# Patient Record
Sex: Female | Born: 1940
Health system: Southern US, Community
[De-identification: ages and names within clinical notes are randomized; demographics above are authoritative.]

## PROBLEM LIST (undated history)

## (undated) DIAGNOSIS — Z8719 Personal history of other diseases of the digestive system: Secondary | ICD-10-CM

## (undated) DIAGNOSIS — J189 Pneumonia, unspecified organism: Secondary | ICD-10-CM

## (undated) DIAGNOSIS — G43909 Migraine, unspecified, not intractable, without status migrainosus: Secondary | ICD-10-CM

## (undated) DIAGNOSIS — M5430 Sciatica, unspecified side: Secondary | ICD-10-CM

## (undated) DIAGNOSIS — R232 Flushing: Secondary | ICD-10-CM

## (undated) DIAGNOSIS — I2510 Atherosclerotic heart disease of native coronary artery without angina pectoris: Secondary | ICD-10-CM

## (undated) DIAGNOSIS — T8859XA Other complications of anesthesia, initial encounter: Secondary | ICD-10-CM

## (undated) DIAGNOSIS — T4145XA Adverse effect of unspecified anesthetic, initial encounter: Secondary | ICD-10-CM

## (undated) DIAGNOSIS — R011 Cardiac murmur, unspecified: Secondary | ICD-10-CM

## (undated) DIAGNOSIS — E162 Hypoglycemia, unspecified: Secondary | ICD-10-CM

## (undated) DIAGNOSIS — I10 Essential (primary) hypertension: Secondary | ICD-10-CM

## (undated) DIAGNOSIS — J45909 Unspecified asthma, uncomplicated: Secondary | ICD-10-CM

## (undated) DIAGNOSIS — E7800 Pure hypercholesterolemia, unspecified: Secondary | ICD-10-CM

## (undated) HISTORY — PX: CORONARY ARTERY BYPASS GRAFT: SHX141

## (undated) HISTORY — PX: SKIN LESION EXCISION: SHX2412

## (undated) HISTORY — PX: TONSILLECTOMY: SUR1361

## (undated) HISTORY — PX: BREAST BIOPSY: SHX20

---

## 1968-07-14 HISTORY — PX: APPENDECTOMY: SHX54

## 1995-07-15 HISTORY — PX: CORONARY ANGIOPLASTY WITH STENT PLACEMENT: SHX49

## 2005-08-18 ENCOUNTER — Encounter: Admission: RE | Admit: 2005-08-18 | Discharge: 2005-08-18 | Payer: Self-pay | Admitting: Internal Medicine

## 2005-10-23 ENCOUNTER — Other Ambulatory Visit: Admission: RE | Admit: 2005-10-23 | Discharge: 2005-10-23 | Payer: Self-pay | Admitting: Internal Medicine

## 2006-11-25 ENCOUNTER — Encounter: Admission: RE | Admit: 2006-11-25 | Discharge: 2006-11-25 | Payer: Self-pay | Admitting: Internal Medicine

## 2007-11-08 ENCOUNTER — Other Ambulatory Visit: Admission: RE | Admit: 2007-11-08 | Discharge: 2007-11-08 | Payer: Self-pay | Admitting: Internal Medicine

## 2007-12-28 ENCOUNTER — Encounter: Admission: RE | Admit: 2007-12-28 | Discharge: 2007-12-28 | Payer: Self-pay | Admitting: Internal Medicine

## 2009-01-10 ENCOUNTER — Encounter: Admission: RE | Admit: 2009-01-10 | Discharge: 2009-01-10 | Payer: Self-pay | Admitting: Internal Medicine

## 2009-11-07 ENCOUNTER — Other Ambulatory Visit: Admission: RE | Admit: 2009-11-07 | Discharge: 2009-11-07 | Payer: Self-pay | Admitting: Internal Medicine

## 2010-02-26 ENCOUNTER — Encounter: Admission: RE | Admit: 2010-02-26 | Discharge: 2010-02-26 | Payer: Self-pay | Admitting: Internal Medicine

## 2011-03-25 ENCOUNTER — Other Ambulatory Visit: Payer: Self-pay | Admitting: Internal Medicine

## 2011-03-25 DIAGNOSIS — Z1231 Encounter for screening mammogram for malignant neoplasm of breast: Secondary | ICD-10-CM

## 2011-04-17 ENCOUNTER — Ambulatory Visit
Admission: RE | Admit: 2011-04-17 | Discharge: 2011-04-17 | Disposition: A | Discharge status: Home / Self Care | Payer: 59 | Source: Ambulatory Visit | Attending: Internal Medicine | Admitting: Internal Medicine

## 2011-04-17 DIAGNOSIS — Z1231 Encounter for screening mammogram for malignant neoplasm of breast: Secondary | ICD-10-CM

## 2012-05-07 ENCOUNTER — Other Ambulatory Visit: Payer: Self-pay | Admitting: Internal Medicine

## 2012-05-07 DIAGNOSIS — Z1231 Encounter for screening mammogram for malignant neoplasm of breast: Secondary | ICD-10-CM

## 2012-05-17 ENCOUNTER — Other Ambulatory Visit (HOSPITAL_COMMUNITY): Payer: Self-pay | Admitting: Internal Medicine

## 2012-05-17 DIAGNOSIS — J45909 Unspecified asthma, uncomplicated: Secondary | ICD-10-CM

## 2012-05-21 ENCOUNTER — Ambulatory Visit (HOSPITAL_COMMUNITY)
Admission: RE | Admit: 2012-05-21 | Discharge: 2012-05-21 | Disposition: A | Discharge status: Home / Self Care | Payer: 59 | Source: Ambulatory Visit | Attending: Internal Medicine | Admitting: Internal Medicine

## 2012-05-21 DIAGNOSIS — J45909 Unspecified asthma, uncomplicated: Secondary | ICD-10-CM | POA: Insufficient documentation

## 2012-05-21 MED ORDER — ALBUTEROL SULFATE (5 MG/ML) 0.5% IN NEBU
2.5000 mg | INHALATION_SOLUTION | Freq: Once | RESPIRATORY_TRACT | Status: AC
  Administered 2012-05-21: 2.5 mg via RESPIRATORY_TRACT

## 2012-06-16 ENCOUNTER — Ambulatory Visit: Payer: 59

## 2012-07-28 ENCOUNTER — Ambulatory Visit: Payer: 59

## 2012-08-09 ENCOUNTER — Ambulatory Visit
Admission: RE | Admit: 2012-08-09 | Discharge: 2012-08-09 | Disposition: A | Discharge status: Home / Self Care | Payer: Medicare Other | Source: Ambulatory Visit | Attending: Internal Medicine | Admitting: Internal Medicine

## 2012-08-09 DIAGNOSIS — Z1231 Encounter for screening mammogram for malignant neoplasm of breast: Secondary | ICD-10-CM

## 2012-12-12 DIAGNOSIS — M5430 Sciatica, unspecified side: Secondary | ICD-10-CM

## 2012-12-12 HISTORY — DX: Sciatica, unspecified side: M54.30

## 2013-01-19 ENCOUNTER — Other Ambulatory Visit: Payer: Self-pay | Admitting: Internal Medicine

## 2013-01-19 DIAGNOSIS — R630 Anorexia: Secondary | ICD-10-CM

## 2013-01-19 DIAGNOSIS — R112 Nausea with vomiting, unspecified: Secondary | ICD-10-CM

## 2013-01-24 ENCOUNTER — Ambulatory Visit
Admission: RE | Admit: 2013-01-24 | Discharge: 2013-01-24 | Disposition: A | Discharge status: Home / Self Care | Payer: Medicare Other | Source: Ambulatory Visit | Attending: Internal Medicine | Admitting: Internal Medicine

## 2013-01-24 DIAGNOSIS — R630 Anorexia: Secondary | ICD-10-CM

## 2013-01-24 DIAGNOSIS — R112 Nausea with vomiting, unspecified: Secondary | ICD-10-CM

## 2013-02-01 ENCOUNTER — Encounter (HOSPITAL_COMMUNITY): Payer: Self-pay | Admitting: General Practice

## 2013-02-01 ENCOUNTER — Other Ambulatory Visit: Payer: Self-pay | Admitting: Internal Medicine

## 2013-02-01 ENCOUNTER — Inpatient Hospital Stay (HOSPITAL_COMMUNITY)
Admission: AD | Admit: 2013-02-01 | Discharge: 2013-02-03 | DRG: 384 | Disposition: A | Discharge status: Home / Self Care | Payer: Medicare Other | Source: Ambulatory Visit | Attending: Internal Medicine | Admitting: Internal Medicine

## 2013-02-01 ENCOUNTER — Ambulatory Visit
Admission: RE | Admit: 2013-02-01 | Discharge: 2013-02-01 | Disposition: A | Discharge status: Home / Self Care | Payer: Medicare Other | Source: Ambulatory Visit | Attending: Internal Medicine | Admitting: Internal Medicine

## 2013-02-01 DIAGNOSIS — Z79899 Other long term (current) drug therapy: Secondary | ICD-10-CM

## 2013-02-01 DIAGNOSIS — Z7982 Long term (current) use of aspirin: Secondary | ICD-10-CM

## 2013-02-01 DIAGNOSIS — D649 Anemia, unspecified: Secondary | ICD-10-CM | POA: Diagnosis present

## 2013-02-01 DIAGNOSIS — R1013 Epigastric pain: Secondary | ICD-10-CM | POA: Diagnosis present

## 2013-02-01 DIAGNOSIS — E785 Hyperlipidemia, unspecified: Secondary | ICD-10-CM | POA: Diagnosis present

## 2013-02-01 DIAGNOSIS — R1011 Right upper quadrant pain: Secondary | ICD-10-CM

## 2013-02-01 DIAGNOSIS — N182 Chronic kidney disease, stage 2 (mild): Secondary | ICD-10-CM | POA: Diagnosis present

## 2013-02-01 DIAGNOSIS — I10 Essential (primary) hypertension: Secondary | ICD-10-CM | POA: Diagnosis present

## 2013-02-01 DIAGNOSIS — I2510 Atherosclerotic heart disease of native coronary artery without angina pectoris: Secondary | ICD-10-CM | POA: Diagnosis present

## 2013-02-01 DIAGNOSIS — M899 Disorder of bone, unspecified: Secondary | ICD-10-CM

## 2013-02-01 DIAGNOSIS — E440 Moderate protein-calorie malnutrition: Secondary | ICD-10-CM | POA: Diagnosis present

## 2013-02-01 DIAGNOSIS — M8580 Other specified disorders of bone density and structure, unspecified site: Secondary | ICD-10-CM | POA: Diagnosis present

## 2013-02-01 DIAGNOSIS — I129 Hypertensive chronic kidney disease with stage 1 through stage 4 chronic kidney disease, or unspecified chronic kidney disease: Secondary | ICD-10-CM | POA: Diagnosis present

## 2013-02-01 DIAGNOSIS — J45909 Unspecified asthma, uncomplicated: Secondary | ICD-10-CM | POA: Diagnosis present

## 2013-02-01 DIAGNOSIS — K269 Duodenal ulcer, unspecified as acute or chronic, without hemorrhage or perforation: Principal | ICD-10-CM

## 2013-02-01 DIAGNOSIS — Z951 Presence of aortocoronary bypass graft: Secondary | ICD-10-CM

## 2013-02-01 DIAGNOSIS — IMO0002 Reserved for concepts with insufficient information to code with codable children: Secondary | ICD-10-CM

## 2013-02-01 HISTORY — DX: Personal history of other diseases of the digestive system: Z87.19

## 2013-02-01 HISTORY — DX: Other complications of anesthesia, initial encounter: T88.59XA

## 2013-02-01 HISTORY — DX: Cardiac murmur, unspecified: R01.1

## 2013-02-01 HISTORY — DX: Unspecified asthma, uncomplicated: J45.909

## 2013-02-01 HISTORY — DX: Pure hypercholesterolemia, unspecified: E78.00

## 2013-02-01 HISTORY — DX: Atherosclerotic heart disease of native coronary artery without angina pectoris: I25.10

## 2013-02-01 HISTORY — DX: Hypoglycemia, unspecified: E16.2

## 2013-02-01 HISTORY — DX: Pneumonia, unspecified organism: J18.9

## 2013-02-01 HISTORY — DX: Adverse effect of unspecified anesthetic, initial encounter: T41.45XA

## 2013-02-01 HISTORY — DX: Essential (primary) hypertension: I10

## 2013-02-01 HISTORY — DX: Sciatica, unspecified side: M54.30

## 2013-02-01 MED ORDER — ALBUTEROL SULFATE HFA 108 (90 BASE) MCG/ACT IN AERS
1.0000 | INHALATION_SPRAY | Freq: Four times a day (QID) | RESPIRATORY_TRACT | Status: DC | PRN
  Filled 2013-02-01: qty 6.7

## 2013-02-01 MED ORDER — SIMVASTATIN 40 MG PO TABS
40.0000 mg | ORAL_TABLET | Freq: Every day | ORAL | Status: DC
  Administered 2013-02-02: 40 mg via ORAL
  Filled 2013-02-01 (×3): qty 1

## 2013-02-01 MED ORDER — ATENOLOL 50 MG PO TABS
50.0000 mg | ORAL_TABLET | Freq: Every day | ORAL | Status: DC
  Administered 2013-02-01 – 2013-02-02 (×2): 50 mg via ORAL
  Filled 2013-02-01 (×4): qty 1

## 2013-02-01 MED ORDER — ACETAMINOPHEN 650 MG RE SUPP: 650.0000 mg | Freq: Four times a day (QID) | RECTAL | Status: DC | PRN

## 2013-02-01 MED ORDER — HEPARIN SODIUM (PORCINE) 5000 UNIT/ML IJ SOLN
5000.0000 [IU] | Freq: Three times a day (TID) | INTRAMUSCULAR | Status: DC
  Administered 2013-02-01 – 2013-02-02 (×2): 5000 [IU] via SUBCUTANEOUS
  Filled 2013-02-01 (×3): qty 1

## 2013-02-01 MED ORDER — IOHEXOL 300 MG/ML  SOLN: 100.0000 mL | Freq: Once | INTRAMUSCULAR | Status: AC | PRN

## 2013-02-01 MED ORDER — ONDANSETRON HCL 4 MG/2ML IJ SOLN
4.0000 mg | Freq: Four times a day (QID) | INTRAMUSCULAR | Status: DC | PRN
  Administered 2013-02-01: 4 mg via INTRAVENOUS
  Filled 2013-02-01: qty 2

## 2013-02-01 MED ORDER — ASPIRIN 325 MG PO TABS
325.0000 mg | ORAL_TABLET | Freq: Every day | ORAL | Status: DC
  Filled 2013-02-01: qty 1

## 2013-02-01 MED ORDER — SODIUM CHLORIDE 0.9 % IV SOLN
INTRAVENOUS | Status: DC
  Administered 2013-02-01: 19:00:00 via INTRAVENOUS
  Administered 2013-02-02: 75 mL/h via INTRAVENOUS
  Administered 2013-02-02: 06:00:00 via INTRAVENOUS

## 2013-02-01 MED ORDER — ACETAMINOPHEN 325 MG PO TABS: 650.0000 mg | ORAL_TABLET | Freq: Four times a day (QID) | ORAL | Status: DC | PRN

## 2013-02-01 MED ORDER — LOSARTAN POTASSIUM 50 MG PO TABS
50.0000 mg | ORAL_TABLET | Freq: Every day | ORAL | Status: DC
  Administered 2013-02-03: 50 mg via ORAL
  Filled 2013-02-01 (×2): qty 1

## 2013-02-01 MED ORDER — SODIUM CHLORIDE 0.9 % IJ SOLN: 3.0000 mL | Freq: Two times a day (BID) | INTRAMUSCULAR | Status: DC

## 2013-02-01 NOTE — Progress Notes (Signed)
Admitted to room 6n11 for office, A/ox4, oriented to room and surroundings, husband at bedside, doctor notified of arrival

## 2013-02-01 NOTE — H&P (Addendum)
[facility] History and Physical  [name] XBJ:[id number] DOB: [birth year] DOA: 02/01/2013  Referring physician: Dr. Donette Larry PCP: Georgann Housekeeper, MD  Specialists: None  Chief Complaint: Abdominal pain  HPI: Melissa Elliott is a 72 y.o. female has a past medical history significant for coronary artery disease status post 2 vessel bypass surgery in 1996, hypertension, hyperlipidemia, cervical disc disease, allergic rhinitis and mild asthma, osteopenia, CK-MB stage II mild, vitamin D deficiency presented to her PCPs office today with a chief complaint of epigastric discomfort and abdominal pain, on and off, associated with nausea, for the past 4 weeks. She states that her symptoms started when she traveled about 4 weeks ago. She was feeling poorly at that time, has been through urgent care in [place]. [place] and the one time she was found to be dehydrated and given IV fluids, and a second time she was told she has a urinary tract infections and was prescribed ciprofloxacin. Her symptoms have been persistent, she endorses poor by mouth intake, and a 7 pound associated weight loss in the past 4 weeks. Her pain is "crampy" in nature, now in particular relationship with food, it's in the mid epigastric area and feels that sometimes it is in the bilateral lower quadrants, "bandlike". Her abdominal pain it radiates into her back. Denies any fever or chills, however endorses she feels hot and cold all the time. Denies any diarrhea, endorses however constipation. Last bowel movement was yesterday. She denies any blood in the stool or blood in the urine. Denies lightheadedness and dizziness, just feels "weak".    Review of Systems: As per history of present illness, otherwise negative.  Past Medical History  Diagnosis Date  . Coronary artery disease   . Complication of anesthesia     "takes a long time to wakeup" (02/01/2013)  . High cholesterol     "on RX because of the bypass" (02/01/2013)  .  Hypertension   . Heart murmur   . Asthma   . Pneumonia ? 1970's    "once" (02/01/2013)  . Hypoglycemia     "that's something I have to watch constantly" (02/01/2013)  . H/O hiatal hernia   . Sciatic nerve pain 12/2012    "RLE" (02/01/2013)   Past Surgical History  Procedure Laterality Date  . Coronary artery bypass graft  ~ 1997    CABG X2  . Tonsillectomy  1940's  . Appendectomy  1970  . Breast lumpectomy Left ~ 1975  . Skin lesion excision Left 1960's    "upper arm; sore that never healed" (02/01/2013)   Social History:  reports that she has never smoked. She has never used smokeless tobacco. She reports that  drinks alcohol. She reports that she does not use illicit drugs.  Allergies  Allergen Reactions  . Ciprofloxacin Other (See Comments)    "dry heaves"  . Sulphadimidine (Sulfamethazine) Swelling  . Iodine Hives  . Eggs Or Egg-Derived Products Nausea And Vomiting    "sometimes I can eat eggs; sometimes I can't"  . Penicillins Rash    unknown    Family history Father deceased at 94 years old, MI Mother deceased at 21 years old, coronary artery disease, CABG 1 alive sister  Prior to Admission medications   Not on File  Multivitamin one tablet daily Calcium supplements 1 tablet daily Vitamin D 2000 units 1 tablet daily Albuterol 1 puff as needed Atenolol 50 mg daily Losartan 50 mg daily Aspirin 325 mg daily Nitroglycerin 0.4 mg as needed for chest pain Simvastatin  40 mg daily  Physical Exam: Filed Vitals:   02/01/13 1721  BP: 189/79  Pulse: 85  Temp: 97.8 F (36.6 C)  TempSrc: Oral  Resp: 17  SpO2: 99%     General:  No apparent distress  Eyes: PERRL, EOMI, no scleral icterus  ENT: moist oropharynx  Neck: supple, no JVD  Cardiovascular: regular rate without MRG; 2+ peripheral pulses  Respiratory: CTA biL, good air movement without wheezing, rhonchi or crackled  Abdomen: soft, non tender to palpation, positive bowel sounds, no guarding, no  rebound  Skin: no rashes  Musculoskeletal: no peripheral edema  Psychiatric: normal mood and affect  Neurologic: CN 2-12 grossly intact, MS 5/5 in all 4  Labs in PCP office 7/21  Blood work done yesterday 7/21 2014 showed a white count of 8.4, hemoglobin 11.4, platelets 430 Urinalysis with few bacteria, few epithelial cells, 5-20 WBC, trace leukoesterase, negative nitrates BNP with BUN 13, creatinine 1.27 (GFR 42), sodium 139, potassium 3.3, chloride 98, bicarbonate 32. Calcium 9.1 Liver enzymes AST 10, ALT 7, alkaline phosphatase 67, total bilirubin 0.3, albumin 2.7 Lipase 44  Radiological Exams on Admission: US Abdomen Limited Ruq  02/01/2013   *RADIOLOGY REPORT*  Clinical Data:  Right upper quadrant pain.  LIMITED ABDOMINAL ULTRASOUND - RIGHT UPPER QUADRANT  Comparison:  None.  Findings:  Gallbladder:  No stones or wall thickening.  Negative sonographic Murphy's.  Common bile duct:  Upper limits normal in caliber at 6 mm.  No visible filling defects.  Liver:  Normal size and echotexture.  No focal abnormality.  No biliary ductal dilatation.  IMPRESSION: Unremarkable study.                    Original Report Authenticated By: Charlett Nose, M.D.    EKG: Pending  Assessment/Plan Active Problems:   Abdominal pain, epigastric   Hx of CABG   HTN (hypertension)   HLD (hyperlipidemia)   Osteopenia    Abdominal pain - unrevealing workup so far. Patient underwent a right upper quadrant ultrasound which was negative for cholelithiasis or any other pathology. Blood work shows normal liver function tests, with a normal lipase. We'll pursue with a CT abdomen and pelvis with oral contrast; patient has mild CKD as well as hives with iodine and would like to avoid contrast if possible. IV fluids, n.p.o. and advance diet as tolerated. - Consider GI consult if CT is negative as well. Hypertension - restart medications Hyperlipidemia - restart home medications History of CABG - on telemetry DVT  prophylaxis - heparin subcutaneous   Code Status: Presumed full  Family Communication: Husband at bedside  Disposition Plan: Inpatient  Time spent: 61  Costin M. Elvera Lennox, MD [facility] Pager [phone]  If 7PM-7AM, please contact night-coverage [email or web address] Password [facility] 02/01/2013, 6:08 PM

## 2013-02-02 ENCOUNTER — Encounter (HOSPITAL_COMMUNITY): Payer: Self-pay | Admitting: Radiology

## 2013-02-02 ENCOUNTER — Encounter (HOSPITAL_COMMUNITY)
Admission: AD | Disposition: A | Discharge status: Home / Self Care | Payer: Self-pay | Source: Ambulatory Visit | Attending: Internal Medicine

## 2013-02-02 ENCOUNTER — Inpatient Hospital Stay (HOSPITAL_COMMUNITY): Payer: Medicare Other

## 2013-02-02 DIAGNOSIS — K269 Duodenal ulcer, unspecified as acute or chronic, without hemorrhage or perforation: Secondary | ICD-10-CM

## 2013-02-02 HISTORY — PX: ESOPHAGOGASTRODUODENOSCOPY: SHX5428

## 2013-02-02 LAB — CBC
HCT: 29.1 % — ABNORMAL LOW (ref 36.0–46.0)
Hemoglobin: 9.5 g/dL — ABNORMAL LOW (ref 12.0–15.0)
MCHC: 32.6 g/dL (ref 30.0–36.0)

## 2013-02-02 LAB — COMPREHENSIVE METABOLIC PANEL
ALT: 6 U/L (ref 0–35)
Calcium: 8.8 mg/dL (ref 8.4–10.5)
GFR calc Af Amer: 69 mL/min — ABNORMAL LOW (ref >90)
Glucose, Bld: 119 mg/dL — ABNORMAL HIGH (ref 70–99)
Sodium: 139 mEq/L (ref 135–145)
Total Protein: 5.6 g/dL — ABNORMAL LOW (ref 6.0–8.3)

## 2013-02-02 SURGERY — EGD (ESOPHAGOGASTRODUODENOSCOPY)
Anesthesia: Moderate Sedation

## 2013-02-02 MED ORDER — PANTOPRAZOLE SODIUM 40 MG IV SOLR
40.0000 mg | Freq: Two times a day (BID) | INTRAVENOUS | Status: DC
  Administered 2013-02-02 (×2): 40 mg via INTRAVENOUS
  Filled 2013-02-02 (×4): qty 40

## 2013-02-02 MED ORDER — MIDAZOLAM HCL 5 MG/ML IJ SOLN
INTRAMUSCULAR | Status: AC
  Filled 2013-02-02: qty 2

## 2013-02-02 MED ORDER — MIDAZOLAM HCL 10 MG/2ML IJ SOLN
INTRAMUSCULAR | Status: DC | PRN
  Administered 2013-02-02: 2 mg via INTRAVENOUS
  Administered 2013-02-02: 1 mg via INTRAVENOUS

## 2013-02-02 MED ORDER — DIPHENHYDRAMINE HCL 50 MG/ML IJ SOLN
INTRAMUSCULAR | Status: AC
  Filled 2013-02-02: qty 1

## 2013-02-02 MED ORDER — FENTANYL CITRATE 0.05 MG/ML IJ SOLN
INTRAMUSCULAR | Status: AC
  Filled 2013-02-02: qty 4

## 2013-02-02 MED ORDER — BOOST / RESOURCE BREEZE PO LIQD
1.0000 | Freq: Every day | ORAL | Status: DC | PRN
  Filled 2013-02-02: qty 1

## 2013-02-02 MED ORDER — FENTANYL CITRATE 0.05 MG/ML IJ SOLN
INTRAMUSCULAR | Status: DC | PRN
  Administered 2013-02-02 (×2): 25 ug via INTRAVENOUS

## 2013-02-02 MED ORDER — SODIUM CHLORIDE 0.9 % IV SOLN
INTRAVENOUS | Status: DC
  Administered 2013-02-02: 500 mL via INTRAVENOUS

## 2013-02-02 MED ORDER — SODIUM CHLORIDE 0.9 % IV SOLN: Freq: Once | INTRAVENOUS | Status: DC

## 2013-02-02 MED ORDER — SODIUM CHLORIDE 0.9 % IV SOLN
INTRAVENOUS | Status: DC
  Administered 2013-02-02: 50 mL/h via INTRAVENOUS

## 2013-02-02 MED ORDER — BUTAMBEN-TETRACAINE-BENZOCAINE 2-2-14 % EX AERO
INHALATION_SPRAY | CUTANEOUS | Status: DC | PRN
  Administered 2013-02-02: 2 via TOPICAL

## 2013-02-02 NOTE — Op Note (Addendum)
[facility] [address] [place], [address]   ENDOSCOPY PROCEDURE REPORT  PATIENT: Melissa, Elliott  MR#: [id number] BIRTHDATE: [birth year] , 71  yrs. old GENDER: Female ENDOSCOPIST: Wandalee Ferdinand, MD REFERRED BY: PROCEDURE DATE:  02/02/2013 PROCEDURE:   EGD with bx ASA CLASS: 3 INDICATIONS: epigastric pain MEDICATIONS: Fentanyl 50 micro grams iv, Versed 3 mg iv TOPICAL ANESTHETIC: Cetacaine spray  DESCRIPTION OF PROCEDURE:   After the risks benefits and alternatives of the procedure were thoroughly explained, informed consent was obtained.  The Pentax Gastroscope [id number]  endoscope was introduced through the mouth and advanced to the duodenum , limited by Without limitations.   The instrument was slowly withdrawn as the mucosa was fully examined.      FINDINGS:  Esophagus: Normal  Stomach: Small hiatal hernia otherwise normal  Duodenum: 7mm ulcer in the bulb seen as soon as scope entered the bulb see image 003. Surrounding edematous mucosa. Just past this smaller ulcer was a large ulcer (image 004), but there was partial obstruction of the bulb and all of this larger ulcer could not be seen. The scope could not be advanced into the second portion of the duodenum.  Biopsies of duodenum and stomach obtained.  COMPLICATIONS: None  ENDOSCOPIC IMPRESSION: See above   RECOMMENDATIONS: PPI therapy. Check bx for H pylori.      _______________________________ Rosalie DoctorWandalee Ferdinand, MD 02/02/2013 12:35 PM

## 2013-02-02 NOTE — Progress Notes (Signed)
Patient more awake. Preparing to eat dinner. Denies any pain /nausea.

## 2013-02-02 NOTE — Progress Notes (Signed)
INITIAL NUTRITION ASSESSMENT  DOCUMENTATION CODES Per approved criteria  -Non-severe (moderate) malnutrition in the context of acute illness or injury   INTERVENTION:  Resource Breeze daily PRN (250 kcals, 9 gm protein per 8 fl oz carton) RD to follow for nutrition care plan  NUTRITION DIAGNOSIS: Inadequate oral intake related to abdominal pain, poor appetite as evidenced by patient report  Goal: Oral intake with meals & supplements to meet >/= 90% of estimated nutrition needs  Monitor:  PO & supplemental intake, weight, labs, I/O's  Reason for Assessment: Malnutrition Screening Tool Report  72 y.o. female  Admitting Dx: Abdominal pain, epigastric  ASSESSMENT: Patient admitted to the hospital because of a one-month history of ongoing intermittent epigastric abdominal pain; CT scan of the abdomen was done which showed some thickening in the proximal duodenum which could be peptic in nature.   Patient reports a poor appetite x 1 month; reports she would only consume ~ 1 meal per day; she would go ahead and eat her "safe" foods (shakes, pudding); she also states she's lost 7 lbs in this time frame (5%) ---> for EGD today to further evaluate; she is agreeable to Resource Breeze PRN.  Patient meets criteria for non-severe (moderate) malnutrition in the context of acute illness or injury as evidenced by < 75% intake of estimated energy requirement for > 7 days and 5% weight loss x 1 month.  Height: Ht Readings from Last 1 Encounters:  02/01/13 5\' 4"  (1.626 m)    Weight: Wt Readings from Last 1 Encounters:  02/01/13 123 lb (55.792 kg)    Ideal Body Weight: 120 lb  % Ideal Body Weight: 102%  Wt Readings from Last 10 Encounters:  02/01/13 123 lb (55.792 kg)  02/01/13 123 lb (55.792 kg)    Usual Body Weight: ~ 130 lb  % Usual Body Weight: 95%  BMI:  Body mass index is 21.1 kg/(m^2).  Estimated Nutritional Needs: Kcal: 1500-1700 Protein: 70-80 gm Fluid: 1.5-1.7  L  Skin: Intact  Diet Order: Clear Liquid  EDUCATION NEEDS: -No education needs identified at this time   Intake/Output Summary (Last 24 hours) at 02/02/13 1129 Last data filed at 02/02/13 0500  Gross per 24 hour  Intake 996.67 ml  Output      0 ml  Net 996.67 ml    Labs:   Recent Labs Lab 02/02/13 0440  NA 139  K 4.5  CL 104  CO2 28  BUN 9  CREATININE 0.95  CALCIUM 8.8  GLUCOSE 119*    Scheduled Meds: . [facility] HOLD] atenolol  50 mg Oral Daily  . [MAR HOLD] losartan  50 mg Oral Daily  . [MAR HOLD] pantoprazole (PROTONIX) IV  40 mg Intravenous Q12H  . [facility] HOLD] simvastatin  40 mg Oral q1800  . [facility] HOLD] sodium chloride  3 mL Intravenous Q12H    Continuous Infusions: . sodium chloride 75 mL/hr at 02/02/13 4010    Past Medical History  Diagnosis Date  . Coronary artery disease   . Complication of anesthesia     "takes a long time to wakeup" (02/01/2013)  . High cholesterol     "on RX because of the bypass" (02/01/2013)  . Hypertension   . Heart murmur   . Asthma   . Pneumonia ? 1970's    "once" (02/01/2013)  . Hypoglycemia     "that's something I have to watch constantly" (02/01/2013)  . H/O hiatal hernia   . Sciatic nerve pain 12/2012    "  RLE" (02/01/2013)    Past Surgical History  Procedure Laterality Date  . Coronary artery bypass graft  ~ 1997    CABG X2  . Tonsillectomy  1940's  . Appendectomy  1970  . Breast lumpectomy Left ~ 1975  . Skin lesion excision Left 1960's    "upper arm; sore that never healed" (02/01/2013)    Maureen Chatters, RD, LDN Pager #: [phone] After-Hours Pager #: [phone]

## 2013-02-02 NOTE — Progress Notes (Signed)
UR completed 

## 2013-02-02 NOTE — Progress Notes (Signed)
Subjective: Pt with upper Abdomen pain, nausea  Objective: Vital signs in last 24 hours: Temp:  [97.8 F (36.6 C)-98.5 F (36.9 C)] 98.5 F (36.9 C) (07/23 0500) Pulse Rate:  [69-91] 69 (07/23 0500) Resp:  [17] 17 (07/23 0500) BP: (126-189)/(68-79) 126/75 mmHg (07/23 0500) SpO2:  [94 %-99 %] 94 % (07/23 0500) Weight:  [55.792 kg (123 lb)] 55.792 kg (123 lb) (07/22 1816) Weight change:  Last BM Date: 01/31/13  Intake/Output from previous day: 07/22 0701 - 07/23 0700 In: 996.7 [I.V.:996.7] Out: -  Intake/Output this shift:    General appearance: alert Resp: clear to auscultation bilaterally Cardio: regular rate and rhythm GI: abnormal findings:  epigastric tenderness.  Lab Results:  Recent Labs  02/02/13 0440  WBC 9.1  HGB 9.5*  HCT 29.1*  PLT 374   BMET  Recent Labs  02/02/13 0440  NA 139  K 4.5  CL 104  CO2 28  GLUCOSE 119*  BUN 9  CREATININE 0.95  CALCIUM 8.8    Studies/Results: Ct Abdomen Pelvis Wo Contrast  02/02/2013   *RADIOLOGY REPORT*  Clinical Data: Diffuse abdominal pain for 4 weeks.  I am allergy.  CT ABDOMEN AND PELVIS WITHOUT CONTRAST  Technique:  Multidetector CT imaging of the abdomen and pelvis was performed following the standard protocol without intravenous contrast.  Comparison: Ultrasound abdomen 02/01/2013  Findings: Emphysematous changes and fibrosis in the lung bases. Calcification of the mitral valve annulus.  Postoperative changes in the mediastinum.  Calcified granulomas in the spleen.  There is thickening of the wall of the duodenum and duodenal bulb region with focal contrast collection suggesting ulcer. Mild fullness of the adjacent head of the pancreas with loss of fat planes between the pancreas of the duodenum.  This is likely secondary inflammation.  Focal pancreatitis or duodenitis are not excluded.  Mass lesion felt to be less likely.  No pancreatic ductal dilatation.  No bile duct dilatation.  Consider endoscopy for further  evaluation if clinically indicated.  The unenhanced appearance of the liver, gallbladder, adrenal glands, kidneys, inferior vena cava, and retroperitoneal lymph nodes is unremarkable.  Calcification of the abdominal aorta without aneurysm.  The gastric wall does not appeared thickened. Small bowel are not distended.  Contrast material flows to the rectum without evidence of bowel obstruction.  No colonic wall thickening or distension.  No free air or free fluid in the abdomen.  Abdominal wall musculature appears intact.  Pelvis:  Uterus and ovaries are not enlarged.  No bladder wall thickening.  No free or loculated pelvic fluid collections.  No diverticulitis.  Appendix is normal.  No significant pelvic lymphadenopathy.  Normal alignment of the lumbar spine.  IMPRESSION: Suggestion of thickening and inflammatory process involving the proximal duodenum probably due to peptic ulcer disease.  Focal pancreatitis or duodenitis not excluded.  Mass lesion less likely.   Original Report Authenticated By: Burman Nieves, M.D.   US Abdomen Limited Ruq  02/01/2013   *RADIOLOGY REPORT*  Clinical Data:  Right upper quadrant pain.  LIMITED ABDOMINAL ULTRASOUND - RIGHT UPPER QUADRANT  Comparison:  None.  Findings:  Gallbladder:  No stones or wall thickening.  Negative sonographic Murphy's.  Common bile duct:  Upper limits normal in caliber at 6 mm.  No visible filling defects.  Liver:  Normal size and echotexture.  No focal abnormality.  No biliary ductal dilatation.  IMPRESSION: Unremarkable study.  Original Report Authenticated By: Charlett Nose, M.D.    Medications: I have reviewed the patient's current medications.  Assessment/Plan: Abdominal pain/ Nausea- UGI and u/s out pt negative- worsing pain with anemia CT scan- Imlafmatory Change - proable- DU/ deudinitis/ PUD- less likely mass; lab normal- lipase and LFT- out pt normal Pt was on Mobic and ASA   Plan: d/c ASA/ GI - for EGD Start on  [employer] today. Anemia: low Bleed- Repeat CBC HTN/ CAD/ BP med    LOS: 1 day   Verdean Murin 02/02/2013, 7:33 AM

## 2013-02-02 NOTE — Progress Notes (Signed)
Patient drowsy since endoscopy earlier. Arouses to voice and gentle shake. Vital signs stable. No apparent distress. Husband at bedside.RN spoke with Dr. Evette Cristal. Discharge order entered incorrectly.

## 2013-02-02 NOTE — Consult Note (Signed)
Subjective:   HPI  The patient is a 72 year old female who was admitted to the hospital because of a one-month history of ongoing intermittent epigastric abdominal pain. The pain seems to be worse after eating. She states that she has not tried any medication for this pain. An upper GI series showed a small hiatal hernia but otherwise was negative. A recent abdominal ultrasound was also negative and specifically no gallstones were seen. A CT scan of the abdomen was done which showed some thickening in the proximal duodenum which could be peptic in nature. We are asked to see her to further evaluate. She states that she had a colonoscopy done about 4 years ago and nothing serious was found. She states that she has not been feeling that well and has a poor appetite and also has lost about 7 pounds in the past month.  Review of Systems   Past Medical History  Diagnosis Date  . Coronary artery disease   . Complication of anesthesia     "takes a long time to wakeup" (02/01/2013)  . High cholesterol     "on RX because of the bypass" (02/01/2013)  . Hypertension   . Heart murmur   . Asthma   . Pneumonia ? 1970's    "once" (02/01/2013)  . Hypoglycemia     "that's something I have to watch constantly" (02/01/2013)  . H/O hiatal hernia   . Sciatic nerve pain 12/2012    "RLE" (02/01/2013)   Past Surgical History  Procedure Laterality Date  . Coronary artery bypass graft  ~ 1997    CABG X2  . Tonsillectomy  1940's  . Appendectomy  1970  . Breast lumpectomy Left ~ 1975  . Skin lesion excision Left 1960's    "upper arm; sore that never healed" (02/01/2013)   History   Social History  . Marital Status: Married    Spouse Name: N/A    Number of Children: N/A  . Years of Education: N/A   Occupational History  . Not on file.   Social History Main Topics  . Smoking status: Never Smoker   . Smokeless tobacco: Never Used  . Alcohol Use: Yes     Comment: 02/01/2013 "glass of wine maybe  once/month"  . Drug Use: No  . Sexually Active: Not Currently   Other Topics Concern  . Not on file   Social History Narrative  . No narrative on file   family history is not on file. Current facility-administered medications:0.9 %  sodium chloride infusion, , Intravenous, Continuous, Georgann Housekeeper, MD, Last Rate: 75 mL/hr at 02/02/13 0737;  acetaminophen (TYLENOL) suppository 650 mg, 650 mg, Rectal, Q6H PRN, Pamella Pert, MD;  acetaminophen (TYLENOL) tablet 650 mg, 650 mg, Oral, Q6H PRN, Pamella Pert, MD albuterol (PROVENTIL HFA;VENTOLIN HFA) 108 (90 BASE) MCG/ACT inhaler 1-2 puff, 1-2 puff, Inhalation, Q6H PRN, Pamella Pert, MD;  atenolol (TENORMIN) tablet 50 mg, 50 mg, Oral, Daily, Pamella Pert, MD, 50 mg at 02/01/13 2140;  losartan (COZAAR) tablet 50 mg, 50 mg, Oral, Daily, Pamella Pert, MD;  ondansetron (ZOFRAN) injection 4 mg, 4 mg, Intravenous, QID PRN, Marden Noble, MD, 4 mg at 02/01/13 2140 pantoprazole (PROTONIX) injection 40 mg, 40 mg, Intravenous, Q12H, Georgann Housekeeper, MD, 40 mg at 02/02/13 1004;  simvastatin (ZOCOR) tablet 40 mg, 40 mg, Oral, q1800, Costin Gherghe, MD;  sodium chloride 0.9 % injection 3 mL, 3 mL, Intravenous, Q12H, Pamella Pert, MD Allergies  Allergen Reactions  . Ciprofloxacin Other (See Comments)    "  dry heaves"  . Iodine Hives  . Sulfa Antibiotics   . Eggs Or Egg-Derived Products Nausea And Vomiting    "sometimes I can eat eggs; sometimes I can't"  . Penicillins Rash    unknown     Objective:     BP 126/75  Pulse 69  Temp(Src) 98.5 F (36.9 C) (Oral)  Resp 17  Ht 5\' 4"  (1.626 m)  Wt 55.792 kg (123 lb)  BMI 21.1 kg/m2  SpO2 94%  She is in no distress  Heart regular rhythm  Lungs clear  Abdomen: Bowel sounds normal, soft, mild tenderness in epigastric area  Laboratory No components found with this basename: d1      Assessment:     Epigastric pain      Plan:     We will go ahead and plan EGD today to further evaluate  the upper GI tract. Recommend PPI therapy

## 2013-02-03 ENCOUNTER — Encounter (HOSPITAL_COMMUNITY): Payer: Self-pay | Admitting: Gastroenterology

## 2013-02-03 LAB — CBC
HCT: 29.7 % — ABNORMAL LOW (ref 36.0–46.0)
MCH: 28.7 pg (ref 26.0–34.0)
MCHC: 33 g/dL (ref 30.0–36.0)
MCV: 86.8 fL (ref 78.0–100.0)
RDW: 13.5 % (ref 11.5–15.5)

## 2013-02-03 LAB — BASIC METABOLIC PANEL
BUN: 9 mg/dL (ref 6–23)
Calcium: 9 mg/dL (ref 8.4–10.5)
Chloride: 106 mEq/L (ref 96–112)
Creatinine, Ser: 0.97 mg/dL (ref 0.50–1.10)
GFR calc Af Amer: 67 mL/min — ABNORMAL LOW (ref >90)
GFR calc non Af Amer: 58 mL/min — ABNORMAL LOW (ref >90)

## 2013-02-03 MED ORDER — PANTOPRAZOLE SODIUM 40 MG PO TBEC: 40.0000 mg | DELAYED_RELEASE_TABLET | Freq: Two times a day (BID) | ORAL | Status: DC

## 2013-02-03 NOTE — Progress Notes (Signed)
Md on call notified that pt lung sound had crackles at the lower bases no shortness of breath or edema at this time new orders were received will continue to monitor. Ilean Skill LPN

## 2013-02-03 NOTE — Progress Notes (Signed)
DC home with husband, verbally understood dc instructions, no questions ask

## 2013-02-03 NOTE — Discharge Summary (Signed)
Physician Discharge Summary  Patient ID: Melissa Elliott MRN: [id number] DOB/AGE: April 15, 1941 72 y.o.  Admit date: 02/01/2013 Discharge date: 02/03/2013  Admission Diagnoses:  Discharge Diagnoses:  Principal Problem:   Abdominal pain, epigastric Duodenal ulcers Anemia Active Problems:   Hx of CABG   HTN (hypertension)   HLD (hyperlipidemia)   Osteopenia   Duodenal ulcer   Discharged Condition: good  Hospital Course: 72 years old female admitted with epigastric pain nausea worsening. Over the last 3 weeks. She was seen in the office with workup including upper GI study negative, ultrasound of the gallbladder negative patient continues to have worsening pain in the upper abdomen and the back with poor p.o. Intake and nausea and weakness. Recently treated for UTI. Problem #1: abdominal pain: patient underwent CT scan of the abdomen pelvic on admission. CT scan findings showed duodenal thickening possibly ulcer versus duodenitis. Otherwise CT scan was negative for abdomen and pelvic GI consult was obtained patient was started on IV fluids her blood counts showed normal white cell count hemoglobin of 9.5. Blood chemistries was normal.; She had no melena or black stools. Endoscopy was performed- it showed duodenal ulcer one small one in one large one with some mucosal swelling and some partial obstruction of the duodenum- biopsies obtained, H. Pylori obtained- results pending She was started on IV Protonix twice a day After the endoscopy patient did well no large amount abdominal pain she was started on full liquid diet tolerated that well Continue Protonix p.o. For minimum of 6 weeks Aspirin was discontinued Followup with GI for biopsy and if pylori results followup May need repeat endoscopy if appropriate per GI Problem #2 anemia patient's repeat hemoglobin was 9.8 the evidence of any active bleed. Problem #3 hypertension CAD continue blood pressure medications.; Aspirin be on hold for  next 4-6 weeks  Consults: GI  Significant Diagnostic Studies: labs: Tuberculate cracking of 0.97 BUN of 9 glucose 104 pressure 4.5 WBC count 7.2 hemoglobin 9.8 albumin 2.8 TSH 1.3, radiology: CT scan: lower lung scarring duodenal wall thickening otherwise negative and endoscopy: gastroscopy: duodenal ulcers  Treatments: IV hydration  Discharge Exam: Blood pressure 149/50, pulse 70, temperature 97.7 F (36.5 C), temperature source Oral, resp. rate 18, height 5\' 4"  (1.626 m), weight 55.792 kg (123 lb), SpO2 98.00%. General appearance: alert Resp: clear to auscultation bilaterally Cardio: regular rate and rhythm GI: soft, non-tender; bowel sounds normal; no masses,  no organomegaly  Disposition: home  Discharge Orders   Future Orders Complete By Expires     Diet - low sodium heart healthy  As directed     Increase activity slowly  As directed         Medication List    STOP taking these medications       aspirin EC 325 MG tablet      TAKE these medications       albuterol 108 (90 BASE) MCG/ACT inhaler  Commonly known as:  PROVENTIL HFA;VENTOLIN HFA  Inhale 2 puffs into the lungs every 6 (six) hours as needed for wheezing.     atenolol 50 MG tablet  Commonly known as:  TENORMIN  Take 50 mg by mouth daily.     losartan 50 MG tablet  Commonly known as:  COZAAR  Take 50 mg by mouth daily.     nitroGLYCERIN 0.4 MG SL tablet  Commonly known as:  NITROSTAT  Place 0.4 mg under the tongue every 5 (five) minutes as needed for chest pain.  pantoprazole 40 MG tablet  Commonly known as:  PROTONIX  Take 1 tablet (40 mg total) by mouth 2 (two) times daily.     simvastatin 40 MG tablet  Commonly known as:  ZOCOR  Take 40 mg by mouth every evening.           Follow-up Information   Follow up with Georgann Housekeeper, MD In 2 weeks.   Contact information:   [address] [place] [address] [phone]       Signed: Georgann Housekeeper 02/03/2013, 8:14  AM

## 2013-05-10 ENCOUNTER — Other Ambulatory Visit: Payer: Self-pay | Admitting: Gastroenterology

## 2013-06-18 ENCOUNTER — Encounter: Payer: Self-pay | Admitting: *Deleted

## 2013-06-18 ENCOUNTER — Encounter: Payer: Self-pay | Admitting: Interventional Cardiology

## 2013-06-20 ENCOUNTER — Ambulatory Visit (INDEPENDENT_AMBULATORY_CARE_PROVIDER_SITE_OTHER): Payer: Medicare Other | Admitting: Interventional Cardiology

## 2013-06-20 ENCOUNTER — Encounter: Payer: Self-pay | Admitting: Interventional Cardiology

## 2013-06-20 VITALS — BP 138/80 | HR 82 | Ht 64.0 in | Wt 128.0 lb

## 2013-06-20 DIAGNOSIS — I2510 Atherosclerotic heart disease of native coronary artery without angina pectoris: Secondary | ICD-10-CM

## 2013-06-20 DIAGNOSIS — D649 Anemia, unspecified: Secondary | ICD-10-CM | POA: Insufficient documentation

## 2013-06-20 DIAGNOSIS — I10 Essential (primary) hypertension: Secondary | ICD-10-CM

## 2013-06-20 DIAGNOSIS — E785 Hyperlipidemia, unspecified: Secondary | ICD-10-CM

## 2013-06-20 NOTE — Patient Instructions (Signed)
Your physician recommends that you continue on your current medications as directed. Please refer to the Current Medication list given to you today.  Your physician wants you to follow-up in: 1 year with Dr. Varanasi. You will receive a reminder letter in the mail two months in advance. If you don't receive a letter, please call our office to schedule the follow-up appointment.  

## 2013-06-20 NOTE — Progress Notes (Signed)
Patient ID: Melissa Elliott, female   DOB: 1941/03/14, 72 y.o.   MRN: [id number]    [address] [place], [place]  [address] Phone: [phone] Fax:  [phone]  Date:  06/20/2013   ID:  Melissa Elliott, DOB [birth year], MRN [id number]  PCP:  Georgann Housekeeper, MD      History of Present Illness: Melissa Elliott is a 72 y.o. female with CAD. CABG in 1996. BPs at home are usually 130-140 / 60s.  She had several stomach ulcers which were healing by EGD.  She has had sciatica in the right leg.   CAD/ASCVD:  Melissa Elliott regularly, 3x/week x 30-40 minutes. No problems with that. She does light weights and situps. Denies : Chest pain.  Dizziness.  Dyspnea on exertion.  Leg edema.  Nitroglycerin.  Orthopnea.  Palpitations.  Paroxysmal nocturnal dyspnea.  Syncope.     Wt Readings from Last 3 Encounters:  06/20/13 128 lb (58.06 kg)  02/01/13 123 lb (55.792 kg)  02/01/13 123 lb (55.792 kg)     Past Medical History  Diagnosis Date  . Coronary artery disease   . Complication of anesthesia     "takes a long time to wakeup" (02/01/2013)  . High cholesterol     "on RX because of the bypass" (02/01/2013)  . Hypertension   . Heart murmur   . Asthma   . Pneumonia ? 1970's    "once" (02/01/2013)  . Hypoglycemia     "that's something I have to watch constantly" (02/01/2013)  . H/O hiatal hernia   . Sciatic nerve pain 12/2012    "RLE" (02/01/2013)    Current Outpatient Prescriptions  Medication Sig Dispense Refill  . albuterol (PROVENTIL HFA;VENTOLIN HFA) 108 (90 BASE) MCG/ACT inhaler Inhale 2 puffs into the lungs every 6 (six) hours as needed for wheezing.      Marland Kitchen atenolol (TENORMIN) 50 MG tablet Take 50 mg by mouth daily.      Marland Kitchen losartan (COZAAR) 50 MG tablet Take 50 mg by mouth daily.      . nitroGLYCERIN (NITROSTAT) 0.4 MG SL tablet Place 0.4 mg under the tongue every 5 (five) minutes as needed for chest pain.      . pantoprazole (PROTONIX) 40 MG tablet Take 1 tablet (40 mg  total) by mouth 2 (two) times daily.  60 tablet  6  . simvastatin (ZOCOR) 40 MG tablet Take 40 mg by mouth every evening.       No current facility-administered medications for this visit.    Allergies:    Allergies  Allergen Reactions  . Ciprofloxacin Other (See Comments)    "dry heaves"  . Iodine Hives  . Sulfa Antibiotics   . Eggs Or Egg-Derived Products Nausea And Vomiting    "sometimes I can eat eggs; sometimes I can't"  . Penicillins Rash    unknown    Social History:  The patient  reports that she has never smoked. She has never used smokeless tobacco. She reports that she drinks alcohol. She reports that she does not use illicit drugs.   Family History:  The patient's family history includes CAD in her mother; Heart attack in her father.   ROS:  Please see the history of present illness.  No nausea, vomiting.  No fevers, chills.  No focal weakness.  No dysuria.   All other systems reviewed and negative.   PHYSICAL EXAM: VS:  BP 138/80  Pulse 82  Ht 5\' 4"  (1.626 m)  Wt 128 lb (  58.06 kg)  BMI 21.96 kg/m2  SpO2 96% Well nourished, well developed, in no acute distress HEENT: normal Neck: no JVD, no carotid bruits Cardiac:  normal S1, S2; RRR;  Lungs:  clear to auscultation bilaterally, no wheezing, rhonchi or rales Abd: soft, nontender, no hepatomegaly Ext: no edema Skin: warm and dry Neuro:   no focal abnormalities noted  EKG:  NSR, IRBBB, no ST segment changes  ASSESSMENT AND PLAN:  Coronary atherosclerosis of unspecified type of vessel, native or graft  Continue Aspirin 81 mg tablet one tab, orally, daily Refill Nitroglycerin Tablet Sublingual, 0.4 MG, 1 tablet under the tongue, Sublingual, As needed for chest pain, 30 days, 11, Refills 3 Diagnostic Imaging:EKG Harward,Amy 06/18/2012 11:44:40 AM > [place] Quinter,JAY 06/18/2012 12:04:18 PM > NSR, rSR', no ST segment changes   No change on ECG in 7/14. No angina. Continue aggressive secondary prevention.  Did have  some anemia to 9.8.     2. Benign hypertension  Continue Losartan Potassium Tablet, 50 MG, 1 tablet, Orally, Once a day, 30 day(s), 30, Refills 11 Changed to generic losartan for cost savings. Check readings at home.  At home, she gets 128-133 systolic.     3. Hypercholesterolemia, Mixed  Continue Simvastatin Tablet, 40 MG, 1 tablet every evening, Orally, Once a day Well controlled in the past on Vytorin. If she has an increased LDL on Simvastatin, would try atorvastatin 40 mg daily. LDL 74, HDL 44.   4. Anemia: related to ulcers.  Just restarted aspirin 81 mg daily. Preventive Medicine  Adult topics discussed:  Exercise: 5 days a week, at least 30 minutes of aerobic exercise.      Signed, Fredric Mare, MD, [facility] 06/20/2013 9:30 AM

## 2014-02-21 ENCOUNTER — Encounter (HOSPITAL_COMMUNITY): Payer: Self-pay | Admitting: Emergency Medicine

## 2014-02-21 ENCOUNTER — Emergency Department (HOSPITAL_COMMUNITY): Payer: No Typology Code available for payment source

## 2014-02-21 ENCOUNTER — Emergency Department (HOSPITAL_COMMUNITY)
Admission: EM | Admit: 2014-02-21 | Discharge: 2014-02-22 | Disposition: A | Discharge status: Home / Self Care | Payer: No Typology Code available for payment source | Attending: Emergency Medicine | Admitting: Emergency Medicine

## 2014-02-21 DIAGNOSIS — Z951 Presence of aortocoronary bypass graft: Secondary | ICD-10-CM | POA: Insufficient documentation

## 2014-02-21 DIAGNOSIS — J45909 Unspecified asthma, uncomplicated: Secondary | ICD-10-CM | POA: Diagnosis not present

## 2014-02-21 DIAGNOSIS — Z8719 Personal history of other diseases of the digestive system: Secondary | ICD-10-CM | POA: Diagnosis not present

## 2014-02-21 DIAGNOSIS — S335XXA Sprain of ligaments of lumbar spine, initial encounter: Secondary | ICD-10-CM | POA: Diagnosis not present

## 2014-02-21 DIAGNOSIS — S139XXA Sprain of joints and ligaments of unspecified parts of neck, initial encounter: Secondary | ICD-10-CM | POA: Insufficient documentation

## 2014-02-21 DIAGNOSIS — R011 Cardiac murmur, unspecified: Secondary | ICD-10-CM | POA: Diagnosis not present

## 2014-02-21 DIAGNOSIS — E7800 Pure hypercholesterolemia, unspecified: Secondary | ICD-10-CM | POA: Diagnosis not present

## 2014-02-21 DIAGNOSIS — Z880 Allergy status to penicillin: Secondary | ICD-10-CM | POA: Diagnosis not present

## 2014-02-21 DIAGNOSIS — Z8739 Personal history of other diseases of the musculoskeletal system and connective tissue: Secondary | ICD-10-CM | POA: Diagnosis not present

## 2014-02-21 DIAGNOSIS — S161XXA Strain of muscle, fascia and tendon at neck level, initial encounter: Secondary | ICD-10-CM

## 2014-02-21 DIAGNOSIS — Y9389 Activity, other specified: Secondary | ICD-10-CM | POA: Insufficient documentation

## 2014-02-21 DIAGNOSIS — Z79899 Other long term (current) drug therapy: Secondary | ICD-10-CM | POA: Diagnosis not present

## 2014-02-21 DIAGNOSIS — Y92410 Unspecified street and highway as the place of occurrence of the external cause: Secondary | ICD-10-CM | POA: Insufficient documentation

## 2014-02-21 DIAGNOSIS — Z8701 Personal history of pneumonia (recurrent): Secondary | ICD-10-CM | POA: Diagnosis not present

## 2014-02-21 DIAGNOSIS — I2510 Atherosclerotic heart disease of native coronary artery without angina pectoris: Secondary | ICD-10-CM | POA: Diagnosis not present

## 2014-02-21 DIAGNOSIS — S39012A Strain of muscle, fascia and tendon of lower back, initial encounter: Secondary | ICD-10-CM

## 2014-02-21 DIAGNOSIS — IMO0002 Reserved for concepts with insufficient information to code with codable children: Secondary | ICD-10-CM | POA: Diagnosis present
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
## 2014-02-22 ENCOUNTER — Encounter (HOSPITAL_COMMUNITY): Payer: Self-pay | Admitting: Radiology

## 2014-02-22 ENCOUNTER — Emergency Department (HOSPITAL_COMMUNITY): Payer: No Typology Code available for payment source

## 2014-02-22 NOTE — ED Notes (Signed)
See Paper Charting

## 2014-02-22 NOTE — Discharge Instructions (Signed)
Please call your doctor for a followup appointment within 24-48 hours. When you talk to your doctor please let them know that you were seen in the emergency department and have them acquire all of your records so that they can discuss the findings with you and formulate a treatment plan to fully care for your new and ongoing problems. Please call and set-up an appointment with your primary care provider and orthopedics Please rest, heat, and massage Please continue to monitor symptoms closely and if symptoms worsen or change report back to the ED immediately
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
## 2014-03-10 ENCOUNTER — Other Ambulatory Visit: Payer: Self-pay

## 2014-03-10 DIAGNOSIS — Z1231 Encounter for screening mammogram for malignant neoplasm of breast: Secondary | ICD-10-CM

## 2014-03-15 ENCOUNTER — Ambulatory Visit
Admission: RE | Admit: 2014-03-15 | Discharge: 2014-03-15 | Disposition: A | Discharge status: Home / Self Care | Payer: Commercial Managed Care - HMO | Source: Ambulatory Visit

## 2014-03-15 DIAGNOSIS — Z1231 Encounter for screening mammogram for malignant neoplasm of breast: Secondary | ICD-10-CM

## 2014-04-28 ENCOUNTER — Other Ambulatory Visit: Payer: Self-pay

## 2014-06-26 ENCOUNTER — Encounter: Payer: Self-pay | Admitting: Interventional Cardiology

## 2014-06-26 ENCOUNTER — Ambulatory Visit (INDEPENDENT_AMBULATORY_CARE_PROVIDER_SITE_OTHER): Payer: Commercial Managed Care - HMO | Admitting: Interventional Cardiology

## 2014-06-26 VITALS — BP 164/78 | HR 63 | Ht 64.0 in | Wt 129.4 lb

## 2014-06-26 DIAGNOSIS — E785 Hyperlipidemia, unspecified: Secondary | ICD-10-CM

## 2014-06-26 DIAGNOSIS — Z951 Presence of aortocoronary bypass graft: Secondary | ICD-10-CM

## 2014-06-26 DIAGNOSIS — I10 Essential (primary) hypertension: Secondary | ICD-10-CM

## 2014-06-26 DIAGNOSIS — I2510 Atherosclerotic heart disease of native coronary artery without angina pectoris: Secondary | ICD-10-CM

## 2014-06-26 NOTE — Patient Instructions (Signed)
Your physician recommends that you continue on your current medications as directed. Please refer to the Current Medication list given to you today.  Your physician wants you to follow-up in: 1 year with Dr. Varanasi. You will receive a reminder letter in the mail two months in advance. If you don't receive a letter, please call our office to schedule the follow-up appointment.  

## 2014-06-26 NOTE — Progress Notes (Signed)
Patient ID: Melissa Elliott, female   DOB: 02-Dec-1940, 73 y.o.   MRN: [id number] Patient ID: Melissa Elliott, female   DOB: 12-31-1940, 73 y.o.   MRN: [id number]    [address] [place], [place]  [address] Phone: [phone] Fax:  [phone]  Date:  06/26/2014   ID:  Melissa Elliott, DOB Feb 25, 1941, MRN [id number]  PCP:  Wenda Low, MD      History of Present Illness: Melissa Elliott is a 73 y.o. female with CAD. CABG in 1996. BPs at home are usually [phone] / 60s.  She had several stomach ulcers which were healing by EGD; likely secondary to meloxicam.  Not taking NSAIDs now. CAD/ASCVD:  Linus Mako regularly, 3x/week x 30-40 minutes. No problems with that. She does light weights and situps. Denies : Chest pain.  Dizziness.  Dyspnea on exertion.  Leg edema.  Nitroglycerin.  Orthopnea.  Palpitations.  Paroxysmal nocturnal dyspnea.  Syncope.     Wt Readings from Last 3 Encounters:  06/26/14 129 lb 6.4 oz (58.695 kg)  02/21/14 128 lb (58.06 kg)  06/20/13 128 lb (58.06 kg)     Past Medical History  Diagnosis Date  . Coronary artery disease   . Complication of anesthesia     "takes a long time to wakeup" (02/01/2013)  . High cholesterol     "on RX because of the bypass" (02/01/2013)  . Hypertension   . Heart murmur   . Asthma   . Pneumonia ? 1970's    "once" (02/01/2013)  . Hypoglycemia     "that's something I have to watch constantly" (02/01/2013)  . H/O hiatal hernia   . Sciatic nerve pain 12/2012    "RLE" (02/01/2013)    Current Outpatient Prescriptions  Medication Sig Dispense Refill  . albuterol (PROVENTIL HFA;VENTOLIN HFA) 108 (90 BASE) MCG/ACT inhaler Inhale 2 puffs into the lungs every 6 (six) hours as needed for wheezing.    Marland Kitchen losartan (COZAAR) 50 MG tablet Take 50 mg by mouth daily.    . metoprolol succinate (TOPROL-XL) 50 MG 24 hr tablet Take 50 mg by mouth daily.    . nitroGLYCERIN (NITROSTAT) 0.4 MG SL tablet Place 0.4 mg under the tongue  every 5 (five) minutes as needed for chest pain.    . pantoprazole (PROTONIX) 40 MG tablet Take 1 tablet (40 mg total) by mouth 2 (two) times daily. 60 tablet 6  . simvastatin (ZOCOR) 40 MG tablet Take 40 mg by mouth every evening.     No current facility-administered medications for this visit.    Allergies:    Allergies  Allergen Reactions  . Ciprofloxacin Other (See Comments)    "dry heaves"  . Iodine Hives  . Sulfa Antibiotics   . Eggs Or Egg-Derived Products Nausea And Vomiting    "sometimes I can eat eggs; sometimes I can't"  . Penicillins Rash    unknown    Social History:  The patient  reports that she has never smoked. She has never used smokeless tobacco. She reports that she drinks alcohol. She reports that she does not use illicit drugs.   Family History:  The patient's family history includes CAD in her mother; Heart attack in her father.   ROS:  Please see the history of present illness.  No nausea, vomiting.  No fevers, chills.  No focal weakness.  No dysuria.   All other systems reviewed and negative.   PHYSICAL EXAM: VS:  BP 164/78 mmHg  Pulse 63  Ht 5'  4" (1.626 m)  Wt 129 lb 6.4 oz (58.695 kg)  BMI 22.20 kg/m2 Well nourished, well developed, in no acute distress HEENT: normal Neck: no JVD, no carotid bruits Cardiac:  normal S1, S2; RRR;  Lungs:  clear to auscultation bilaterally, no wheezing, rhonchi or rales Abd: soft, nontender, no hepatomegaly Ext: no edema Skin: warm and dry Neuro:   no focal abnormalities noted Psych: normal affect  EKG:  NSR, IRBBB, no ST segment changes  ASSESSMENT AND PLAN:  Coronary atherosclerosis of unspecified type of vessel, native or graft  Continue Aspirin 81 mg tablet one tab, orally, daily Refill Nitroglycerin Tablet Sublingual, 0.4 MG, 1 tablet under the tongue, Sublingual, As needed for chest pain, 30 days, 11, Refills 3  No angina. Continue aggressive secondary prevention.  Status post bypass surgery many years  ago.    2. Benign hypertension  Continue Losartan Potassium Tablet, 50 MG, 1 tablet, Orally, Once a day, 30 day(s), 30, Refills 11 Changed to generic losartan for cost savings. Check readings at home.  At home, she gets [phone] systolic.  She has had a stressful day today which may contribute to the increased systolic blood pressure.    3. Hypercholesterolemia, Mixed  Continue Simvastatin Tablet, 40 MG, 1 tablet every evening, Orally, Once a day Well controlled in the past on Vytorin. If she develops an increased LDL on Simvastatin, would try atorvastatin 40 mg daily. LDL 74, HDL 44 In 2014.  Lipids were checked 1 week ago. Results not available.    Preventive Medicine  Adult topics discussed:  Exercise: 5 days a week, at least 30 minutes of aerobic exercise.      Signed, Mina Marble, MD, [facility] 06/26/2014 3:57 PM

## 2014-10-04 DIAGNOSIS — Z872 Personal history of diseases of the skin and subcutaneous tissue: Secondary | ICD-10-CM | POA: Diagnosis not present

## 2014-10-04 DIAGNOSIS — D2271 Melanocytic nevi of right lower limb, including hip: Secondary | ICD-10-CM | POA: Diagnosis not present

## 2014-10-04 DIAGNOSIS — B351 Tinea unguium: Secondary | ICD-10-CM | POA: Diagnosis not present

## 2014-10-04 DIAGNOSIS — Z1283 Encounter for screening for malignant neoplasm of skin: Secondary | ICD-10-CM | POA: Diagnosis not present

## 2014-10-04 DIAGNOSIS — D225 Melanocytic nevi of trunk: Secondary | ICD-10-CM | POA: Diagnosis not present

## 2015-01-08 ENCOUNTER — Other Ambulatory Visit: Payer: Self-pay

## 2015-03-15 DIAGNOSIS — I10 Essential (primary) hypertension: Secondary | ICD-10-CM | POA: Diagnosis not present

## 2015-03-15 DIAGNOSIS — K269 Duodenal ulcer, unspecified as acute or chronic, without hemorrhage or perforation: Secondary | ICD-10-CM | POA: Diagnosis not present

## 2015-03-15 DIAGNOSIS — J4530 Mild persistent asthma, uncomplicated: Secondary | ICD-10-CM | POA: Diagnosis not present

## 2015-03-15 DIAGNOSIS — Z0000 Encounter for general adult medical examination without abnormal findings: Secondary | ICD-10-CM | POA: Diagnosis not present

## 2015-03-15 DIAGNOSIS — E782 Mixed hyperlipidemia: Secondary | ICD-10-CM | POA: Diagnosis not present

## 2015-03-15 DIAGNOSIS — M5090 Cervical disc disorder, unspecified, unspecified cervical region: Secondary | ICD-10-CM | POA: Diagnosis not present

## 2015-03-15 DIAGNOSIS — R7309 Other abnormal glucose: Secondary | ICD-10-CM | POA: Diagnosis not present

## 2015-03-15 DIAGNOSIS — I25810 Atherosclerosis of coronary artery bypass graft(s) without angina pectoris: Secondary | ICD-10-CM | POA: Diagnosis not present

## 2015-03-21 DIAGNOSIS — H2513 Age-related nuclear cataract, bilateral: Secondary | ICD-10-CM | POA: Diagnosis not present

## 2015-03-22 DIAGNOSIS — Z0100 Encounter for examination of eyes and vision without abnormal findings: Secondary | ICD-10-CM | POA: Diagnosis not present

## 2015-04-06 ENCOUNTER — Encounter: Payer: Self-pay | Admitting: Interventional Cardiology

## 2015-05-11 ENCOUNTER — Other Ambulatory Visit: Payer: Self-pay

## 2015-05-11 DIAGNOSIS — Z1231 Encounter for screening mammogram for malignant neoplasm of breast: Secondary | ICD-10-CM

## 2015-05-15 DIAGNOSIS — I10 Essential (primary) hypertension: Secondary | ICD-10-CM | POA: Diagnosis not present

## 2015-05-15 DIAGNOSIS — M859 Disorder of bone density and structure, unspecified: Secondary | ICD-10-CM | POA: Diagnosis not present

## 2015-05-15 DIAGNOSIS — M8589 Other specified disorders of bone density and structure, multiple sites: Secondary | ICD-10-CM | POA: Diagnosis not present

## 2015-05-22 ENCOUNTER — Ambulatory Visit
Admission: RE | Admit: 2015-05-22 | Discharge: 2015-05-22 | Disposition: A | Discharge status: Home / Self Care | Payer: Commercial Managed Care - HMO | Source: Ambulatory Visit

## 2015-05-22 DIAGNOSIS — Z1231 Encounter for screening mammogram for malignant neoplasm of breast: Secondary | ICD-10-CM | POA: Diagnosis not present

## 2015-06-13 ENCOUNTER — Other Ambulatory Visit: Payer: Self-pay | Admitting: Gastroenterology

## 2015-06-14 NOTE — Anesthesia Preprocedure Evaluation (Addendum)
Anesthesia Evaluation  Patient identified by MRN, date of birth, ID band  Reviewed: Allergy & Precautions, NPO status , Patient's Chart, lab work & pertinent test results  History of Anesthesia Complications (+) PROLONGED EMERGENCE  Airway Mallampati: II   Neck ROM: Full    Dental  (+) Dental Advisory Given   Pulmonary asthma ,    breath sounds clear to auscultation       Cardiovascular hypertension, Pt. on medications + CAD   Rhythm:Regular  SP ACB 1997, EKG 2015 partial RBB   Neuro/Psych    GI/Hepatic hiatal hernia,   Endo/Other    Renal/GU      Musculoskeletal   Abdominal (+)  Abdomen: soft.    Peds  Hematology   Anesthesia Other Findings   Reproductive/Obstetrics                            Anesthesia Physical Anesthesia Plan  ASA: III  Anesthesia Plan: MAC   Post-op Pain Management:    Induction: Intravenous  Airway Management Planned: Nasal Cannula  Additional Equipment:   Intra-op Plan:   Post-operative Plan:   Informed Consent: I have reviewed the patients History and Physical, chart, labs and discussed the procedure including the risks, benefits and alternatives for the proposed anesthesia with the patient or authorized representative who has indicated his/her understanding and acceptance.     Plan Discussed with:   Anesthesia Plan Comments: (Nausea and vomiting to EGG products, ask about propofol, should be ok to use)        Anesthesia Quick Evaluation

## 2015-06-18 ENCOUNTER — Encounter (HOSPITAL_COMMUNITY): Payer: Self-pay | Admitting: *Deleted

## 2015-06-19 ENCOUNTER — Ambulatory Visit (HOSPITAL_COMMUNITY)
Admission: RE | Admit: 2015-06-19 | Discharge: 2015-06-19 | Disposition: A | Discharge status: Home / Self Care | Payer: Commercial Managed Care - HMO | Source: Ambulatory Visit | Attending: Gastroenterology | Admitting: Gastroenterology

## 2015-06-19 ENCOUNTER — Encounter (HOSPITAL_COMMUNITY): Payer: Self-pay

## 2015-06-19 ENCOUNTER — Encounter (HOSPITAL_COMMUNITY)
Admission: RE | Disposition: A | Discharge status: Home / Self Care | Payer: Self-pay | Source: Ambulatory Visit | Attending: Gastroenterology

## 2015-06-19 ENCOUNTER — Ambulatory Visit (HOSPITAL_COMMUNITY): Payer: Commercial Managed Care - HMO | Admitting: Anesthesiology

## 2015-06-19 DIAGNOSIS — K449 Diaphragmatic hernia without obstruction or gangrene: Secondary | ICD-10-CM | POA: Insufficient documentation

## 2015-06-19 DIAGNOSIS — J45909 Unspecified asthma, uncomplicated: Secondary | ICD-10-CM | POA: Diagnosis not present

## 2015-06-19 DIAGNOSIS — Z1211 Encounter for screening for malignant neoplasm of colon: Secondary | ICD-10-CM | POA: Insufficient documentation

## 2015-06-19 DIAGNOSIS — Z951 Presence of aortocoronary bypass graft: Secondary | ICD-10-CM | POA: Insufficient documentation

## 2015-06-19 DIAGNOSIS — K635 Polyp of colon: Secondary | ICD-10-CM | POA: Diagnosis not present

## 2015-06-19 DIAGNOSIS — E7800 Pure hypercholesterolemia, unspecified: Secondary | ICD-10-CM | POA: Insufficient documentation

## 2015-06-19 DIAGNOSIS — Z86010 Personal history of colonic polyps: Secondary | ICD-10-CM | POA: Diagnosis not present

## 2015-06-19 DIAGNOSIS — I2510 Atherosclerotic heart disease of native coronary artery without angina pectoris: Secondary | ICD-10-CM | POA: Insufficient documentation

## 2015-06-19 DIAGNOSIS — I10 Essential (primary) hypertension: Secondary | ICD-10-CM | POA: Insufficient documentation

## 2015-06-19 HISTORY — PX: COLONOSCOPY WITH PROPOFOL: SHX5780

## 2015-06-19 SURGERY — COLONOSCOPY WITH PROPOFOL
Anesthesia: Monitor Anesthesia Care

## 2015-06-19 MED ORDER — LACTATED RINGERS IV SOLN
INTRAVENOUS | Status: DC
  Administered 2015-06-19: 10:00:00 via INTRAVENOUS
  Administered 2015-06-19: 1000 mL via INTRAVENOUS

## 2015-06-19 MED ORDER — PROPOFOL 500 MG/50ML IV EMUL
INTRAVENOUS | Status: DC | PRN
  Administered 2015-06-19: 250 ug/kg/min via INTRAVENOUS

## 2015-06-19 MED ORDER — MEPERIDINE HCL 100 MG/ML IJ SOLN: 6.2500 mg | INTRAMUSCULAR | Status: DC | PRN

## 2015-06-19 MED ORDER — PROPOFOL 10 MG/ML IV BOLUS
INTRAVENOUS | Status: AC
  Filled 2015-06-19: qty 40

## 2015-06-19 MED ORDER — PROMETHAZINE HCL 25 MG/ML IJ SOLN: 6.2500 mg | INTRAMUSCULAR | Status: DC | PRN

## 2015-06-19 MED ORDER — SODIUM CHLORIDE 0.9 % IV SOLN: INTRAVENOUS | Status: DC

## 2015-06-19 MED ORDER — FENTANYL CITRATE (PF) 100 MCG/2ML IJ SOLN: 25.0000 ug | INTRAMUSCULAR | Status: DC | PRN

## 2015-06-19 SURGICAL SUPPLY — 22 items

## 2015-06-19 NOTE — Anesthesia Postprocedure Evaluation (Signed)
Anesthesia Post Note  Patient: Melissa Elliott  Procedure(s) Performed: Procedure(s) (LRB): COLONOSCOPY WITH PROPOFOL (N/A)  Patient location during evaluation: PACU Anesthesia Type: MAC Level of consciousness: awake and alert Pain management: pain level controlled Vital Signs Assessment: post-procedure vital signs reviewed and stable Respiratory status: spontaneous breathing, nonlabored ventilation, respiratory function stable and patient connected to nasal cannula oxygen Cardiovascular status: stable and blood pressure returned to baseline Anesthetic complications: no    Last Vitals:  Filed Vitals:   06/19/15 1007 06/19/15 1040  BP: 186/77 150/75  Pulse: 83   Temp: 36.6 C   Resp: 14     Last Pain: There were no vitals filed for this visit.               Taron Mondor

## 2015-06-19 NOTE — Transfer of Care (Signed)
Immediate Anesthesia Transfer of Care Note  Patient: Melissa Elliott  Procedure(s) Performed: Procedure(s): COLONOSCOPY WITH PROPOFOL (N/A)  Patient Location: PACU  Anesthesia Type:MAC  Level of Consciousness: sedated, patient cooperative and responds to stimulation  Airway & Oxygen Therapy: Patient Spontanous Breathing and Patient connected to face mask oxygen  Post-op Assessment: Report given to RN and Post -op Vital signs reviewed and stable  Post vital signs: Reviewed and stable  Last Vitals:  Filed Vitals:   06/19/15 0854  BP: 158/80  Pulse: 93  Temp: 36.7 C  Resp: 12    Complications: No apparent anesthesia complications

## 2015-06-19 NOTE — Op Note (Signed)
Procedure: Screening colonoscopy. Normal screening colonoscopy performed on 01/29/2009  Endoscopist: Earle Gell  Premedication: Propofol administered by anesthesia  Procedure: The patient was placed in the left lateral decubitus position. Anal inspection and digital rectal exam were normal. The Pentax pediatric colonoscope was introduced into the rectum and advanced to the cecum. A normal-appearing appendiceal orifice and ileocecal valve were identified. Colonic preparation for the exam today was good. Withdrawal time was 16 minutes  Rectum. Normal. Retroflexed view of the distal rectum normal  Sigmoid colon and descending colon. Normal  Splenic flexure. Normal  Transverse colon. Normal  Hepatic flexure. Normal  Ascending colon. Normal  Cecum and ileocecal valve. Normal  Assessment: Normal screening colonoscopy.

## 2015-06-19 NOTE — H&P (Signed)
  Procedure: Surveillance colonoscopy. Normal surveillance colonoscopy performed on 01/29/2009.? 2005 colonoscopy performed with removal of colon polyps.  History: The patient is a 74 year old female born 1940-09-03. She is scheduled to undergo a surveillance colonoscopy today.  Past medical history: Coronary artery disease. Coronary artery bypass grafting surgery performed in 1996. Hypertension. Hypercholesterolemia. Cervical disc disease. Allergic rhinitis. Asthma. Vitamin D deficiency. H. pylori negative duodenal ulcer. Appendectomy. Tonsillectomy. Exploratory laparoscopy.  Medication allergies: Iodine causes hives. Bactrim causes swelling of the face arms and neck. Penicillin causes rash. Codeine causes rash.  Exam: The patient is alert and lying comfortably on the endoscopy stretcher. Abdomen is soft and nontender to palpation. Lungs are clear to auscultation. Cardiac exam reveals a regular rhythm.  Plan: Proceed with surveillance colonoscopy

## 2015-06-19 NOTE — Discharge Instructions (Signed)

## 2015-06-20 ENCOUNTER — Encounter (HOSPITAL_COMMUNITY): Payer: Self-pay | Admitting: Gastroenterology

## 2015-08-02 ENCOUNTER — Encounter: Payer: Self-pay | Admitting: Interventional Cardiology

## 2015-08-02 ENCOUNTER — Ambulatory Visit (INDEPENDENT_AMBULATORY_CARE_PROVIDER_SITE_OTHER): Payer: Commercial Managed Care - HMO | Admitting: Interventional Cardiology

## 2015-08-02 VITALS — BP 148/78 | HR 80 | Ht 63.0 in | Wt 133.0 lb

## 2015-08-02 DIAGNOSIS — I2510 Atherosclerotic heart disease of native coronary artery without angina pectoris: Secondary | ICD-10-CM

## 2015-08-02 DIAGNOSIS — Z951 Presence of aortocoronary bypass graft: Secondary | ICD-10-CM

## 2015-08-02 DIAGNOSIS — E785 Hyperlipidemia, unspecified: Secondary | ICD-10-CM

## 2015-08-02 DIAGNOSIS — R0789 Other chest pain: Secondary | ICD-10-CM | POA: Diagnosis not present

## 2015-08-02 DIAGNOSIS — I10 Essential (primary) hypertension: Secondary | ICD-10-CM | POA: Diagnosis not present

## 2015-08-02 NOTE — Progress Notes (Signed)
Patient ID: Melissa Elliott, female   DOB: February 10, 1941, 75 y.o.   MRN: [id number]     Cardiology Office Note   Date:  08/02/2015   ID:  Melissa Elliott, DOB 1940/08/24, MRN [id number]  PCP:  Melissa Low, MD    No chief complaint on file. f/u CAD   Wt Readings from Last 3 Encounters:  08/02/15 133 lb (60.328 kg)  06/19/15 133 lb (60.328 kg)  06/26/14 129 lb 6.4 oz (58.695 kg)       History of Present Illness: Melissa Elliott is a 75 y.o. female  with CAD. CABG in 1996. BPs at home are usually [phone] / 60s. She had several stomach ulcers which were healing by EGD several years ago; likely secondary to meloxicam. Not taking NSAIDs now.  She has had a difficult year in some respects. She has been unable to exercise because she was busy caring for her mother-in-law passed away. Her husband was ill with appendicitis and required surgery.  She is trying to get back to exercise. When she walks, she notices some fatigue in her legs. It does not make her stop but it is different than when she was walking regularly before. She also feels a mild tightness in her throat. This is reminiscent of the symptoms she had prior to her bypass but not as severe. She can continue to walk in the pain will resolve, but this is the same pattern that she had even before her bypass.    Past Medical History  Diagnosis Date  . Coronary artery disease   . Complication of anesthesia     "takes a long time to wakeup" (02/01/2013)  . High cholesterol     "on RX because of the bypass" (02/01/2013)  . Hypertension   . Heart murmur   . Asthma   . Pneumonia ? 1970's    "once" (02/01/2013)  . Hypoglycemia     "that's something I have to watch constantly" (02/01/2013)  . H/O hiatal hernia   . Sciatic nerve pain 12/2012    "RLE" (02/01/2013)    Past Surgical History  Procedure Laterality Date  . Coronary artery bypass graft  ~ 1997    CABG X2  . Tonsillectomy  1940's  . Appendectomy  1970  .  Breast lumpectomy Left ~ 1975  . Skin lesion excision Left 1960's    "upper arm; sore that never healed" (02/01/2013)  . Esophagogastroduodenoscopy N/A 02/02/2013    Procedure: ESOPHAGOGASTRODUODENOSCOPY (EGD);  Surgeon: Melissa Horner, MD;  Location: [facility] ENDOSCOPY;  Service: Endoscopy;  Laterality: N/A;  . Colonoscopy with propofol N/A 06/19/2015    Procedure: COLONOSCOPY WITH PROPOFOL;  Surgeon: Melissa Fair, MD;  Location: [facility] ENDOSCOPY;  Service: Endoscopy;  Laterality: N/A;     Current Outpatient Prescriptions  Medication Sig Dispense Refill  . albuterol (PROVENTIL HFA;VENTOLIN HFA) 108 (90 BASE) MCG/ACT inhaler Inhale 2 puffs into the lungs every 6 (six) hours as needed for wheezing.    Marland Kitchen aspirin (ASPIRIN EC) 81 MG EC tablet Take 81 mg by mouth daily. Swallow whole.    . calcium carbonate (OS-CAL) 600 MG tablet Take 600 mg by mouth daily.    . Cholecalciferol (VITAMIN D) 2000 UNITS CAPS Take 2,000 Units by mouth daily.    . metoprolol succinate (TOPROL-XL) 50 MG 24 hr tablet Take 50 mg by mouth at bedtime.     . nitroGLYCERIN (NITROSTAT) 0.4 MG SL tablet Place 0.4 mg under the tongue every 5 (five) minutes as  needed for chest pain.    . simvastatin (ZOCOR) 40 MG tablet Take 40 mg by mouth at bedtime.     . valsartan (DIOVAN) 160 MG tablet Take 160 mg by mouth daily.     No current facility-administered medications for this visit.    Allergies:   Ciprofloxacin; Sulfa antibiotics; Iodine; Eggs or egg-derived products; and Penicillins    Social History:  The patient  reports that she has never smoked. She has never used smokeless tobacco. She reports that she drinks alcohol. She reports that she does not use illicit drugs.   Family History:  The patient's family history includes CAD in her mother; Heart attack in her father; Hypertension in her father, maternal grandfather, maternal grandmother, and mother. There is no history of Stroke.    ROS:  Please see the history of present  illness.   Otherwise, review of systems are positive for like fatigue, throat discomfort.   All other systems are reviewed and negative.    PHYSICAL EXAM: VS:  BP 148/78 mmHg  Pulse 80  Ht 5\' 3"  (1.6 m)  Wt 133 lb (60.328 kg)  BMI 23.57 kg/m2 , BMI Body mass index is 23.57 kg/(m^2). GEN: Well nourished, well developed, in no acute distress HEENT: normal Neck: no JVD, carotid bruits, or masses Cardiac: RRR; no murmurs, rubs, or gallops,no edema  Respiratory:  clear to auscultation bilaterally, normal work of breathing GI: soft, nontender, nondistended, + BS MS: no deformity or atrophy Skin: warm and dry, no rash Neuro:  Strength and sensation are intact Psych: euthymic mood, full affect   EKG:   The ekg ordered today demonstrates normal, no ST segment changes   Recent Labs: No results found for requested labs within last 365 days.   Lipid Panel No results found for: CHOL, TRIG, HDL, CHOLHDL, VLDL, LDLCALC, LDLDIRECT   Other studies Reviewed: Additional studies/ records that were reviewed today with results demonstrating: no recent stress test, two-vessel bypass surgery in the mid 90s.   ASSESSMENT AND PLAN:  1. CAD with angina:use nitroglycerin as needed. We discussed cardiac cath versus stress testing.  Given that her symptoms are not limiting, we'll plan for nuclear stress test.  She is aware that if her symptoms persist or the stress test is abnormal, we'll pursue cardiac cath.we'll also consider adding more in the way of medical therapy after her stress test, depending on the result. 2. Hypertension: Blood pressure meds have been adjusted by Melissa Elliott.  Well controlled in general but borderline today. We'll see her blood pressure is at the time of the stress test. 3. Hyperlipidemia: Followed by Melissa Elliott.  No change in BP meds.    Current medicines are reviewed at length with the patient today.  The patient concerns regarding her medicines were addressed.  The  following changes have been made:  No change  Labs/ tests ordered today include:  No orders of the defined types were placed in this encounter.    Recommend 150 minutes/week of aerobic exercise Elliott fat, Elliott carb, high fiber diet recommended  Disposition:   FU after the stress test   Teresita Madura., MD  08/02/2015 11:59 AM    [address], [place], [place]  [address] Phone: [phone]; Fax: [phone]

## 2015-08-02 NOTE — Patient Instructions (Signed)
**Note De-Identified Ellajane Stong Obfuscation** Medication Instructions:  Same-no changes  Labwork: None  Testing/Procedures: Your physician has requested that you have en exercise stress myoview. For further information please visit [email or web address]. Please follow instruction sheet, as given.   Follow-Up: Your physician recommends that you schedule a follow-up appointment in: after Stress test.        If you need a refill on your cardiac medications before your next appointment, please call your pharmacy.

## 2015-08-07 ENCOUNTER — Telehealth (HOSPITAL_COMMUNITY): Payer: Self-pay | Admitting: *Deleted

## 2015-08-07 NOTE — Telephone Encounter (Signed)
Patient given detailed instructions per Myocardial Perfusion Study Information Sheet for the test on 08/10/15 at 08/10/15. Patient notified to arrive 15 minutes early and that it is imperative to arrive on time for appointment to keep from having the test rescheduled.  If you need to cancel or reschedule your appointment, please call the office within 24 hours of your appointment. Failure to do so may result in a cancellation of your appointment, and a $50 no show fee. Patient verbalized understanding.Gaila Engebretsen, Ranae Palms

## 2015-08-08 DIAGNOSIS — J069 Acute upper respiratory infection, unspecified: Secondary | ICD-10-CM | POA: Diagnosis not present

## 2015-08-10 ENCOUNTER — Ambulatory Visit (HOSPITAL_COMMUNITY): Payer: Commercial Managed Care - HMO | Attending: Interventional Cardiology

## 2015-08-10 DIAGNOSIS — I2510 Atherosclerotic heart disease of native coronary artery without angina pectoris: Secondary | ICD-10-CM | POA: Diagnosis not present

## 2015-08-10 DIAGNOSIS — R0789 Other chest pain: Secondary | ICD-10-CM | POA: Diagnosis not present

## 2015-08-10 DIAGNOSIS — R9439 Abnormal result of other cardiovascular function study: Secondary | ICD-10-CM | POA: Diagnosis not present

## 2015-08-10 DIAGNOSIS — I10 Essential (primary) hypertension: Secondary | ICD-10-CM | POA: Insufficient documentation

## 2015-08-10 LAB — MYOCARDIAL PERFUSION IMAGING
CHL CUP MPHR: 147 {beats}/min
CHL CUP NUCLEAR SRS: 7
CHL CUP RESTING HR STRESS: 79 {beats}/min
CSEPEDS: 0 s
CSEPEW: 7 METS
CSEPPHR: 155 {beats}/min
Exercise duration (min): 5 min
LV sys vol: 24 mL
LVDIAVOL: 64 mL
NUC STRESS TID: 1.06
Percent HR: 105 %
RATE: 0.27
SDS: 4
SSS: 10

## 2015-08-10 MED ORDER — TECHNETIUM TC 99M SESTAMIBI GENERIC - CARDIOLITE
30.5000 | Freq: Once | INTRAVENOUS | Status: AC | PRN
  Administered 2015-08-10: 31 via INTRAVENOUS

## 2015-08-10 MED ORDER — TECHNETIUM TC 99M SESTAMIBI GENERIC - CARDIOLITE
10.4000 | Freq: Once | INTRAVENOUS | Status: AC | PRN
  Administered 2015-08-10: 10 via INTRAVENOUS

## 2015-08-16 ENCOUNTER — Telehealth: Payer: Self-pay | Admitting: Interventional Cardiology

## 2015-08-16 NOTE — Telephone Encounter (Signed)
New message      Pt has questions about her recent stress test results.

## 2015-08-16 NOTE — Telephone Encounter (Signed)
Start Imdur 15 mg daily.  Only test would be a cath which is reasonable since her CABG was so many years ago.  She can see if sx get better with the Imdur.  If not, would schedule cath.

## 2015-08-16 NOTE — Telephone Encounter (Signed)
**Note De-Identified Melissa Elliott Obfuscation** The pt had a myoview on 1/27 which showed possible small area of ischemia and Dr Irish Lack wanted to know how the pt was feeling. At that time, the pt stated that she was fine and was without any complaints.  She called back today stating that she plans on taking several trips this year and wants to be sure her heart is ok for her to travel. She reports that she does have fatigue with exertion and wants to know if she needs any testing done. She is scheduled to see Dr Irish Lack on 2/14 but since one of her trips is planned for the end of this month she wants to have them done before then.  She is advised that I am forwarding this note to Dr Irish Lack and that I will call her with his recommendations. She verbalized understanding.

## 2015-08-16 NOTE — Telephone Encounter (Signed)
**Note De-identified Melissa Elliott Obfuscation** LMTCB

## 2015-08-17 ENCOUNTER — Telehealth: Payer: Self-pay | Admitting: Interventional Cardiology

## 2015-08-17 MED ORDER — ISOSORBIDE MONONITRATE ER 30 MG PO TB24: 15.0000 mg | ORAL_TABLET | Freq: Every day | ORAL | Status: DC

## 2015-08-17 NOTE — Telephone Encounter (Signed)
The pt states that she would like to talk with her husband before agreeing to cath. Per her request I have sent Imdur RX to [facility] in [place] to fill. The pt states that she will call with decision.

## 2015-08-17 NOTE — Telephone Encounter (Signed)
I s/w the pt and answered all of her questions concerning a heart cath. She verbalized understanding and states she does want to try Imdur 15 mg daily to see if her sx improve before her 2/14 OV with Dr Irish Lack.

## 2015-08-17 NOTE — Telephone Encounter (Signed)
New message   Pt returning call   She needs information on heart cath

## 2015-08-21 ENCOUNTER — Ambulatory Visit
Admission: RE | Admit: 2015-08-21 | Discharge: 2015-08-21 | Disposition: A | Discharge status: Home / Self Care | Payer: Commercial Managed Care - HMO | Source: Ambulatory Visit | Attending: Internal Medicine | Admitting: Internal Medicine

## 2015-08-21 ENCOUNTER — Other Ambulatory Visit: Payer: Self-pay | Admitting: Internal Medicine

## 2015-08-21 DIAGNOSIS — J4530 Mild persistent asthma, uncomplicated: Secondary | ICD-10-CM | POA: Diagnosis not present

## 2015-08-21 DIAGNOSIS — J209 Acute bronchitis, unspecified: Secondary | ICD-10-CM

## 2015-08-21 DIAGNOSIS — J208 Acute bronchitis due to other specified organisms: Secondary | ICD-10-CM | POA: Diagnosis not present

## 2015-08-27 NOTE — Progress Notes (Signed)
Patient ID: Melissa Elliott, female   DOB: [birth year], 75 y.o.   MRN: [id number]      Cardiology Office Note   Date:  08/28/2015   ID:  Melissa Elliott, DOB 02/11/1941, MRN [id number]  PCP:  Wenda Low, MD    Chief Complaint  Patient presents with  . Follow-up    f/u from stress test  f/u CAD   Wt Readings from Last 3 Encounters:  08/28/15 128 lb 6.4 oz (58.242 kg)  08/02/15 133 lb (60.328 kg)  06/19/15 133 lb (60.328 kg)       History of Present Illness: Melissa Elliott is a 75 y.o. female  with CAD. CABG in 1996. BPs at home are usually 125-135/ 60s. She had several stomach ulcers which were healing by EGD several years ago; likely secondary to meloxicam. Not taking NSAIDs now.  She has had a difficult year in some respects. She has been unable to exercise because she was busy caring for her mother-in-law passed away. Her husband was ill with appendicitis and required surgery.  She felt a little  A little throat tightness at the time of her last visit. We performed a stress test. This showed a questionable inferior wall defect.  The mild tightness in her throat was reminiscent of the symptoms she had prior to her bypass but not as severe.  She had no symptoms while walking on the treadmill during the stress test. She is now being treated for an asthma exacerbation with steroids and antibiotics. She still has a persistent cough. She has not had any acceleration of the throat tightness symptoms. She is not back to exercising. From a cardiac standpoint, she feels okay. Her biggest complaint is cough and asthma exacerbation.    Past Medical History  Diagnosis Date  . Coronary artery disease   . Complication of anesthesia     "takes a long time to wakeup" (02/01/2013)  . High cholesterol     "on RX because of the bypass" (02/01/2013)  . Hypertension   . Heart murmur   . Asthma   . Pneumonia ? 1970's    "once" (02/01/2013)  . Hypoglycemia     "that's something I  have to watch constantly" (02/01/2013)  . H/O hiatal hernia   . Sciatic nerve pain 12/2012    "RLE" (02/01/2013)    Past Surgical History  Procedure Laterality Date  . Coronary artery bypass graft  ~ 1997    CABG X2  . Tonsillectomy  1940's  . Appendectomy  1970  . Breast lumpectomy Left ~ 1975  . Skin lesion excision Left 1960's    "upper arm; sore that never healed" (02/01/2013)  . Esophagogastroduodenoscopy N/A 02/02/2013    Procedure: ESOPHAGOGASTRODUODENOSCOPY (EGD);  Surgeon: Wonda Horner, MD;  Location[facility] ENDOSCOPY;  Service: Endoscopy;  Laterality: N/A;  . Colonoscopy with propofol N/A 06/19/2015    Procedure: COLONOSCOPY WITH PROPOFOL;  Surgeon: Garlan Fair, MD;  Location: [facility] ENDOSCOPY;  Service: Endoscopy;  Laterality: N/A;     Current Outpatient Prescriptions  Medication Sig Dispense Refill  . albuterol (PROVENTIL HFA;VENTOLIN HFA) 108 (90 BASE) MCG/ACT inhaler Inhale 2 puffs into the lungs every 6 (six) hours as needed for wheezing.    Marland Kitchen aspirin (ASPIRIN EC) 81 MG EC tablet Take 81 mg by mouth daily. Swallow whole.    . calcium carbonate (OS-CAL) 600 MG tablet Take 600 mg by mouth daily.    . Cholecalciferol (VITAMIN D) 2000 UNITS CAPS Take 2,000 Units  by mouth daily.    . metoprolol succinate (TOPROL-XL) 50 MG 24 hr tablet Take 50 mg by mouth at bedtime.     . nitroGLYCERIN (NITROSTAT) 0.4 MG SL tablet Place 0.4 mg under the tongue every 5 (five) minutes as needed for chest pain.    . simvastatin (ZOCOR) 40 MG tablet Take 40 mg by mouth at bedtime.     . valsartan (DIOVAN) 160 MG tablet Take 160 mg by mouth daily.     No current facility-administered medications for this visit.    Allergies:   Ciprofloxacin; Sulfa antibiotics; Iodine; Eggs or egg-derived products; and Penicillins    Social History:  The patient  reports that she has never smoked. She has never used smokeless tobacco. She reports that she drinks alcohol. She reports that she does not use illicit  drugs.   Family History:  The patient's family history includes CAD in her mother; Heart attack in her father; Hypertension in her father, maternal grandfather, maternal grandmother, and mother. There is no history of Stroke.    ROS:  Please see the history of present illness.   Otherwise, review of systems are positive for like fatigue, throat discomfort.   All other systems are reviewed and negative.    PHYSICAL EXAM: VS:  BP 150/80 mmHg  Pulse 88  Ht 5' 3.5" (1.613 m)  Wt 128 lb 6.4 oz (58.242 kg)  BMI 22.39 kg/m2  SpO2 94% , BMI Body mass index is 22.39 kg/(m^2). GEN: Well nourished, well developed, in no acute distress HEENT: normal Neck: no JVD, carotid bruits, or masses Cardiac: RRR; no murmurs, rubs, or gallops,no edema  Respiratory:   wheezing to auscultation bilaterally, normal work of breathing,  cough GI: soft, nontender, nondistended, + BS MS: no deformity or atrophy Skin: warm and dry, no rash Neuro:  Strength and sensation are intact Psych: euthymic mood, full affect     Recent Labs: No results found for requested labs within last 365 days.   Lipid Panel No results found for: CHOL, TRIG, HDL, CHOLHDL, VLDL, LDLCALC, LDLDIRECT   Other studies Reviewed: Additional studies/ records that were reviewed today with results demonstrating: no recent stress test, two-vessel bypass surgery in the mid 90s.   ASSESSMENT AND PLAN:  1. CAD with angina/ mildly abnormal stress test:use nitroglycerin as needed. We discussed cardiac cath  But would only consider doing this after her asthma symptoms have resolved. Her stress test was low risk. THis showed a possible small area of ischemia.  She feels worse on the acid Sorbide. Will stop isosorbide Starke how she feels after her asthma exacerbation is improved.   I personally reviewed the stress test images with the patient and her husband. 2. Hypertension: Blood pressure meds have been adjusted by Dr. Deforest Hoyles.  Well controlled in  general but borderline today. We'll see her blood pressure is after  She is off of prednisone. 3. Hyperlipidemia: Followed by Dr. Deforest Hoyles.  No change in BP meds.    Current medicines are reviewed at length with the patient today.  The patient concerns regarding her medicines were addressed.  The following changes have been made:  No change  Labs/ tests ordered today include:  No orders of the defined types were placed in this encounter.    Recommend 150 minutes/week of aerobic exercise Low fat, low carb, high fiber diet recommended  Disposition:   FU in 3 months   Teresita Madura., MD  08/28/2015 12:55 PM    [facility] [address], [place], [place]  [address] Phone: [phone]; Fax: [phone]

## 2015-08-28 ENCOUNTER — Ambulatory Visit (INDEPENDENT_AMBULATORY_CARE_PROVIDER_SITE_OTHER): Payer: Commercial Managed Care - HMO | Admitting: Interventional Cardiology

## 2015-08-28 ENCOUNTER — Encounter: Payer: Self-pay | Admitting: Interventional Cardiology

## 2015-08-28 VITALS — BP 150/80 | HR 88 | Ht 63.5 in | Wt 128.4 lb

## 2015-08-28 DIAGNOSIS — R931 Abnormal findings on diagnostic imaging of heart and coronary circulation: Secondary | ICD-10-CM

## 2015-08-28 DIAGNOSIS — I10 Essential (primary) hypertension: Secondary | ICD-10-CM

## 2015-08-28 DIAGNOSIS — I25118 Atherosclerotic heart disease of native coronary artery with other forms of angina pectoris: Secondary | ICD-10-CM

## 2015-08-28 DIAGNOSIS — Z951 Presence of aortocoronary bypass graft: Secondary | ICD-10-CM

## 2015-08-28 DIAGNOSIS — J4521 Mild intermittent asthma with (acute) exacerbation: Secondary | ICD-10-CM

## 2015-08-28 DIAGNOSIS — J45909 Unspecified asthma, uncomplicated: Secondary | ICD-10-CM | POA: Insufficient documentation

## 2015-08-28 NOTE — Patient Instructions (Addendum)
Medication Instructions:  Stop taking Imdur-All other medications remain the same  Labwork: None  Testing/Procedures: None  Follow-Up: Your physician recommends that you schedule a follow-up appointment in: 3 months      If you need a refill on your cardiac medications before your next appointment, please call your pharmacy.

## 2015-09-13 ENCOUNTER — Other Ambulatory Visit: Payer: Self-pay | Admitting: Internal Medicine

## 2015-09-13 DIAGNOSIS — J4530 Mild persistent asthma, uncomplicated: Secondary | ICD-10-CM | POA: Diagnosis not present

## 2015-09-13 DIAGNOSIS — I10 Essential (primary) hypertension: Secondary | ICD-10-CM | POA: Diagnosis not present

## 2015-09-13 DIAGNOSIS — R059 Cough, unspecified: Secondary | ICD-10-CM

## 2015-09-13 DIAGNOSIS — J449 Chronic obstructive pulmonary disease, unspecified: Secondary | ICD-10-CM

## 2015-09-13 DIAGNOSIS — E782 Mixed hyperlipidemia: Secondary | ICD-10-CM | POA: Diagnosis not present

## 2015-09-13 DIAGNOSIS — R05 Cough: Secondary | ICD-10-CM | POA: Diagnosis not present

## 2015-09-14 ENCOUNTER — Ambulatory Visit
Admission: RE | Admit: 2015-09-14 | Discharge: 2015-09-14 | Disposition: A | Discharge status: Home / Self Care | Payer: Commercial Managed Care - HMO | Source: Ambulatory Visit | Attending: Internal Medicine | Admitting: Internal Medicine

## 2015-09-14 DIAGNOSIS — R918 Other nonspecific abnormal finding of lung field: Secondary | ICD-10-CM | POA: Diagnosis not present

## 2015-09-14 DIAGNOSIS — R059 Cough, unspecified: Secondary | ICD-10-CM

## 2015-09-14 DIAGNOSIS — J449 Chronic obstructive pulmonary disease, unspecified: Secondary | ICD-10-CM

## 2015-09-14 DIAGNOSIS — R05 Cough: Secondary | ICD-10-CM

## 2015-10-03 DIAGNOSIS — Z1283 Encounter for screening for malignant neoplasm of skin: Secondary | ICD-10-CM | POA: Diagnosis not present

## 2015-10-03 DIAGNOSIS — Z872 Personal history of diseases of the skin and subcutaneous tissue: Secondary | ICD-10-CM | POA: Diagnosis not present

## 2015-10-08 ENCOUNTER — Telehealth: Payer: Self-pay | Admitting: Pulmonary Disease

## 2015-10-08 NOTE — Telephone Encounter (Signed)
IMAGING CT CHEST W/O 09/14/15 (personally reviewed by me):  RUL 63mm nodule, 1.3cm nodule posterior RL, & 75mm RML nodule. Suggestion of bronchiectasis. No pleural effusion or thickening. No percaridal effusion. No pathologic mediastinal adenopathy. Right apical calcification & small amount of splenic calcification. Patulous esophagus.

## 2015-10-09 ENCOUNTER — Ambulatory Visit (INDEPENDENT_AMBULATORY_CARE_PROVIDER_SITE_OTHER): Payer: Commercial Managed Care - HMO | Admitting: Pulmonary Disease

## 2015-10-09 ENCOUNTER — Encounter: Payer: Self-pay | Admitting: Pulmonary Disease

## 2015-10-09 ENCOUNTER — Other Ambulatory Visit (INDEPENDENT_AMBULATORY_CARE_PROVIDER_SITE_OTHER): Payer: Commercial Managed Care - HMO

## 2015-10-09 VITALS — BP 124/66 | HR 74 | Ht 63.5 in | Wt 130.6 lb

## 2015-10-09 DIAGNOSIS — J302 Other seasonal allergic rhinitis: Secondary | ICD-10-CM

## 2015-10-09 DIAGNOSIS — J3089 Other allergic rhinitis: Secondary | ICD-10-CM | POA: Diagnosis not present

## 2015-10-09 DIAGNOSIS — J4520 Mild intermittent asthma, uncomplicated: Secondary | ICD-10-CM | POA: Diagnosis not present

## 2015-10-09 DIAGNOSIS — J309 Allergic rhinitis, unspecified: Secondary | ICD-10-CM | POA: Insufficient documentation

## 2015-10-09 LAB — CBC WITH DIFFERENTIAL/PLATELET
BASOS ABS: 0.3 10*3/uL — AB (ref 0.0–0.1)
Basophils Relative: 2.7 % (ref 0.0–3.0)
Eosinophils Absolute: 0.3 10*3/uL (ref 0.0–0.7)
Eosinophils Relative: 2.6 % (ref 0.0–5.0)
HCT: 38.4 % (ref 36.0–46.0)
Hemoglobin: 12.9 g/dL (ref 12.0–15.0)
LYMPHS ABS: 2 10*3/uL (ref 0.7–4.0)
Lymphocytes Relative: 20.6 % (ref 12.0–46.0)
MCHC: 33.5 g/dL (ref 30.0–36.0)
MCV: 86.7 fl (ref 78.0–100.0)
MONO ABS: 0.8 10*3/uL (ref 0.1–1.0)
Monocytes Relative: 7.7 % (ref 3.0–12.0)
NEUTROS PCT: 66.4 % (ref 43.0–77.0)
Neutro Abs: 6.5 10*3/uL (ref 1.4–7.7)
Platelets: 390 10*3/uL (ref 150.0–400.0)
RBC: 4.43 Mil/uL (ref 3.87–5.11)
RDW: 15.8 % — ABNORMAL HIGH (ref 11.5–15.5)
WBC: 9.8 10*3/uL (ref 4.0–10.5)

## 2015-10-09 MED ORDER — MONTELUKAST SODIUM 10 MG PO TABS: 10.0000 mg | ORAL_TABLET | Freq: Every day | ORAL | Status: DC

## 2015-10-09 NOTE — Progress Notes (Signed)
Subjective:    Patient ID: Melissa Elliott, female    DOB: June 04, 1941, 75 y.o.   MRN: [id number]  HPI She reports that on a yearly basis she would get bronchitis as a child. She did have some allergies as a child but these seem to have progressively worsened as she has aged. 75 year old female previously diagnosed with mild, persistent asthma. Also previously treated for bronchitis. Per medical records which are available to me patient appears to have had her Advair increased to 250/50 on 09/13/15 and was started on Zyrtec at the same time. She switched from Zyrtec to Allegra due to increased somnolence. She reports that starting in early January she developed a runny nose with watery eyes while traveling in [place]. She then developed a "hacking, nonproductive cough". She reports her cough seems to be progressively improving. Patient doesn't cough when she sleeps. She coughs when she tries to talk or exerts herself. Denies any wheezing currently but was previously. No chest tightness or pain. She does report a "chest vise" sensation when exposed to certain odors and flowers. No dyspnea now. She reports she hasn't been exercising and walking regularly for the last year due to family changes. She has been on Advair for the last 6 years. Previously was on Serevent. Also previously was on Theophylline. She rarely uses her Proventil inhaler. She is prescribed steroids once a year due to exacerbations of her asthma. She has also been treated with 2 courses of Prednisone recently and IM steroid injection. No reflux, dyspepsia, or morning brash water taste. No recent sinus congestion or pressure. Does have some mild sinus drainage.   Review of Systems No rashes or bruising. No dry eyes, dry mouth, or oral ulcers. A pertinent 14 point review of systems is negative except as per the history of presenting illness.  Allergies  Allergen Reactions  . Ciprofloxacin Nausea And Vomiting    Severe "dry heaves"  .  Sulfa Antibiotics Swelling    Swelling in face, neck, arms, hands.   . Iodine Hives  . Eggs Or Egg-Derived Products Nausea And Vomiting    "sometimes I can eat eggs; sometimes I can't"  . Penicillins Rash    As a child Has patient had a PCN reaction causing immediate rash, facial/tongue/throat swelling, SOB or lightheadedness with hypotension: Unknown Has patient had a PCN reaction causing severe rash involving mucus membranes or skin necrosis: Unknown Has patient had a PCN reaction that required hospitalization: Unknown Has patient had a PCN reaction occurring within the last 10 years: Unknown If all of the above answers are "NO", then may proceed with Cephalosporin use.     Current Outpatient Prescriptions on File Prior to Visit  Medication Sig Dispense Refill  . albuterol (PROVENTIL HFA;VENTOLIN HFA) 108 (90 BASE) MCG/ACT inhaler Inhale 2 puffs into the lungs every 6 (six) hours as needed for wheezing.    Marland Kitchen aspirin (ASPIRIN EC) 81 MG EC tablet Take 81 mg by mouth daily. Swallow whole.    . calcium carbonate (OS-CAL) 600 MG tablet Take 600 mg by mouth daily.    . Cholecalciferol (VITAMIN D) 2000 UNITS CAPS Take 2,000 Units by mouth daily.    . metoprolol succinate (TOPROL-XL) 50 MG 24 hr tablet Take 50 mg by mouth at bedtime.     . nitroGLYCERIN (NITROSTAT) 0.4 MG SL tablet Place 0.4 mg under the tongue every 5 (five) minutes as needed for chest pain.    . simvastatin (ZOCOR) 40 MG tablet Take 40 mg  by mouth at bedtime.     . valsartan (DIOVAN) 160 MG tablet Take 160 mg by mouth daily.     No current facility-administered medications on file prior to visit.    Past Medical History  Diagnosis Date  . Coronary artery disease   . Complication of anesthesia     "takes a long time to wakeup" (02/01/2013)  . High cholesterol     "on RX because of the bypass" (02/01/2013)  . Hypertension   . Heart murmur   . Asthma   . Pneumonia ? 1970's    "once" (02/01/2013)  . Hypoglycemia      "that's something I have to watch constantly" (02/01/2013)  . H/O hiatal hernia   . Sciatic nerve pain 12/2012    "RLE" (02/01/2013)    Past Surgical History  Procedure Laterality Date  . Coronary artery bypass graft  ~ 1997    CABG X2  . Tonsillectomy  1940's  . Appendectomy  1970  . Breast lumpectomy Left ~ 1975  . Skin lesion excision Left 1960's    "upper arm; sore that never healed" (02/01/2013)  . Esophagogastroduodenoscopy N/A 02/02/2013    Procedure: ESOPHAGOGASTRODUODENOSCOPY (EGD);  Surgeon: Wonda Horner, MD;  Location: [facility] ENDOSCOPY;  Service: Endoscopy;  Laterality: N/A;  . Colonoscopy with propofol N/A 06/19/2015    Procedure: COLONOSCOPY WITH PROPOFOL;  Surgeon: Garlan Fair, MD;  Location: [facility] ENDOSCOPY;  Service: Endoscopy;  Laterality: N/A;    Family History  Problem Relation Age of Onset  . Heart attack Father   . CAD Mother   . Hypertension Mother   . Hypertension Father   . Hypertension Maternal Grandmother   . Hypertension Maternal Grandfather   . Stroke Neg Hx   . Asthma Mother     Social History   Social History  . Marital Status: Married    Spouse Name: N/A  . Number of Children: 3  . Years of Education: N/A   Occupational History  . retired    Social History Main Topics  . Smoking status: Passive Smoke Exposure - Never Smoker  . Smokeless tobacco: Never Used     Comment: Exposure through parents.  . Alcohol Use: 0.0 oz/week    0 Standard drinks or equivalent per week     Comment: 02/01/2013 "glass of wine maybe once/month"  . Drug Use: No  . Sexual Activity: Not Currently   Other Topics Concern  . None   Social History Narrative   Originally from [place], [place]. Previously has traveled to [place], [place], [place], [place], [place], [place], [place]. Previously has been to [place] & [place]. 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 No pets currently. Remote bird exposure. No mold or hot tub exposure.       Objective:    Physical Exam BP 124/66 mmHg  Pulse 74  Ht 5' 3.5" (1.613 m)  Wt 130 lb 9.6 oz (59.24 kg)  BMI 22.77 kg/m2  SpO2 97% General:  Awake. Alert. No acute distress. .  Integument:  Warm & dry. No rash on exposed skin. No bruising. Lymphatics:  No appreciated cervical or supraclavicular lymphadenoapthy. HEENT:  Moist mucus membranes. No oral ulcers. No scleral injection or icterus. PERRL. Cardiovascular:  Regular rate. No edema. No appreciable JVD.  Pulmonary:  Good aeration & clear to auscultation bilaterally. Symmetric chest wall expansion. No accessory muscle use. Abdomen: Soft. Normal bowel sounds. Nondistended. Grossly nontender. Musculoskeletal:  Normal bulk and tone. Hand grip strength  5/5 bilaterally. No joint deformity or effusion appreciated. Neurological:  CN 2-12 grossly in tact. No meningismus. Moving all 4 extremities equally. Symmetric brachioradialis deep tendon reflexes. Psychiatric:  Mood and affect congruent. Speech normal rhythm, rate & tone.   PFT 05/21/12: FVC 2.81 L (97%) FEV1 1.98 L (91%) FEV1/FVC 0.71 FEF 25-75 1.21 L (65%) no bronchodilator response TLC 4.92 L (100%) RV 93% DLCO uncorrected 98%  IMAGING CT CHEST W/O 09/14/15 (personally reviewed by me): RUL 84mm nodule, 1.3cm nodule posterior RL, & 70mm RML nodule. Suggestion of bronchiectasis. No pleural effusion or thickening. No percaridal effusion. No pathologic mediastinal adenopathy. Right apical calcification & small amount of splenic calcification. Patulous esophagus.     Assessment & Plan:  75 year old female with known underlying asthma. Patient also has significant seasonal allergies. Seems to be recovering well from her previous exacerbation. I reviewed her chest CT as well as her previous spirometry which shows no evidence for airway obstruction. I do believe she may benefit from the addition of Singulair to her regimen. I instructed her and her husband contact my office if he had any new breathing problems  or questions before her next appointment.  1. Asthma: Continuing Advair & Allegra. Starting Singulair 10 mg by mouth daily at bedtime. Plan to check full pulmonary function testing at next appointment. Ordering serum IgE, CBC with differential, & RAST panel today. 2. Allergic rhinitis: Starting Singulair in addition to [place]. Refer to workup in #1. 3. Health maintenance: Patient received Pneumovax January 2005 & Prevnar January 2017. 4. Follow-up: Patient to return to clinic in 3 months or sooner if needed.  Sonia Baller Ashok Cordia, M.D. [facility] Pager:  [phone] After 3pm or if no response, call [phone] 2:35 PM 10/09/2015

## 2015-10-09 NOTE — Patient Instructions (Addendum)
   Continue Advair & Allegra as prescribed.  We are starting you on Singulair to help with your allergies and asthma.  I will see back in 3 months with a breathing test at that time. Please call my office if you have any questions or concerns.   TESTS ORDERED: 1. Full PFTs at followup appointment 2. Serum IgE, RAST Panel, & CBC w/ differential today

## 2015-10-10 LAB — RESPIRATORY ALLERGY PROFILE REGION II ~~LOC~~
Allergen, Cedar tree, t12: <0.1 kU/L
Allergen, Comm Silver Birch, t9: <0.1 kU/L
Allergen, Oak,t7: <0.1 kU/L
Box Elder IgE: <0.1 kU/L
Cat Dander: <0.1 kU/L
Cockroach: <0.1 kU/L
Dog Dander: <0.1 kU/L
Elm IgE: <0.1 kU/L
IgE (Immunoglobulin E), Serum: 9 kU/L (ref <115)
Penicillium Notatum: <0.1 kU/L
Sheep Sorrel IgE: <0.1 kU/L

## 2015-11-21 ENCOUNTER — Encounter: Payer: Self-pay | Admitting: *Deleted

## 2015-11-26 ENCOUNTER — Ambulatory Visit (INDEPENDENT_AMBULATORY_CARE_PROVIDER_SITE_OTHER): Payer: Commercial Managed Care - HMO | Admitting: Interventional Cardiology

## 2015-11-26 ENCOUNTER — Encounter: Payer: Self-pay | Admitting: Interventional Cardiology

## 2015-11-26 VITALS — BP 148/75 | HR 80 | Ht 63.5 in | Wt 133.0 lb

## 2015-11-26 DIAGNOSIS — R931 Abnormal findings on diagnostic imaging of heart and coronary circulation: Secondary | ICD-10-CM | POA: Diagnosis not present

## 2015-11-26 DIAGNOSIS — Z951 Presence of aortocoronary bypass graft: Secondary | ICD-10-CM | POA: Diagnosis not present

## 2015-11-26 DIAGNOSIS — I10 Essential (primary) hypertension: Secondary | ICD-10-CM | POA: Diagnosis not present

## 2015-11-26 NOTE — Progress Notes (Signed)
Patient ID: Melissa Elliott, female   DOB: [birth year], 75 y.o.   MRN: [id number]     Cardiology Office Note   Date:  11/26/2015   ID:  Melissa Elliott, DOB August 24, 1940, MRN [id number]  PCP:  Wenda Low, MD    No chief complaint on file. f/u CAD   Wt Readings from Last 3 Encounters:  11/26/15 133 lb (60.328 kg)  10/09/15 130 lb 9.6 oz (59.24 kg)  08/28/15 128 lb 6.4 oz (58.242 kg)       History of Present Illness: Melissa Elliott is a 75 y.o. female  with CAD. CABG in 1996. BPs at home are usually 125-135/ 60s. She had several stomach ulcers which were healing by EGD several years ago; likely secondary to meloxicam. Not taking NSAIDs now.  She has had a difficult year in some respects.  She felt a little A little throat tightness in Jan 2017. We performed a stress test. This showed a questionable inferior wall defect.  She has been very active without any sx like she had before CABG.  She walks a lot.  She walked a 5 K and had no problems.  She went to [place] and walked a lot.  Her asthma is much better controlled with Singulair.  She walked up hundreds of stairs in [place] and had no problems.      Past Medical History  Diagnosis Date  . Coronary artery disease   . Complication of anesthesia     "takes a long time to wakeup" (02/01/2013)  . High cholesterol     "on RX because of the bypass" (02/01/2013)  . Hypertension   . Heart murmur   . Asthma   . Pneumonia ? 1970's    "once" (02/01/2013)  . Hypoglycemia     "that's something I have to watch constantly" (02/01/2013)  . H/O hiatal hernia   . Sciatic nerve pain 12/2012    "RLE" (02/01/2013)    Past Surgical History  Procedure Laterality Date  . Coronary artery bypass graft  ~ 1997    CABG X2  . Tonsillectomy  1940's  . Appendectomy  1970  . Breast lumpectomy Left ~ 1975  . Skin lesion excision Left 1960's    "upper arm; sore that never healed" (02/01/2013)  . Esophagogastroduodenoscopy N/A 02/02/2013    Procedure: ESOPHAGOGASTRODUODENOSCOPY (EGD);  Surgeon: Wonda Horner, MD;  Location[facility] ENDOSCOPY;  Service: Endoscopy;  Laterality: N/A;  . Colonoscopy with propofol N/A 06/19/2015    Procedure: COLONOSCOPY WITH PROPOFOL;  Surgeon: Garlan Fair, MD;  Location: [facility] ENDOSCOPY;  Service: Endoscopy;  Laterality: N/A;     Current Outpatient Prescriptions  Medication Sig Dispense Refill  . ADVAIR DISKUS 250-50 MCG/DOSE AEPB Inhale 1 puff into the lungs 2 (two) times daily.  11  . albuterol (PROVENTIL HFA;VENTOLIN HFA) 108 (90 BASE) MCG/ACT inhaler Inhale 2 puffs into the lungs every 6 (six) hours as needed for wheezing.    Marland Kitchen aspirin (ASPIRIN EC) 81 MG EC tablet Take 81 mg by mouth daily. Swallow whole.    . calcium carbonate (OS-CAL) 600 MG tablet Take 600 mg by mouth daily.    . Cholecalciferol (VITAMIN D) 2000 UNITS CAPS Take 2,000 Units by mouth daily.    . fexofenadine (ALLEGRA) 30 MG tablet Take 30 mg by mouth daily as needed.    Marland Kitchen Fexofenadine-Pseudoephedrine (ALLEGRA-D 24 HOUR PO) Take 1 tablet by mouth daily.    . metoprolol succinate (TOPROL-XL) 50 MG 24 hr tablet Take  50 mg by mouth at bedtime.     . montelukast (SINGULAIR) 10 MG tablet Take 1 tablet (10 mg total) by mouth at bedtime. 30 tablet 3  . nitroGLYCERIN (NITROSTAT) 0.4 MG SL tablet Place 0.4 mg under the tongue every 5 (five) minutes as needed for chest pain (UP TO 3 DOSES BEFORE CALLING EMS).    . simvastatin (ZOCOR) 40 MG tablet Take 40 mg by mouth at bedtime.     . valsartan (DIOVAN) 160 MG tablet Take 160 mg by mouth daily.     No current facility-administered medications for this visit.    Allergies:   Ciprofloxacin; Sulfa antibiotics; Iodine; Eggs or egg-derived products; and Penicillins    Social History:  The patient  reports that she has never smoked. She has never used smokeless tobacco. She reports that she drinks alcohol. She reports that she does not use illicit drugs.   Family History:  The patient's  family history includes Asthma in her mother; CAD in her mother; Heart attack in her father; Hypertension in her father, maternal grandfather, maternal grandmother, and mother. There is no history of Stroke.    ROS:  Please see the history of present illness.   Otherwise, review of systems are positive for improved asthma sx.   All other systems are reviewed and negative.    PHYSICAL EXAM: VS:  BP 148/75 mmHg  Pulse 80  Ht 5' 3.5" (1.613 m)  Wt 133 lb (60.328 kg)  BMI 23.19 kg/m2 , BMI Body mass index is 23.19 kg/(m^2). GEN: Well nourished, well developed, in no acute distress HEENT: normal Neck: no JVD, carotid bruits, or masses Cardiac: RRR; no murmurs, rubs, or gallops,no edema  Respiratory:  clear to auscultation bilaterally, normal work of breathing GI: soft, nontender, nondistended, + BS MS: no deformity or atrophy Skin: warm and dry, no rash Neuro:  Strength and sensation are intact Psych: euthymic mood, full affect   EKG:   The ekg ordered today demonstrates    Recent Labs: 10/09/2015: Hemoglobin 12.9; Platelets 390.0   Lipid Panel No results found for: CHOL, TRIG, HDL, CHOLHDL, VLDL, LDLCALC, LDLDIRECT   Other studies Reviewed: Additional studies/ records that were reviewed today with results demonstrating: Stress test from January 2017 reviewed.   ASSESSMENT AND PLAN:  1. CAD with angina/ mildly abnormal stress test:use nitroglycerin as needed. We discussed cardiac cath But would only consider doing this after her asthma symptoms have resolved. Her stress test was low risk. THis showed a possible small area of ischemia.  She felt worse on isosorbide.  Now that her asthma is controlled, she feels much better. She has been very active recently.  Watch for any change in exercise tolerance. 2. Hypertension: She checks her blood pressure at home. Systolics are typically in the 130s. She'll continue to check. 3. Hyperlipidemia: Continue current medicines. This is  followed by Dr. Deforest Hoyles   Current medicines are reviewed at length with the patient today.  The patient concerns regarding her medicines were addressed.  The following changes have been made:  No change  Labs/ tests ordered today include:  No orders of the defined types were placed in this encounter.    Recommend 150 minutes/week of aerobic exercise Low fat, low carb, high fiber diet recommended  Disposition:   FU in one year   Signed, Larae Grooms, MD  11/26/2015 9:55 AM    [facility] [address], [place], [place]  [address] Phone: [phone]; Fax: [phone]     

## 2015-11-26 NOTE — Patient Instructions (Signed)
Medication Instructions:  Same-no changes  Labwork: None  Testing/Procedures: None  Follow-Up: Your physician wants you to follow-up in: 1 year f/u. You will receive a reminder letter in the mail two months in advance. If you don't receive a letter, please call our office to schedule the follow-up appointment.     If you need a refill on your cardiac medications before your next appointment, please call your pharmacy.

## 2016-01-08 ENCOUNTER — Ambulatory Visit (INDEPENDENT_AMBULATORY_CARE_PROVIDER_SITE_OTHER): Payer: Commercial Managed Care - HMO | Admitting: Pulmonary Disease

## 2016-01-08 ENCOUNTER — Encounter: Payer: Self-pay | Admitting: Pulmonary Disease

## 2016-01-08 VITALS — BP 124/68 | HR 85 | Ht 63.0 in | Wt 132.0 lb

## 2016-01-08 DIAGNOSIS — J45909 Unspecified asthma, uncomplicated: Secondary | ICD-10-CM

## 2016-01-08 DIAGNOSIS — J309 Allergic rhinitis, unspecified: Secondary | ICD-10-CM | POA: Diagnosis not present

## 2016-01-08 DIAGNOSIS — J449 Chronic obstructive pulmonary disease, unspecified: Secondary | ICD-10-CM

## 2016-01-08 DIAGNOSIS — R911 Solitary pulmonary nodule: Secondary | ICD-10-CM | POA: Diagnosis not present

## 2016-01-08 DIAGNOSIS — J4520 Mild intermittent asthma, uncomplicated: Secondary | ICD-10-CM | POA: Diagnosis not present

## 2016-01-08 LAB — PULMONARY FUNCTION TEST
DL/VA % PRED: 70 %
DL/VA: 3.29 ml/min/mmHg/L
DLCO cor % pred: 65 %
DLCO cor: 15.12 ml/min/mmHg
DLCO unc % pred: 67 %
DLCO unc: 15.56 ml/min/mmHg
FEF 25-75 Post: 1.06 L/sec
FEF 25-75 Pre: 0.97 L/sec
FEF2575-%CHANGE-POST: 8 %
FEF2575-%Pred-Post: 63 %
FEF2575-%Pred-Pre: 58 %
FEV1-%Change-Post: 1 %
FEV1-%PRED-PRE: 83 %
FEV1-%Pred-Post: 84 %
FEV1-PRE: 1.72 L
FEV1-Post: 1.75 L
FEV1FVC-%CHANGE-POST: 0 %
FEV1FVC-%Pred-Pre: 92 %
FEV6-%Change-Post: 1 %
FEV6-%PRED-PRE: 95 %
FEV6-%Pred-Post: 97 %
FEV6-POST: 2.53 L
FEV6-PRE: 2.49 L
FEV6FVC-%Change-Post: 0 %
FEV6FVC-%PRED-POST: 104 %
FEV6FVC-%PRED-PRE: 105 %
FVC-%CHANGE-POST: 2 %
FVC-%PRED-POST: 93 %
FVC-%PRED-PRE: 90 %
FVC-POST: 2.55 L
FVC-PRE: 2.49 L
POST FEV6/FVC RATIO: 99 %
Post FEV1/FVC ratio: 68 %
Pre FEV1/FVC ratio: 69 %
Pre FEV6/FVC Ratio: 100 %
RV % PRED: 102 %
RV: 2.26 L
TLC % pred: 102 %
TLC: 5.04 L

## 2016-01-08 NOTE — Patient Instructions (Signed)
   Use your peak flow meter twice daily for 2 weeks. Then go to using it once daily and pay particular attention if your number drops by 20%.  Call me if you have any new breathing problems before your next appointment.  I will see you back in 3 months or sooner if needed.  TESTS ORDERED: 1. Spirometry with bronchodilator challenge at next appointment 2. CT Chest w/o contrast September 2017

## 2016-01-08 NOTE — Progress Notes (Signed)
PFT done today. 

## 2016-01-08 NOTE — Progress Notes (Signed)
Subjective:    Patient ID: Melissa Elliott, female    DOB: [birth year], 75 y.o.   MRN: [id number]  C.C.:  Follow-up for Asthma w/ Mild Airways Obstruction, Right Lung Nodules, & Allergic Rhinitis.  HPI Asthma w/ Mild Airways Obstruction:  Started Singulair at last appointment. Continued on Advair and Allegra at last appointment. She reports her breathing has significantly improved. Denies any coughing or wheezing. Reports she did a 5K marathon since last appointment. No exacerbations since last appointment. Rare use of her rescue inhaler. No nocturnal awakenings with any breathing problems. Stopped Advair & Allegra in March.   Right Lung Nodules: Seen on CT imaging March 2017. Repeat Chest CT scan in September 2017.  Allergic Rhinitis:  On Allegra previously. Started Singulair at last appointment. No sinus congestion or pressure.  Review of Systems No chest pain, pressure, or tightness. No feer, chills, or sweats. No rashes or bruising.   Allergies  Allergen Reactions  . Ciprofloxacin Nausea And Vomiting    Severe "dry heaves"  . Sulfa Antibiotics Swelling    Swelling in face, neck, arms, hands.   . Iodine Hives  . Eggs Or Egg-Derived Products Nausea And Vomiting    "sometimes I can eat eggs; sometimes I can't"  . Penicillins Rash    As a child Has patient had a PCN reaction causing immediate rash, facial/tongue/throat swelling, SOB or lightheadedness with hypotension: Unknown Has patient had a PCN reaction causing severe rash involving mucus membranes or skin necrosis: Unknown Has patient had a PCN reaction that required hospitalization: Unknown Has patient had a PCN reaction occurring within the last 10 years: Unknown If all of the above answers are "NO", then may proceed with Cephalosporin use.     Current Outpatient Prescriptions on File Prior to Visit  Medication Sig Dispense Refill  . ADVAIR DISKUS 250-50 MCG/DOSE AEPB Inhale 1 puff into the lungs as needed. Only uses in  winter and spring months  11  . albuterol (PROVENTIL HFA;VENTOLIN HFA) 108 (90 BASE) MCG/ACT inhaler Inhale 2 puffs into the lungs every 6 (six) hours as needed for wheezing.    Marland Kitchen aspirin (ASPIRIN EC) 81 MG EC tablet Take 81 mg by mouth daily. Swallow whole.    . calcium carbonate (OS-CAL) 600 MG tablet Take 600 mg by mouth daily.    . Cholecalciferol (VITAMIN D) 2000 UNITS CAPS Take 2,000 Units by mouth daily.    . fexofenadine (ALLEGRA) 30 MG tablet Take 30 mg by mouth daily as needed.    . metoprolol succinate (TOPROL-XL) 50 MG 24 hr tablet Take 50 mg by mouth at bedtime.     . montelukast (SINGULAIR) 10 MG tablet Take 1 tablet (10 mg total) by mouth at bedtime. 30 tablet 3  . nitroGLYCERIN (NITROSTAT) 0.4 MG SL tablet Place 0.4 mg under the tongue every 5 (five) minutes as needed for chest pain (UP TO 3 DOSES BEFORE CALLING EMS).    . simvastatin (ZOCOR) 40 MG tablet Take 40 mg by mouth at bedtime.     . valsartan (DIOVAN) 160 MG tablet Take 160 mg by mouth daily.     No current facility-administered medications on file prior to visit.    Past Medical History  Diagnosis Date  . Coronary artery disease   . Complication of anesthesia     "takes a long time to wakeup" (02/01/2013)  . High cholesterol     "on RX because of the bypass" (02/01/2013)  . Hypertension   . Heart  murmur   . Asthma   . Pneumonia ? 1970's    "once" (02/01/2013)  . Hypoglycemia     "that's something I have to watch constantly" (02/01/2013)  . H/O hiatal hernia   . Sciatic nerve pain 12/2012    "RLE" (02/01/2013)    Past Surgical History  Procedure Laterality Date  . Coronary artery bypass graft  ~ 1997    CABG X2  . Tonsillectomy  1940's  . Appendectomy  1970  . Breast lumpectomy Left ~ 1975  . Skin lesion excision Left 1960's    "upper arm; sore that never healed" (02/01/2013)  . Esophagogastroduodenoscopy N/A 02/02/2013    Procedure: ESOPHAGOGASTRODUODENOSCOPY (EGD);  Surgeon: Wonda Horner, MD;   Location: [facility] ENDOSCOPY;  Service: Endoscopy;  Laterality: N/A;  . Colonoscopy with propofol N/A 06/19/2015    Procedure: COLONOSCOPY WITH PROPOFOL;  Surgeon: Garlan Fair, MD;  Location: [facility] ENDOSCOPY;  Service: Endoscopy;  Laterality: N/A;    Family History  Problem Relation Age of Onset  . Heart attack Father   . CAD Mother   . Hypertension Mother   . Hypertension Father   . Hypertension Maternal Grandmother   . Hypertension Maternal Grandfather   . Stroke Neg Hx   . Asthma Mother     Social History   Social History  . Marital Status: Married    Spouse Name: N/A  . Number of Children: 3  . Years of Education: N/A   Occupational History  . retired    Social History Main Topics  . Smoking status: Never Smoker   . Smokeless tobacco: Never Used     Comment: Exposure through parents.  . Alcohol Use: 0.0 oz/week    0 Standard drinks or equivalent per week     Comment: 02/01/2013 "glass of wine maybe once/month"  . Drug Use: No  . Sexual Activity: Not Currently   Other Topics Concern  . None   Social History Narrative   Originally from [place], [place]. Previously has traveled to [place], [place], [place], [place], [place], [place], [place]. Previously has been to [place] & [place]. 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 No pets currently. Remote bird exposure. No mold or hot tub exposure.       Objective:   Physical Exam BP 124/68 mmHg  Pulse 85  Ht 5\' 3"  (1.6 m)  Wt 132 lb (59.875 kg)  BMI 23.39 kg/m2  SpO2 95% General:  Awake. Alert. No distress.  Integument:  Warm & dry. No rash on exposed skin.  Lymphatics:  No appreciated cervical or supraclavicular lymphadenoapthy. HEENT:  Moist mucus membranes. No oral ulcers. Mild bilateral nasal turbinate swelling. Cardiovascular:  Regular rate. No edema. Normal S1 & S2.  Pulmonary:  Clear bilaterally to auscultation. Normal work of breathing on room air. Speaking in complete  sentences. Musculoskeletal:  Normal bulk and tone.  No joint deformity or effusion appreciated.  PFT 01/08/16: FVC 2.49 L (90%) FEV1 1.72 L (83%) FEV1/FVC 0.69 FEF 25-75 0.97 L (88%) no bronchodilator response TLC 5.04 L (102%) RV 102% ERV 66% DLCO corrected 65% (hemoglobin 14.4) 05/21/12: FVC 2.81 L (97%) FEV1 1.98 L (91%) FEV1/FVC 0.71 FEF 25-75 1.21 L (65%) no bronchodilator response TLC 4.92 L (100%) RV 93%                    DLCO uncorrected 98%  IMAGING CT CHEST W/O 09/14/15 (previously reviewed by me): RUL 87mm nodule, 1.3cm nodule posterior RL, &  41mm RML nodule. Suggestion of bronchiectasis. No pleural effusion or thickening. No percaridal effusion. No pathologic mediastinal adenopathy. Right apical calcification & small amount of splenic calcification. Patulous esophagus.   LABS 10/09/15 CBC: 9.8/12.9/38.4/390 Eosinophils: 0.3 IgE: 9 RAST Panel: Negative    Assessment & Plan:  75 year old female with known underlying asthma. Fixed airway obstruction. I discussed this with the patient as it likely represents remodeling the patient's airways from her long-standing history of asthma. Symptomatically she is well-controlled at this time with regards to her asthma as well as her allergic rhinitis. We will continue seasonal medication for treatment of both of these diseases. She will continue to monitor her pulmonary function with a peak flow meter at home. I also discussed the lung nodules present on her previous CT imaging and we will follow up with chest imaging at 6 months as recommended by current guidelines. I instructed the patient contact my office if she had any new breathing problems or questions before her next appointment.  1. Asthma w/ Mild Airways Obstruction: Prescribed patient peak flow meter to use twice daily and monitor lung function. Continuing seasonal use of Allegra, Advair, and Singulair. Repeat spirometry with bronchodilator challenge at next appointment. 2. Right Lung  Nodules: Repeat CT chest without contrast September 2017. 3. Allergic Rhinitis:  Continuing seasonal Allegra & Singulair. No changes. 4. Health maintenance: Patient received Pneumovax January 2005 & Prevnar January 2017. 5. Follow-up: Patient to return to clinic in 3 months or sooner if needed.  Sonia Baller Ashok Cordia, M.D. [facility] Pulmonary & Critical Care Pager:  [phone] After 3pm or if no response, call [phone] 2:18 PM 01/08/2016

## 2016-01-08 NOTE — Addendum Note (Signed)
Addended by: Inge Rise on: 01/08/2016 02:48 PM   Modules accepted: Orders

## 2016-01-08 NOTE — Addendum Note (Signed)
Addended by: Inge Rise on: 01/08/2016 02:35 PM   Modules accepted: Orders

## 2016-03-20 ENCOUNTER — Other Ambulatory Visit: Payer: Self-pay | Admitting: Pulmonary Disease

## 2016-03-27 ENCOUNTER — Ambulatory Visit (INDEPENDENT_AMBULATORY_CARE_PROVIDER_SITE_OTHER)
Admission: RE | Admit: 2016-03-27 | Discharge: 2016-03-27 | Disposition: A | Discharge status: Home / Self Care | Payer: Commercial Managed Care - HMO | Source: Ambulatory Visit | Attending: Pulmonary Disease | Admitting: Pulmonary Disease

## 2016-03-27 DIAGNOSIS — R918 Other nonspecific abnormal finding of lung field: Secondary | ICD-10-CM | POA: Diagnosis not present

## 2016-03-27 DIAGNOSIS — R911 Solitary pulmonary nodule: Secondary | ICD-10-CM | POA: Diagnosis not present

## 2016-04-07 ENCOUNTER — Inpatient Hospital Stay: Admission: RE | Admit: 2016-04-07 | Payer: Commercial Managed Care - HMO | Source: Ambulatory Visit

## 2016-04-18 ENCOUNTER — Ambulatory Visit (INDEPENDENT_AMBULATORY_CARE_PROVIDER_SITE_OTHER): Payer: Commercial Managed Care - HMO | Admitting: Pulmonary Disease

## 2016-04-18 ENCOUNTER — Telehealth: Payer: Self-pay | Admitting: Pulmonary Disease

## 2016-04-18 ENCOUNTER — Encounter: Payer: Self-pay | Admitting: Pulmonary Disease

## 2016-04-18 VITALS — BP 130/66 | HR 77 | Ht 63.0 in | Wt 133.0 lb

## 2016-04-18 DIAGNOSIS — J45909 Unspecified asthma, uncomplicated: Secondary | ICD-10-CM

## 2016-04-18 DIAGNOSIS — J4540 Moderate persistent asthma, uncomplicated: Secondary | ICD-10-CM | POA: Diagnosis not present

## 2016-04-18 DIAGNOSIS — J3089 Other allergic rhinitis: Secondary | ICD-10-CM | POA: Diagnosis not present

## 2016-04-18 DIAGNOSIS — R918 Other nonspecific abnormal finding of lung field: Secondary | ICD-10-CM

## 2016-04-18 LAB — PULMONARY FUNCTION TEST
FEF 25-75 POST: 0.91 L/s
FEF 25-75 PRE: 0.97 L/s
FEF2575-%Change-Post: -6 %
FEF2575-%PRED-POST: 55 %
FEF2575-%Pred-Pre: 59 %
FEV1-%Change-Post: 0 %
FEV1-%PRED-POST: 84 %
FEV1-%Pred-Pre: 84 %
FEV1-POST: 1.74 L
FEV1-Pre: 1.73 L
FEV1FVC-%CHANGE-POST: 1 %
FEV1FVC-%PRED-PRE: 91 %
FEV6-%Change-Post: 0 %
FEV6-%Pred-Post: 95 %
FEV6-%Pred-Pre: 96 %
FEV6-Post: 2.49 L
FEV6-Pre: 2.5 L
FEV6FVC-%CHANGE-POST: 0 %
FEV6FVC-%Pred-Post: 105 %
FEV6FVC-%Pred-Pre: 104 %
FVC-%Change-Post: -1 %
FVC-%PRED-POST: 91 %
FVC-%Pred-Pre: 92 %
FVC-Post: 2.49 L
FVC-Pre: 2.53 L
POST FEV1/FVC RATIO: 70 %
PRE FEV6/FVC RATIO: 99 %
Post FEV6/FVC ratio: 100 %
Pre FEV1/FVC ratio: 69 %

## 2016-04-18 MED ORDER — MONTELUKAST SODIUM 10 MG PO TABS: 10.0000 mg | ORAL_TABLET | Freq: Every day | ORAL | 3 refills | Status: DC

## 2016-04-18 MED ORDER — ALBUTEROL SULFATE HFA 108 (90 BASE) MCG/ACT IN AERS: 2.0000 | INHALATION_SPRAY | Freq: Four times a day (QID) | RESPIRATORY_TRACT | 3 refills | Status: AC | PRN

## 2016-04-18 NOTE — Progress Notes (Signed)
Subjective:    Patient ID: Melissa Elliott, female    DOB: [birth year], 75 y.o.   MRN: [id number]  C.C.:  Follow-up for Moderate, Persistent Asthma w/ Mild Airways Obstruction, Right Lung Nodules, & Chronic Allergic Rhinitis.  HPI Moderate, Persistent Asthma w/ Mild Airways Obstruction:  Prescribed Allegra, Singulair, & Advair. She reports she developed a "cough" that began after she traveled to [place] to visit her grandchildren all of which had colds. Cough is nonproductive. No nocturnal awakenings with coughing. No wheezing. Rare need for her rescue inhaler. No exacerbations since last appointment.  Right Lung Nodules: Seen on CT imaging March 2017. Right lower lobe nodule has resolved on repeat CT imaging as of September 14. Patient still has a tiny bilateral nodules present one of which is calcified.  Chronic Allergic Rhinitis:  On Allegra and Singulair. No sinus congestion, pressure, or drainage.   Review of Systems No fever, chills, or sweats. Chronic hot flashes. No chest tightness, pressure or pain. No nausea, emesis or abdominal pain.   Allergies  Allergen Reactions  . Ciprofloxacin Nausea And Vomiting    Severe "dry heaves"  . Sulfa Antibiotics Swelling    Swelling in face, neck, arms, hands.   . Iodine Hives  . Eggs Or Egg-Derived Products Nausea And Vomiting    "sometimes I can eat eggs; sometimes I can't"  . Penicillins Rash    As a child Has patient had a PCN reaction causing immediate rash, facial/tongue/throat swelling, SOB or lightheadedness with hypotension: Unknown Has patient had a PCN reaction causing severe rash involving mucus membranes or skin necrosis: Unknown Has patient had a PCN reaction that required hospitalization: Unknown Has patient had a PCN reaction occurring within the last 10 years: Unknown If all of the above answers are "NO", then may proceed with Cephalosporin use.     Current Outpatient Prescriptions on File Prior to Visit  Medication  Sig Dispense Refill  . ADVAIR DISKUS 250-50 MCG/DOSE AEPB Inhale 1 puff into the lungs as needed. Only uses in winter and spring months  11  . albuterol (PROVENTIL HFA;VENTOLIN HFA) 108 (90 BASE) MCG/ACT inhaler Inhale 2 puffs into the lungs every 6 (six) hours as needed for wheezing.    Marland Kitchen aspirin (ASPIRIN EC) 81 MG EC tablet Take 81 mg by mouth daily. Swallow whole.    . calcium carbonate (OS-CAL) 600 MG tablet Take 600 mg by mouth daily.    . Cholecalciferol (VITAMIN D) 2000 UNITS CAPS Take 2,000 Units by mouth daily.    . fexofenadine (ALLEGRA) 30 MG tablet Take 30 mg by mouth daily as needed.    . metoprolol succinate (TOPROL-XL) 50 MG 24 hr tablet Take 50 mg by mouth at bedtime.     . montelukast (SINGULAIR) 10 MG tablet TAKE 1 TABLET BY MOUTH AT BEDTIME 30 tablet 3  . nitroGLYCERIN (NITROSTAT) 0.4 MG SL tablet Place 0.4 mg under the tongue every 5 (five) minutes as needed for chest pain (UP TO 3 DOSES BEFORE CALLING EMS).    . simvastatin (ZOCOR) 40 MG tablet Take 40 mg by mouth at bedtime.     . valsartan (DIOVAN) 160 MG tablet Take 160 mg by mouth daily.     No current facility-administered medications on file prior to visit.     Past Medical History:  Diagnosis Date  . Asthma   . Complication of anesthesia    "takes a long time to wakeup" (02/01/2013)  . Coronary artery disease   . H/O  hiatal hernia   . Heart murmur   . High cholesterol    "on RX because of the bypass" (02/01/2013)  . Hypertension   . Hypoglycemia    "that's something I have to watch constantly" (02/01/2013)  . Pneumonia ? 1970's   "once" (02/01/2013)  . Sciatic nerve pain 12/2012   "RLE" (02/01/2013)    Past Surgical History:  Procedure Laterality Date  . APPENDECTOMY  1970  . BREAST LUMPECTOMY Left ~ 1975  . COLONOSCOPY WITH PROPOFOL N/A 06/19/2015   Procedure: COLONOSCOPY WITH PROPOFOL;  Surgeon: Garlan Fair, MD;  Location: [facility] ENDOSCOPY;  Service: Endoscopy;  Laterality: N/A;  . CORONARY ARTERY  BYPASS GRAFT  ~ 1997   CABG X2  . ESOPHAGOGASTRODUODENOSCOPY N/A 02/02/2013   Procedure: ESOPHAGOGASTRODUODENOSCOPY (EGD);  Surgeon: Wonda Horner, MD;  Location: [facility] ENDOSCOPY;  Service: Endoscopy;  Laterality: N/A;  . SKIN LESION EXCISION Left 1960's   "upper arm; sore that never healed" (02/01/2013)  . TONSILLECTOMY  1940's    Family History  Problem Relation Age of Onset  . Heart attack Father   . CAD Mother   . Hypertension Mother   . Hypertension Father   . Hypertension Maternal Grandmother   . Hypertension Maternal Grandfather   . Stroke Neg Hx   . Asthma Mother     Social History   Social History  . Marital status: Married    Spouse name: N/A  . Number of children: 3  . Years of education: N/A   Occupational History  . retired    Social History Main Topics  . Smoking status: Never Smoker  . Smokeless tobacco: Never Used     Comment: Exposure through parents.  . Alcohol use 0.0 oz/week     Comment: 02/01/2013 "glass of wine maybe once/month"  . Drug use: No  . Sexual activity: Not Currently   Other Topics Concern  . None   Social History Narrative   Originally from [place], [place]. Previously has traveled to [place], [place], [place], [place], [place], [place], [place]. Previously has been to [place] & [place]. 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 No pets currently. Remote bird exposure. No mold or hot tub exposure.       Objective:   Physical Exam BP 130/66 (BP Location: Left Arm, Cuff Size: Normal)   Pulse 77   Ht 5\' 3"  (1.6 m)   Wt 133 lb (60.3 kg)   SpO2 97%   BMI 23.56 kg/m  General:  Awake. Alert. No distress. Comfortable. Integument:  Warm & dry. No rash on exposed skin.  Lymphatics:  No appreciated cervical or supraclavicular lymphadenoapthy. HEENT:  Moist mucus membranes. No oral ulcers. No nasal turbinate swelling. Cardiovascular:  Regular rate. No edema. Normal S1 & S2.  Pulmonary:  Good aeration bilaterally. Clear on  auscultation with normal work of breathing. Musculoskeletal:  Normal bulk and tone.  No joint deformity.  PFT 04/18/16: FVC 2.53 L (92%) FEV1 1.73 L (84%) FEV1/FVC 0.69 FEF 25-75 0.97 L (59%) negative bronchodilator response 01/08/16: FVC 2.49 L (90%) FEV1 1.72 L (83%) FEV1/FVC 0.69 FEF 25-75 0.97 L (88%) negative bronchodilator response TLC 5.04 L (102%) RV 102% ERV 66% DLCO corrected 65% (hemoglobin 14.4) 05/21/12: FVC 2.81 L (97%) FEV1 1.98 L (91%) FEV1/FVC 0.71 FEF 25-75 1.21 L (65%) negative bronchodilator response TLC 4.92 L (100%) RV 93%                    DLCO  uncorrected 98%  IMAGING CT CHEST W/O 03/27/16 (personally reviewed by me): Right lower lobe nodule has resolved. Small amount of linear scar tissue formation within right middle lobe noted. 2 mm nodule in right lower lobe calcified. 2 mm nodule right upper lobe. 3 mm left lower lobe nodule noted. Pleural parenchymal scarring in the apices noted again. No pathologic mediastinal adenopathy. Splenic calcifications consistent with prior granulomatous disease.  CT CHEST W/O 09/14/15 (previously reviewed by me): RUL 40mm nodule, 1.3cm nodule posterior RL, & 17mm RML nodule. Suggestion of bronchiectasis. No pleural effusion or thickening. No percaridal effusion. No pathologic mediastinal adenopathy. Right apical calcification & small amount of splenic calcification. Patulous esophagus.   LABS 10/09/15 CBC: 9.8/12.9/38.4/390 Eosinophils: 0.3 IgE: 9 RAST Panel: Negative    Assessment & Plan:  75 y.o. female with known underlying moderate, persistent asthma with fixed airway obstruction. Patient's spirometry has remained stable since previous testing with continued mild fixed airway obstruction. Symptomatically she seems to be doing well despite her recent acute illness and without wheezing I do not feel that oral steroid medication as necessary. Her allergic rhinitis is well controlled at this time. We did review her chest CT scan which  continues to show tiny bilateral pulmonary nodules 1 which is calcified and many of whom are likely secondary to granulomatous disease given previous resolution of right lower lobe lung nodule. The patient declines further chest imaging at this time given the low risk of developing malignancy and I feel this is reasonable. I instructed the patient contact my office if she had any new breathing problems or questions before next appointment.   1. Moderate, Persistent Asthma: Continuing patient on Singulair and Advair 250/50. Continue de-escalating inhaled steroid to 100/50 at next appointment if symptoms remain stable. 2. Right Lung Nodules: Patient declining further chest imaging. Plan to reimage for any new symptoms. 3. Chronic Allergic Rhinitis:  Continuing seasonal Allegra & Singulair. No changes. 4. Health Maintenance: Patient received Pneumovax January 2005 & Prevnar January 2017. Previous reaction to influenza vaccine with egg sensitivity. 5. Follow-up: Patient to return to clinic in 6 months or sooner if needed.  Sonia Baller Ashok Cordia, M.D. [facility] Pulmonary & Critical Care Pager:  [phone] After 3pm or if no response, call [phone] 4:41 PM 04/18/16

## 2016-04-18 NOTE — Patient Instructions (Signed)
   Continue taking your medications as prescribed.  Call me if your cough gets worse or doesn't resolve.  I will see you back in 6 months or sooner if needed.

## 2016-04-18 NOTE — Progress Notes (Signed)
Test reviewed.  

## 2016-04-18 NOTE — Telephone Encounter (Signed)
IMAGING CT CHEST W/O 03/27/16 (personally reviewed by me): Right lower lobe nodule has resolved. Small amount of linear scar tissue formation within right middle lobe noted. 2 mm nodule in right lower lobe calcified. 2 mm nodule right upper lobe. 3 mm left lower lobe nodule noted. Pleural parenchymal scarring in the apices noted again. No pathologic mediastinal adenopathy. Splenic calcifications consistent with prior granulomatous disease.

## 2016-04-22 ENCOUNTER — Encounter: Payer: Self-pay | Admitting: Pulmonary Disease

## 2016-04-22 ENCOUNTER — Ambulatory Visit (INDEPENDENT_AMBULATORY_CARE_PROVIDER_SITE_OTHER): Payer: Commercial Managed Care - HMO | Admitting: Pulmonary Disease

## 2016-04-22 DIAGNOSIS — R05 Cough: Secondary | ICD-10-CM | POA: Diagnosis not present

## 2016-04-22 DIAGNOSIS — R053 Chronic cough: Secondary | ICD-10-CM | POA: Insufficient documentation

## 2016-04-22 DIAGNOSIS — J4530 Mild persistent asthma, uncomplicated: Secondary | ICD-10-CM | POA: Diagnosis not present

## 2016-04-22 MED ORDER — METHYLPREDNISOLONE ACETATE 80 MG/ML IJ SUSP
80.0000 mg | Freq: Once | INTRAMUSCULAR | Status: AC
  Administered 2016-04-22: 80 mg via INTRAMUSCULAR

## 2016-04-22 MED ORDER — PREDNISONE 10 MG PO TABS: ORAL_TABLET | ORAL | 0 refills | Status: DC

## 2016-04-22 NOTE — Assessment & Plan Note (Signed)
Medrol 80 mg IM  Prednisone 10 mg tabs  Take 4 tabs  daily with food x 4 days, then 3 tabs daily x 4 days, then 2 tabs daily x 4 days, then 1 tab daily x4 days then stop. #40

## 2016-04-22 NOTE — Assessment & Plan Note (Signed)
Acute worsening Although no bronchospasm today-given her prior history we'll treat with steroids Also treated sequentially for postnasal drip and if no response then GERD   Take store brand Sudafed daily x 2-3 weeks -check your blood pressure while on this  Okay to take Delsym 5 ML every 6 hours for cough If no better in 3 days call us for codeine-containing cough syrup

## 2016-04-22 NOTE — Progress Notes (Signed)
   Subjective:    Patient ID: Melissa Elliott, female    DOB: 06-26-1941, 75 y.o.   MRN: [id number]  HPI  Follow-up for Moderate, Persistent Asthma w/ Mild Airways Obstruction, Right Lung Nodules, & Chronic Allergic Rhinitis  Chief Complaint  Patient presents with  . Follow-up    Pt states Coughing a lot, clear mucus, worst throughout day, drainage congestion, no tightness, most of time dry heaving cough    She has moderate persistent asthma and has been maintained on Advair. She reports a chronic cough for many years-worse seasonally in the fall and winter and resolves by spring. She had a recent visit to [place], grandkids were sick, since then she reports nasal drainage with postnasal drip followed by worsening acute cough-she has been unable to sleep for 2 nights, nonproductive, no diurnal variation.  Does not report heartburn. No fevers or wheezing  In the past, acute worsening of cough has responded to steroids. She is compliant with Advair and Allegra-prior allergy testing (RAST)has been negative I also reviewed CT scan showing stable pulmonary nodules  Past Medical History:  Diagnosis Date  . Asthma   . Complication of anesthesia    "takes a long time to wakeup" (02/01/2013)  . Coronary artery disease   . H/O hiatal hernia   . Heart murmur   . High cholesterol    "on RX because of the bypass" (02/01/2013)  . Hypertension   . Hypoglycemia    "that's something I have to watch constantly" (02/01/2013)  . Pneumonia ? 1970's   "once" (02/01/2013)  . Sciatic nerve pain 12/2012   "RLE" (02/01/2013)      Review of Systems neg for any significant sore throat, dysphagia, itching, sneezing, fever, chills, sweats, unintended wt loss, pleuritic or exertional cp, hempoptysis, orthopnea pnd or change in chronic leg swelling.   Also denies presyncope, palpitations, heartburn, abdominal pain, nausea, vomiting, diarrhea or change in bowel or urinary habits, dysuria,hematuria, rash,  arthralgias, visual complaints, headache, numbness weakness or ataxia.     Objective:   Physical Exam   Gen. Pleasant, well-nourished, in no distress, Anxious affect ENT - no lesions, no post nasal drip Neck: No JVD, no thyromegaly, no carotid bruits Lungs: no use of accessory muscles, no dullness to percussion, clear without rales or rhonchi  Cardiovascular: Rhythm regular, heart sounds  normal, no murmurs or gallops, no peripheral edema Abdomen: soft and non-tender, no hepatosplenomegaly, BS normal. Musculoskeletal: No deformities, no cyanosis or clubbing Neuro:  alert, non focal        Assessment & Plan:

## 2016-04-22 NOTE — Patient Instructions (Signed)
Medrol 80 mg IM  Prednisone 10 mg tabs  Take 4 tabs  daily with food x 4 days, then 3 tabs daily x 4 days, then 2 tabs daily x 4 days, then 1 tab daily x4 days then stop. #40  Take store brand Sudafed daily x 2-3 weeks -check your blood pressure while on this  Okay to take Delsym 5 ML every 6 hours for cough If no better in 3 days call us for codeine-containing cough syrup

## 2016-05-21 ENCOUNTER — Other Ambulatory Visit: Payer: Self-pay | Admitting: Internal Medicine

## 2016-05-21 DIAGNOSIS — Z1231 Encounter for screening mammogram for malignant neoplasm of breast: Secondary | ICD-10-CM

## 2016-05-22 ENCOUNTER — Ambulatory Visit
Admission: RE | Admit: 2016-05-22 | Discharge: 2016-05-22 | Disposition: A | Discharge status: Home / Self Care | Payer: Commercial Managed Care - HMO | Source: Ambulatory Visit | Attending: Internal Medicine | Admitting: Internal Medicine

## 2016-05-22 DIAGNOSIS — Z1231 Encounter for screening mammogram for malignant neoplasm of breast: Secondary | ICD-10-CM

## 2016-05-28 ENCOUNTER — Ambulatory Visit: Payer: Commercial Managed Care - HMO | Admitting: Pulmonary Disease

## 2016-10-20 DIAGNOSIS — L720 Epidermal cyst: Secondary | ICD-10-CM | POA: Diagnosis not present

## 2016-10-20 DIAGNOSIS — Z86018 Personal history of other benign neoplasm: Secondary | ICD-10-CM | POA: Diagnosis not present

## 2016-11-12 DIAGNOSIS — H353131 Nonexudative age-related macular degeneration, bilateral, early dry stage: Secondary | ICD-10-CM | POA: Diagnosis not present

## 2016-11-12 DIAGNOSIS — H2513 Age-related nuclear cataract, bilateral: Secondary | ICD-10-CM | POA: Diagnosis not present

## 2016-12-06 DIAGNOSIS — Z0100 Encounter for examination of eyes and vision without abnormal findings: Secondary | ICD-10-CM | POA: Diagnosis not present

## 2017-04-16 ENCOUNTER — Other Ambulatory Visit: Payer: Self-pay | Admitting: Internal Medicine

## 2017-04-16 DIAGNOSIS — Z1231 Encounter for screening mammogram for malignant neoplasm of breast: Secondary | ICD-10-CM

## 2017-04-26 NOTE — Progress Notes (Signed)
Cardiology Office Note   Date:  04/27/2017   ID:  Rosenhayn JON, DOB 25-Oct-1940, MRN [id number]  PCP:  Wenda Low, MD    No chief complaint on file. CAD   Wt Readings from Last 3 Encounters:  04/27/17 133 lb 6.4 oz (60.5 kg)  04/22/16 132 lb 3.2 oz (60 kg)  04/18/16 133 lb (60.3 kg)       History of Present Illness: Melissa Elliott is a 76 y.o. female  with CAD. CABG in 1996.    She had several stomach ulcers several years ago; likely secondary to meloxicam. Not taking NSAIDs now.  She felt a littlea little throat tightness in Jan 2017. We performed a stress test. This showed a questionable inferior wall defect.  Sx resolved with asthma treatment.  Her asthma is much better controlled with Singulair.  Cath was not done.  Sx were managed medically.    BP has been high.  Denies : Chest pain. Dizziness. Leg edema. Nitroglycerin use. Orthopnea. Palpitations. Paroxysmal nocturnal dyspnea. Shortness of breath. Syncope.   She continues to walk and not have any problems.    She walked a 5K in April.  No problems.       Past Medical History:  Diagnosis Date  . Asthma   . Complication of anesthesia    "takes a long time to wakeup" (02/01/2013)  . Coronary artery disease   . H/O hiatal hernia   . Heart murmur   . High cholesterol    "on RX because of the bypass" (02/01/2013)  . Hypertension   . Hypoglycemia    "that's something I have to watch constantly" (02/01/2013)  . Pneumonia ? 1970's   "once" (02/01/2013)  . Sciatic nerve pain 12/2012   "RLE" (02/01/2013)    Past Surgical History:  Procedure Laterality Date  . APPENDECTOMY  1970  . BREAST LUMPECTOMY Left ~ 1975  . COLONOSCOPY WITH PROPOFOL N/A 06/19/2015   Procedure: COLONOSCOPY WITH PROPOFOL;  Surgeon: Garlan Fair, MD;  Location: [facility] ENDOSCOPY;  Service: Endoscopy;  Laterality: N/A;  . CORONARY ARTERY BYPASS GRAFT  ~ 1997   CABG X2  . ESOPHAGOGASTRODUODENOSCOPY N/A 02/02/2013   Procedure: ESOPHAGOGASTRODUODENOSCOPY (EGD);  Surgeon: Wonda Horner, MD;  Location: [facility] ENDOSCOPY;  Service: Endoscopy;  Laterality: N/A;  . SKIN LESION EXCISION Left 1960's   "upper arm; sore that never healed" (02/01/2013)  . TONSILLECTOMY  1940's     Current Outpatient Prescriptions  Medication Sig Dispense Refill  . ADVAIR DISKUS 250-50 MCG/DOSE AEPB Inhale 1 puff into the lungs as needed. Only uses in winter and spring months  11  . albuterol (PROVENTIL HFA;VENTOLIN HFA) 108 (90 Base) MCG/ACT inhaler Inhale 2 puffs into the lungs every 6 (six) hours as needed for wheezing. 1 Inhaler 3  . aspirin (ASPIRIN EC) 81 MG EC tablet Take 81 mg by mouth daily. Swallow whole.    . calcium carbonate (OS-CAL) 600 MG tablet Take 600 mg by mouth daily.    . Cholecalciferol (VITAMIN D) 2000 UNITS CAPS Take 2,000 Units by mouth daily.    . fexofenadine (ALLEGRA) 30 MG tablet Take 30 mg by mouth daily as needed.    . irbesartan (AVAPRO) 150 MG tablet Take 150 mg by mouth daily.  1  . metoprolol succinate (TOPROL-XL) 50 MG 24 hr tablet Take 50 mg by mouth at bedtime.     . montelukast (SINGULAIR) 10 MG tablet Take 1 tablet (10 mg total) by mouth at bedtime.  90 tablet 3  . nitroGLYCERIN (NITROSTAT) 0.4 MG SL tablet Place 0.4 mg under the tongue every 5 (five) minutes as needed for chest pain (UP TO 3 DOSES BEFORE CALLING EMS).    . simvastatin (ZOCOR) 40 MG tablet Take 40 mg by mouth at bedtime.      No current facility-administered medications for this visit.     Allergies:   Ciprofloxacin; Sulfa antibiotics; Iodine; Eggs or egg-derived products; and Penicillins    Social History:  The patient  reports that she has never smoked. She has never used smokeless tobacco. She reports that she drinks alcohol. She reports that she does not use drugs.   Family History:  The patient's family history includes Asthma in her mother; Breast cancer in her paternal aunt; CAD in her mother; Heart attack in her father;  Hypertension in her father, maternal grandfather, maternal grandmother, and mother.    ROS:  Please see the history of present illness.   Otherwise, review of systems are positive for increased BP.   All other systems are reviewed and negative.    PHYSICAL EXAM: VS:  BP (!) 166/88 (BP Location: Right Arm, Patient Position: Sitting, Cuff Size: Normal)   Pulse 76   Ht 5\' 4"  (1.626 m)   Wt 133 lb 6.4 oz (60.5 kg)   SpO2 95%   BMI 22.90 kg/m  , BMI Body mass index is 22.9 kg/m. GEN: Well nourished, well developed, in no acute distress  HEENT: normal  Neck: no JVD, carotid bruits, or masses Cardiac: RRR; no murmurs, rubs, or gallops,no edema  Respiratory:  clear to auscultation bilaterally, normal work of breathing GI: soft, nontender, nondistended, + BS MS: no deformity or atrophy  Skin: warm and dry, no rash Neuro:  Strength and sensation are intact Psych: euthymic mood, full affect   EKG:   The ekg ordered today demonstrates NSR, IRBBB, no ST segment changes   Recent Labs: No results found for requested labs within last 8760 hours.   Lipid Panel No results found for: CHOL, TRIG, HDL, CHOLHDL, VLDL, LDLCALC, LDLDIRECT   Other studies Reviewed: Additional studies/ records that were reviewed today with results demonstrating: prior stress test reviewed.   ASSESSMENT AND PLAN:  1. CAD/ mildly abnormal stress test:  No angina on current medical therapy.  COntinue aggressive secondary prevention.   2. Hyperlipidemia: LDL 76 in 2016.  She has a physical in a few weeks.  Blood to be checked at that time.   3. HTN: BP high.  Increase irbesartan to 300 mg.  If BP stays high, would add amlodipine.  Valsartan was stopped due to recall.  BMet to be checked with PMD at physical in a few weeks.  She will call if her BP stays elevated.    Current medicines are reviewed at length with the patient today.  The patient concerns regarding her medicines were addressed.  The following changes  have been made:  Increase irbesartan  Labs/ tests ordered today include:  No orders of the defined types were placed in this encounter.   Recommend 150 minutes/week of aerobic exercise Low fat, low carb, high fiber diet recommended  Disposition:   FU in 1 year   Signed, Larae Grooms, MD  04/27/2017 12:11 PM    [facility] [place], [place], [place]  [address] Phone: [phone]; Fax: [phone]

## 2017-04-27 ENCOUNTER — Encounter: Payer: Self-pay | Admitting: Interventional Cardiology

## 2017-04-27 ENCOUNTER — Ambulatory Visit (INDEPENDENT_AMBULATORY_CARE_PROVIDER_SITE_OTHER): Payer: Medicare HMO | Admitting: Interventional Cardiology

## 2017-04-27 VITALS — BP 166/88 | HR 76 | Ht 64.0 in | Wt 133.4 lb

## 2017-04-27 DIAGNOSIS — I10 Essential (primary) hypertension: Secondary | ICD-10-CM

## 2017-04-27 DIAGNOSIS — I25119 Atherosclerotic heart disease of native coronary artery with unspecified angina pectoris: Secondary | ICD-10-CM

## 2017-04-27 DIAGNOSIS — I25118 Atherosclerotic heart disease of native coronary artery with other forms of angina pectoris: Secondary | ICD-10-CM

## 2017-04-27 DIAGNOSIS — Z951 Presence of aortocoronary bypass graft: Secondary | ICD-10-CM | POA: Diagnosis not present

## 2017-04-27 DIAGNOSIS — E782 Mixed hyperlipidemia: Secondary | ICD-10-CM | POA: Diagnosis not present

## 2017-04-27 MED ORDER — IRBESARTAN 300 MG PO TABS: 300.0000 mg | ORAL_TABLET | Freq: Every day | ORAL | 3 refills | Status: DC

## 2017-04-27 NOTE — Patient Instructions (Signed)
Medication Instructions:  Your physician has recommended you make the following change in your medication:  INCREASE irbesartan to 300 mg daily    Labwork: None ordered  Testing/Procedures: None ordered  Follow-Up: Your physician wants you to follow-up in: 1 year with Dr. Irish Lack. You will receive a reminder letter in the mail two months in advance. If you don't receive a letter, please call our office to schedule the follow-up appointment.   Any Other Special Instructions Will Be Listed Below (If Applicable).     If you need a refill on your cardiac medications before your next appointment, please call your pharmacy.

## 2017-05-20 DIAGNOSIS — Z1389 Encounter for screening for other disorder: Secondary | ICD-10-CM | POA: Diagnosis not present

## 2017-05-20 DIAGNOSIS — R7303 Prediabetes: Secondary | ICD-10-CM | POA: Diagnosis not present

## 2017-05-20 DIAGNOSIS — I10 Essential (primary) hypertension: Secondary | ICD-10-CM | POA: Diagnosis not present

## 2017-05-20 DIAGNOSIS — J4530 Mild persistent asthma, uncomplicated: Secondary | ICD-10-CM | POA: Diagnosis not present

## 2017-05-20 DIAGNOSIS — N183 Chronic kidney disease, stage 3 (moderate): Secondary | ICD-10-CM | POA: Diagnosis not present

## 2017-05-20 DIAGNOSIS — J309 Allergic rhinitis, unspecified: Secondary | ICD-10-CM | POA: Diagnosis not present

## 2017-05-20 DIAGNOSIS — E782 Mixed hyperlipidemia: Secondary | ICD-10-CM | POA: Diagnosis not present

## 2017-05-20 DIAGNOSIS — Z0000 Encounter for general adult medical examination without abnormal findings: Secondary | ICD-10-CM | POA: Diagnosis not present

## 2017-05-20 DIAGNOSIS — I2510 Atherosclerotic heart disease of native coronary artery without angina pectoris: Secondary | ICD-10-CM | POA: Diagnosis not present

## 2017-05-20 DIAGNOSIS — Z23 Encounter for immunization: Secondary | ICD-10-CM | POA: Diagnosis not present

## 2017-05-25 ENCOUNTER — Ambulatory Visit
Admission: RE | Admit: 2017-05-25 | Discharge: 2017-05-25 | Disposition: A | Discharge status: Home / Self Care | Payer: Medicare HMO | Source: Ambulatory Visit | Attending: Internal Medicine | Admitting: Internal Medicine

## 2017-05-25 DIAGNOSIS — Z1231 Encounter for screening mammogram for malignant neoplasm of breast: Secondary | ICD-10-CM

## 2017-06-25 DIAGNOSIS — J4530 Mild persistent asthma, uncomplicated: Secondary | ICD-10-CM | POA: Diagnosis not present

## 2017-06-25 DIAGNOSIS — J069 Acute upper respiratory infection, unspecified: Secondary | ICD-10-CM | POA: Diagnosis not present

## 2017-07-21 DIAGNOSIS — I10 Essential (primary) hypertension: Secondary | ICD-10-CM | POA: Diagnosis not present

## 2017-07-21 DIAGNOSIS — J45901 Unspecified asthma with (acute) exacerbation: Secondary | ICD-10-CM | POA: Diagnosis not present

## 2017-11-03 DIAGNOSIS — M71342 Other bursal cyst, left hand: Secondary | ICD-10-CM | POA: Diagnosis not present

## 2017-11-03 DIAGNOSIS — I781 Nevus, non-neoplastic: Secondary | ICD-10-CM | POA: Diagnosis not present

## 2017-11-03 DIAGNOSIS — Z86018 Personal history of other benign neoplasm: Secondary | ICD-10-CM | POA: Diagnosis not present

## 2017-11-03 DIAGNOSIS — L84 Corns and callosities: Secondary | ICD-10-CM | POA: Diagnosis not present

## 2017-11-03 DIAGNOSIS — L578 Other skin changes due to chronic exposure to nonionizing radiation: Secondary | ICD-10-CM | POA: Diagnosis not present

## 2017-11-25 DIAGNOSIS — I10 Essential (primary) hypertension: Secondary | ICD-10-CM | POA: Diagnosis not present

## 2017-11-25 DIAGNOSIS — I25810 Atherosclerosis of coronary artery bypass graft(s) without angina pectoris: Secondary | ICD-10-CM | POA: Diagnosis not present

## 2017-11-25 DIAGNOSIS — E782 Mixed hyperlipidemia: Secondary | ICD-10-CM | POA: Diagnosis not present

## 2017-11-25 DIAGNOSIS — R7303 Prediabetes: Secondary | ICD-10-CM | POA: Diagnosis not present

## 2017-11-25 DIAGNOSIS — J4530 Mild persistent asthma, uncomplicated: Secondary | ICD-10-CM | POA: Diagnosis not present

## 2017-11-25 DIAGNOSIS — N183 Chronic kidney disease, stage 3 (moderate): Secondary | ICD-10-CM | POA: Diagnosis not present

## 2017-12-02 DIAGNOSIS — H2513 Age-related nuclear cataract, bilateral: Secondary | ICD-10-CM | POA: Diagnosis not present

## 2017-12-02 DIAGNOSIS — H353131 Nonexudative age-related macular degeneration, bilateral, early dry stage: Secondary | ICD-10-CM | POA: Diagnosis not present

## 2018-05-03 ENCOUNTER — Other Ambulatory Visit: Payer: Self-pay | Admitting: Internal Medicine

## 2018-05-03 DIAGNOSIS — Z1231 Encounter for screening mammogram for malignant neoplasm of breast: Secondary | ICD-10-CM

## 2018-05-06 ENCOUNTER — Ambulatory Visit: Payer: Medicare HMO | Admitting: Interventional Cardiology

## 2018-05-06 ENCOUNTER — Encounter: Payer: Self-pay | Admitting: Interventional Cardiology

## 2018-05-06 VITALS — BP 130/68 | HR 80 | Ht 64.0 in | Wt 132.4 lb

## 2018-05-06 DIAGNOSIS — Z01812 Encounter for preprocedural laboratory examination: Secondary | ICD-10-CM | POA: Diagnosis not present

## 2018-05-06 DIAGNOSIS — E782 Mixed hyperlipidemia: Secondary | ICD-10-CM | POA: Diagnosis not present

## 2018-05-06 DIAGNOSIS — I10 Essential (primary) hypertension: Secondary | ICD-10-CM | POA: Diagnosis not present

## 2018-05-06 DIAGNOSIS — Z951 Presence of aortocoronary bypass graft: Secondary | ICD-10-CM

## 2018-05-06 DIAGNOSIS — I25119 Atherosclerotic heart disease of native coronary artery with unspecified angina pectoris: Secondary | ICD-10-CM

## 2018-05-06 LAB — COMPREHENSIVE METABOLIC PANEL
ALT: 12 IU/L (ref 0–32)
AST: 16 IU/L (ref 0–40)
Albumin/Globulin Ratio: 1.8 (ref 1.2–2.2)
Albumin: 4.3 g/dL (ref 3.5–4.8)
Alkaline Phosphatase: 95 IU/L (ref 39–117)
BUN/Creatinine Ratio: 23 (ref 12–28)
BUN: 17 mg/dL (ref 8–27)
Bilirubin Total: 0.4 mg/dL (ref 0.0–1.2)
CALCIUM: 9.7 mg/dL (ref 8.7–10.3)
CO2: 24 mmol/L (ref 20–29)
CREATININE: 0.75 mg/dL (ref 0.57–1.00)
Chloride: 104 mmol/L (ref 96–106)
GFR calc Af Amer: 90 mL/min/{1.73_m2} (ref >59)
GFR, EST NON AFRICAN AMERICAN: 78 mL/min/{1.73_m2} (ref >59)
GLOBULIN, TOTAL: 2.4 g/dL (ref 1.5–4.5)
GLUCOSE: 92 mg/dL (ref 65–99)
Potassium: 5.1 mmol/L (ref 3.5–5.2)
SODIUM: 141 mmol/L (ref 134–144)
Total Protein: 6.7 g/dL (ref 6.0–8.5)

## 2018-05-06 LAB — CBC
HEMOGLOBIN: 13.3 g/dL (ref 11.1–15.9)
Hematocrit: 39.6 % (ref 34.0–46.6)
MCH: 28.7 pg (ref 26.6–33.0)
MCHC: 33.6 g/dL (ref 31.5–35.7)
MCV: 85 fL (ref 79–97)
PLATELETS: 303 10*3/uL (ref 150–450)
RBC: 4.64 x10E6/uL (ref 3.77–5.28)
RDW: 12.8 % (ref 12.3–15.4)
WBC: 10.6 10*3/uL (ref 3.4–10.8)

## 2018-05-06 MED ORDER — IRBESARTAN 300 MG PO TABS: 300.0000 mg | ORAL_TABLET | Freq: Every day | ORAL | 3 refills | Status: DC

## 2018-05-06 MED ORDER — PREDNISONE 50 MG PO TABS: ORAL_TABLET | ORAL | 0 refills | Status: DC

## 2018-05-06 NOTE — H&P (View-Only) (Signed)
Cardiology Office Note   Date:  05/06/2018   ID:  Melissa Elliott, DOB 1940/09/19, MRN [id number]  PCP:  Wenda Low, MD    No chief complaint on file.  CAD  Wt Readings from Last 3 Encounters:  05/06/18 132 lb 6.4 oz (60.1 kg)  04/27/17 133 lb 6.4 oz (60.5 kg)  04/22/16 132 lb 3.2 oz (60 kg)       History of Present Illness: Melissa Elliott is a 77 y.o. female  with CAD. CABG x 2 in 1996 @ [facility]; Dr. Roderick Pee..    She had several stomach ulcers several years ago; likely secondary to meloxicam. Not taking NSAIDs now.  She felt a littlea little throat tightness in Jan 2017. We performed a stress test. This showed a questionable inferior wall defect.  Sx resolved with asthma treatment.   Since the last visit, she has noticed some DOE.  She got out of the habit of regular exercise in 2019 ater watching her grandbaby in [place].  She did do a 5K in April 2019.    THis SHOB seems different than what she had when her asthma was not controlled.   These sx are similar to what she had in 1998.  No NTG use.     Past Medical History:  Diagnosis Date  . Asthma   . Complication of anesthesia    "takes a long time to wakeup" (02/01/2013)  . Coronary artery disease   . H/O hiatal hernia   . Heart murmur   . High cholesterol    "on RX because of the bypass" (02/01/2013)  . Hypertension   . Hypoglycemia    "that's something I have to watch constantly" (02/01/2013)  . Pneumonia ? 1970's   "once" (02/01/2013)  . Sciatic nerve pain 12/2012   "RLE" (02/01/2013)    Past Surgical History:  Procedure Laterality Date  . APPENDECTOMY  1970  . BREAST EXCISIONAL BIOPSY Left 1975   benign  . COLONOSCOPY WITH PROPOFOL N/A 06/19/2015   Procedure: COLONOSCOPY WITH PROPOFOL;  Surgeon: Garlan Fair, MD;  Location: [facility] ENDOSCOPY;  Service: Endoscopy;  Laterality: N/A;  . CORONARY ARTERY BYPASS GRAFT  ~ 1997   CABG X2  . ESOPHAGOGASTRODUODENOSCOPY  N/A 02/02/2013   Procedure: ESOPHAGOGASTRODUODENOSCOPY (EGD);  Surgeon: Wonda Horner, MD;  Location: [facility] ENDOSCOPY;  Service: Endoscopy;  Laterality: N/A;  . SKIN LESION EXCISION Left 1960's   "upper arm; sore that never healed" (02/01/2013)  . TONSILLECTOMY  1940's     Current Outpatient Medications  Medication Sig Dispense Refill  . ADVAIR DISKUS 250-50 MCG/DOSE AEPB Inhale 1 puff into the lungs as needed. Only uses in winter and spring months  11  . albuterol (PROVENTIL HFA;VENTOLIN HFA) 108 (90 Base) MCG/ACT inhaler Inhale 2 puffs into the lungs every 6 (six) hours as needed for wheezing. 1 Inhaler 3  . aspirin (ASPIRIN EC) 81 MG EC tablet Take 81 mg by mouth daily. Swallow whole.    . calcium carbonate (OS-CAL) 600 MG tablet Take 600 mg by mouth daily.    . Cholecalciferol (VITAMIN D) 2000 UNITS CAPS Take 2,000 Units by mouth daily.    . fexofenadine (ALLEGRA) 30 MG tablet Take 30 mg by mouth daily as needed.    . irbesartan (AVAPRO) 300 MG tablet Take 1 tablet (300 mg total) by mouth daily. 90 tablet 3  . metoprolol succinate (TOPROL-XL) 50 MG 24 hr tablet Take 50 mg by mouth at  bedtime.     . montelukast (SINGULAIR) 10 MG tablet Take 1 tablet (10 mg total) by mouth at bedtime. 90 tablet 3  . Multiple Vitamins-Minerals (ICAPS AREDS 2) CAPS Take 2 capsules by mouth daily.    . nitroGLYCERIN (NITROSTAT) 0.4 MG SL tablet Place 0.4 mg under the tongue every 5 (five) minutes as needed for chest pain (UP TO 3 DOSES BEFORE CALLING EMS).    . simvastatin (ZOCOR) 40 MG tablet Take 40 mg by mouth at bedtime.      No current facility-administered medications for this visit.     Allergies:   Ciprofloxacin; Sulfa antibiotics; Iodine; Eggs or egg-derived products; and Penicillins    Social History:  The patient  reports that she has never smoked. She has never used smokeless tobacco. She reports that she drinks alcohol. She reports that she does not use drugs.   Family History:  The patient's  family history includes Asthma in her mother; Breast cancer in her paternal aunt; CAD in her mother; Heart attack in her father; Hypertension in her father, maternal grandfather, maternal grandmother, and mother.    ROS:  Please see the history of present illness.   Otherwise, review of systems are positive for DOE.   All other systems are reviewed and negative.    PHYSICAL EXAM: VS:  BP 130/68   Pulse 80   Ht 5\' 4"  (1.626 m)   Wt 132 lb 6.4 oz (60.1 kg)   SpO2 97%   BMI 22.73 kg/m  , BMI Body mass index is 22.73 kg/m. GEN: Well nourished, well developed, in no acute distress  HEENT: normal  Neck: no JVD, carotid bruits, or masses Cardiac: RRR; no murmurs, rubs, or gallops,no edema  Respiratory:  clear to auscultation bilaterally, normal work of breathing GI: soft, nontender, nondistended, + BS MS: no deformity or atrophy  Skin: warm and dry, no rash Neuro:  Strength and sensation are intact Psych: euthymic mood, full affect   EKG:   The ekg ordered today demonstrates NSR, incomplete RBBB, no ST changes   Recent Labs: No results found for requested labs within last 8760 hours.   Lipid Panel No results found for: CHOL, TRIG, HDL, CHOLHDL, VLDL, LDLCALC, LDLDIRECT   Other studies Reviewed: Additional studies/ records that were reviewed today with results demonstrating: labs reviewed.   ASSESSMENT AND PLAN:  1. CAD: Sx concerning for angina. Plan for cardiac cath as they are bothersome.  Prior abnormal stress test that was medically managed.  2. Hyperlipidemia: LDL 89 at last check in 11/18.  Recheck when fasting 3. HTN: The current medical regimen is effective;  continue present plan and medications. Cardiac catheterization was discussed with the patient and husband fully. The patient understands that risks include but are not limited to stroke (1 in 1000), death (1 in 22), kidney failure [usually temporary] (1 in 500), bleeding (1 in 200), allergic reaction [possibly  serious] (1 in 200).  The patient understands and is willing to proceed.       Current medicines are reviewed at length with the patient today.  The patient concerns regarding her medicines were addressed.  The following changes have been made:  No change  Labs/ tests ordered today include:  No orders of the defined types were placed in this encounter.   Recommend 150 minutes/week of aerobic exercise Low fat, low carb, high fiber diet recommended  Disposition:   FU for cath   Signed, Larae Grooms, MD  05/06/2018 10:09 AM  [facility] [place], [place], [place]  [address] Phone: [phone]; Fax: [phone]

## 2018-05-06 NOTE — Progress Notes (Signed)
Cardiology Office Note   Date:  05/06/2018   ID:  Melissa Elliott, DOB 1941-06-21, MRN [id number]  PCP:  Wenda Low, MD    No chief complaint on file.  CAD  Wt Readings from Last 3 Encounters:  05/06/18 132 lb 6.4 oz (60.1 kg)  04/27/17 133 lb 6.4 oz (60.5 kg)  04/22/16 132 lb 3.2 oz (60 kg)       History of Present Illness: Melissa Elliott is a 77 y.o. female  with CAD. CABG x 2 in 1996 @ [facility]; Dr. Roderick Pee..    She had several stomach ulcers several years ago; likely secondary to meloxicam. Not taking NSAIDs now.  She felt a littlea little throat tightness in Jan 2017. We performed a stress test. This showed a questionable inferior wall defect.  Sx resolved with asthma treatment.   Since the last visit, she has noticed some DOE.  She got out of the habit of regular exercise in 2019 ater watching her grandbaby in [place].  She did do a 5K in April 2019.    THis SHOB seems different than what she had when her asthma was not controlled.   These sx are similar to what she had in 1998.  No NTG use.     Past Medical History:  Diagnosis Date  . Asthma   . Complication of anesthesia    "takes a long time to wakeup" (02/01/2013)  . Coronary artery disease   . H/O hiatal hernia   . Heart murmur   . High cholesterol    "on RX because of the bypass" (02/01/2013)  . Hypertension   . Hypoglycemia    "that's something I have to watch constantly" (02/01/2013)  . Pneumonia ? 1970's   "once" (02/01/2013)  . Sciatic nerve pain 12/2012   "RLE" (02/01/2013)    Past Surgical History:  Procedure Laterality Date  . APPENDECTOMY  1970  . BREAST EXCISIONAL BIOPSY Left 1975   benign  . COLONOSCOPY WITH PROPOFOL N/A 06/19/2015   Procedure: COLONOSCOPY WITH PROPOFOL;  Surgeon: Garlan Fair, MD;  Location: [facility] ENDOSCOPY;  Service: Endoscopy;  Laterality: N/A;  . CORONARY ARTERY BYPASS GRAFT  ~ 1997   CABG X2  . ESOPHAGOGASTRODUODENOSCOPY  N/A 02/02/2013   Procedure: ESOPHAGOGASTRODUODENOSCOPY (EGD);  Surgeon: Wonda Horner, MD;  Location: [facility] ENDOSCOPY;  Service: Endoscopy;  Laterality: N/A;  . SKIN LESION EXCISION Left 1960's   "upper arm; sore that never healed" (02/01/2013)  . TONSILLECTOMY  1940's     Current Outpatient Medications  Medication Sig Dispense Refill  . ADVAIR DISKUS 250-50 MCG/DOSE AEPB Inhale 1 puff into the lungs as needed. Only uses in winter and spring months  11  . albuterol (PROVENTIL HFA;VENTOLIN HFA) 108 (90 Base) MCG/ACT inhaler Inhale 2 puffs into the lungs every 6 (six) hours as needed for wheezing. 1 Inhaler 3  . aspirin (ASPIRIN EC) 81 MG EC tablet Take 81 mg by mouth daily. Swallow whole.    . calcium carbonate (OS-CAL) 600 MG tablet Take 600 mg by mouth daily.    . Cholecalciferol (VITAMIN D) 2000 UNITS CAPS Take 2,000 Units by mouth daily.    . fexofenadine (ALLEGRA) 30 MG tablet Take 30 mg by mouth daily as needed.    . irbesartan (AVAPRO) 300 MG tablet Take 1 tablet (300 mg total) by mouth daily. 90 tablet 3  . metoprolol succinate (TOPROL-XL) 50 MG 24 hr tablet Take 50 mg by mouth at  bedtime.     . montelukast (SINGULAIR) 10 MG tablet Take 1 tablet (10 mg total) by mouth at bedtime. 90 tablet 3  . Multiple Vitamins-Minerals (ICAPS AREDS 2) CAPS Take 2 capsules by mouth daily.    . nitroGLYCERIN (NITROSTAT) 0.4 MG SL tablet Place 0.4 mg under the tongue every 5 (five) minutes as needed for chest pain (UP TO 3 DOSES BEFORE CALLING EMS).    . simvastatin (ZOCOR) 40 MG tablet Take 40 mg by mouth at bedtime.      No current facility-administered medications for this visit.     Allergies:   Ciprofloxacin; Sulfa antibiotics; Iodine; Eggs or egg-derived products; and Penicillins    Social History:  The patient  reports that she has never smoked. She has never used smokeless tobacco. She reports that she drinks alcohol. She reports that she does not use drugs.   Family History:  The patient's  family history includes Asthma in her mother; Breast cancer in her paternal aunt; CAD in her mother; Heart attack in her father; Hypertension in her father, maternal grandfather, maternal grandmother, and mother.    ROS:  Please see the history of present illness.   Otherwise, review of systems are positive for DOE.   All other systems are reviewed and negative.    PHYSICAL EXAM: VS:  BP 130/68   Pulse 80   Ht 5\' 4"  (1.626 m)   Wt 132 lb 6.4 oz (60.1 kg)   SpO2 97%   BMI 22.73 kg/m  , BMI Body mass index is 22.73 kg/m. GEN: Well nourished, well developed, in no acute distress  HEENT: normal  Neck: no JVD, carotid bruits, or masses Cardiac: RRR; no murmurs, rubs, or gallops,no edema  Respiratory:  clear to auscultation bilaterally, normal work of breathing GI: soft, nontender, nondistended, + BS MS: no deformity or atrophy  Skin: warm and dry, no rash Neuro:  Strength and sensation are intact Psych: euthymic mood, full affect   EKG:   The ekg ordered today demonstrates NSR, incomplete RBBB, no ST changes   Recent Labs: No results found for requested labs within last 8760 hours.   Lipid Panel No results found for: CHOL, TRIG, HDL, CHOLHDL, VLDL, LDLCALC, LDLDIRECT   Other studies Reviewed: Additional studies/ records that were reviewed today with results demonstrating: labs reviewed.   ASSESSMENT AND PLAN:  1. CAD: Sx concerning for angina. Plan for cardiac cath as they are bothersome.  Prior abnormal stress test that was medically managed.  2. Hyperlipidemia: LDL 89 at last check in 11/18.  Recheck when fasting 3. HTN: The current medical regimen is effective;  continue present plan and medications. Cardiac catheterization was discussed with the patient and husband fully. The patient understands that risks include but are not limited to stroke (1 in 1000), death (1 in 29), kidney failure [usually temporary] (1 in 500), bleeding (1 in 200), allergic reaction [possibly  serious] (1 in 200).  The patient understands and is willing to proceed.       Current medicines are reviewed at length with the patient today.  The patient concerns regarding her medicines were addressed.  The following changes have been made:  No change  Labs/ tests ordered today include:  No orders of the defined types were placed in this encounter.   Recommend 150 minutes/week of aerobic exercise Low fat, low carb, high fiber diet recommended  Disposition:   FU for cath   Signed, Larae Grooms, MD  05/06/2018 10:09 AM  [facility] [place], [place], [place]  [address] Phone: [phone]; Fax: [phone]

## 2018-05-06 NOTE — Patient Instructions (Signed)
Medication Instructions:  Your physician recommends that you continue on your current medications as directed. Please refer to the Current Medication list given to you today.  If you need a refill on your cardiac medications before your next appointment, please call your pharmacy.   Lab work: TODAY: CMET, CBC  If you have labs (blood work) drawn today and your tests are completely normal, you will receive your results only by: Marland Kitchen MyChart Message (if you have MyChart) OR . A paper copy in the mail If you have any lab test that is abnormal or we need to change your treatment, we will call you to review the results.  Testing/Procedures: Your physician has requested that you have a cardiac catheterization. Cardiac catheterization is used to diagnose and/or treat various heart conditions. Doctors may recommend this procedure for a number of different reasons. The most common reason is to evaluate chest pain. Chest pain can be a symptom of coronary artery disease (CAD), and cardiac catheterization can show whether plaque is narrowing or blocking your heart's arteries. This procedure is also used to evaluate the valves, as well as measure the blood flow and oxygen levels in different parts of your heart. For further information please visit [email or web address]. Please follow instruction sheet, as given.  Follow-Up: At [facility], you and your health needs are our priority.  As part of our continuing mission to provide you with exceptional heart care, we have created designated Provider Care Teams.  These Care Teams include your primary Cardiologist (physician) and Advanced Practice Providers (APPs -  Physician Assistants and Nurse Practitioners) who all work together to provide you with the care you need, when you need it. . You will need a follow up appointment 2-3 weeks after heart catheterization. You may see Casandra Doffing, MD or one of the following Advanced Practice Providers on your designated  Care Team:   . Lyda Jester, PA-C . Dayna Dunn, PA-C . Ermalinda Barrios, PA-C  Any Other Special Instructions Will Be Listed Below (If Applicable).     [place] OFFICE [place], [place] [address] Dept: [phone] Loc: [phone]  Melissa Elliott  05/06/2018  You are scheduled for a Cardiac Catheterization on Tuesday, October 29 with Dr. Larae Grooms.  1. Please arrive at the [facility] (Main Entrance A) at [facility]: [address] [place], [place] [address] at 11:30 AM (This time is two hours before your procedure to ensure your preparation). Free valet parking service is available.   Special note: Every effort is made to have your procedure done on time. Please understand that emergencies sometimes delay scheduled procedures.  2. Diet: Do not eat solid foods after midnight.  The patient may have clear liquids until 5am upon the day of the procedure.  3. Labs: TODAY 4. Medication instructions in preparation for your procedure:   Contrast Allergy: Iodine Yes,  Please take Prednisone 50mg  by mouth at: Thirteen hours prior to cath Seven hours prior to cath And prior to leaving home please take last dose of Prednisone 50mg  and Benadryl 50mg  by mouth.  On the morning of your procedure, take your Aspirin and any morning medicines NOT listed above.  You may use sips of water.  5. Plan for one night stay--bring personal belongings. 6. Bring a current list of your medications and current insurance cards. 7. You MUST have a responsible person to drive you home. 8. Someone MUST  be with you the first 24 hours after you arrive home or your discharge will be delayed. 9. Please wear clothes that are easy to get on and off and wear slip-on shoes.  Thank you for allowing [place] to care for you!   -- [facility]   Coronary Angiogram  With Stent Coronary angiogram with stent placement is a procedure to widen or open a narrow blood vessel of the heart (coronary artery). Arteries may become blocked by cholesterol buildup (plaques) in the lining of the wall. When a coronary artery becomes partially blocked, blood flow to that area decreases. This may lead to chest pain or a heart attack (myocardial infarction). A stent is a small piece of metal that looks like mesh or a spring. Stent placement may be done as treatment for a heart attack or right after a coronary angiogram in which a blocked artery is found. Let your health care provider know about:  Any allergies you have.  All medicines you are taking, including vitamins, herbs, eye drops, creams, and over-the-counter medicines.  Any problems you or family members have had with anesthetic medicines.  Any blood disorders you have.  Any surgeries you have had.  Any medical conditions you have.  Whether you are pregnant or may be pregnant. What are the risks? Generally, this is a safe procedure. However, problems may occur, including:  Damage to the heart or its blood vessels.  A return of blockage.  Bleeding, infection, or bruising at the insertion site.  A collection of blood under the skin (hematoma) at the insertion site.  A blood clot in another part of the body.  Kidney injury.  Allergic reaction to the dye or contrast that is used.  Bleeding into the abdomen (retroperitoneal bleeding).  What happens before the procedure? Staying hydrated Follow instructions from your health care provider about hydration, which may include:  Up to 2 hours before the procedure - you may continue to drink clear liquids, such as water, clear fruit juice, black coffee, and plain tea.  Eating and drinking restrictions Follow instructions from your health care provider about eating and drinking, which may include:  8 hours before the procedure - stop eating heavy meals or  foods such as meat, fried foods, or fatty foods.  6 hours before the procedure - stop eating light meals or foods, such as toast or cereal.  2 hours before the procedure - stop drinking clear liquids.  Ask your health care provider about:  Changing or stopping your regular medicines. This is especially important if you are taking diabetes medicines or blood thinners.  Taking medicines such as ibuprofen. These medicines can thin your blood. Do not take these medicines before your procedure if your health care provider instructs you not to. Generally, aspirin is recommended before a procedure of passing a small, thin tube (catheter) through a blood vessel and into the heart (cardiac catheterization).  What happens during the procedure?  An IV tube will be inserted into one of your veins.  You will be given one or more of the following: ? A medicine to help you relax (sedative). ? A medicine to numb the area where the catheter will be inserted into an artery (local anesthetic).  To reduce your risk of infection: ? Your health care team will wash or sanitize their hands. ? Your skin will be washed with soap. ? Hair may be removed from the area where the catheter will be inserted.  Using a  guide wire, the catheter will be inserted into an artery. The location may be in your groin, in your wrist, or in the fold of your arm (near your elbow).  A type of X-ray (fluoroscopy) will be used to help guide the catheter to the opening of the arteries in the heart.  A dye will be injected into the catheter, and X-rays will be taken. The dye will help to show where any narrowing or blockages are located in the arteries.  A tiny wire will be guided to the blocked spot, and a balloon will be inflated to make the artery wider.  The stent will be expanded and will crush the plaques into the wall of the vessel. The stent will hold the area open and improve the blood flow. Most stents have a drug coating  to reduce the risk of the stent narrowing over time.  The artery may be made wider using a drill, laser, or other tools to remove plaques.  When the blood flow is better, the catheter will be removed. The lining of the artery will grow over the stent, which stays where it was placed. This procedure may vary among health care providers and hospitals. What happens after the procedure?  If the procedure is done through the leg, you will be kept in bed lying flat for about 6 hours. You will be instructed to not bend and not cross your legs.  The insertion site will be checked frequently.  The pulse in your foot or wrist will be checked frequently.  You may have additional blood tests, X-rays, and a test that records the electrical activity of your heart (electrocardiogram, or ECG). This information is not intended to replace advice given to you by your health care provider. Make sure you discuss any questions you have with your health care provider. Document Released: 01/04/2003 Document Revised: 02/28/2016 Document Reviewed: 02/03/2016 Elsevier Interactive Patient Education  2018 [employer].  Coronary Angiogram A coronary angiogram is an X-ray procedure that is used to examine the arteries in the heart. In this procedure, a dye (contrast dye) is injected through a long, thin tube (catheter). The catheter is inserted through the groin, wrist, or arm. The dye is injected into each artery, then X-rays are taken to show if there is a blockage in the arteries of the heart. This procedure can also show if you have valve disease or a disease of the aorta, and it can be used to check the overall function of your heart muscle. You may have a coronary angiogram if:  You are having chest pain, or other symptoms of angina, and you are at risk for heart disease.  You have an abnormal electrocardiogram (ECG) or stress test.  You have chest pain and heart failure.  You are having irregular heart  rhythms.  You and your health care provider determine that the benefits of the test information outweigh the risks of the procedure.  Let your health care provider know about:  Any allergies you have, including allergies to contrast dye.  All medicines you are taking, including vitamins, herbs, eye drops, creams, and over-the-counter medicines.  Any problems you or family members have had with anesthetic medicines.  Any blood disorders you have.  Any surgeries you have had.  History of kidney problems or kidney failure.  Any medical conditions you have.  Whether you are pregnant or may be pregnant. What are the risks? Generally, this is a safe procedure. However, problems may occur, including:  Infection.  Allergic reaction to medicines or dyes that are used.  Bleeding from the access site or other locations.  Kidney injury, especially in people with impaired kidney function.  Stroke (rare).  Heart attack (rare).  Damage to other structures or organs.  What happens before the procedure? Staying hydrated Follow instructions from your health care provider about hydration, which may include:  Up to 2 hours before the procedure - you may continue to drink clear liquids, such as water, clear fruit juice, black coffee, and plain tea.  Eating and drinking restrictions Follow instructions from your health care provider about eating and drinking, which may include:  8 hours before the procedure - stop eating heavy meals or foods such as meat, fried foods, or fatty foods.  6 hours before the procedure - stop eating light meals or foods, such as toast or cereal.  2 hours before the procedure - stop drinking clear liquids.  General instructions  Ask your health care provider about: ? Changing or stopping your regular medicines. This is especially important if you are taking diabetes medicines or blood thinners. ? Taking medicines such as ibuprofen. These medicines can  thin your blood. Do not take these medicines before your procedure if your health care provider instructs you not to, though aspirin may be recommended prior to coronary angiograms.  Plan to have someone take you home from the hospital or clinic.  You may need to have blood tests or X-rays done. What happens during the procedure?  An IV tube will be inserted into one of your veins.  You will be given one or more of the following: ? A medicine to help you relax (sedative). ? A medicine to numb the area where the catheter will be inserted into an artery (local anesthetic).  To reduce your risk of infection: ? Your health care team will wash or sanitize their hands. ? Your skin will be washed with soap. ? Hair may be removed from the area where the catheter will be inserted.  You will be connected to a continuous ECG monitor.  The catheter will be inserted into an artery. The location may be in your groin, in your wrist, or in the fold of your arm (near your elbow).  A type of X-ray (fluoroscopy) will be used to help guide the catheter to the opening of the blood vessel that is being examined.  A dye will be injected into the catheter, and X-rays will be taken. The dye will help to show where any narrowing or blockages are located in the heart arteries.  Tell your health care provider if you have any chest pain or trouble breathing during the procedure.  If blockages are found, your health care provider may perform another procedure, such as inserting a coronary stent. The procedure may vary among health care providers and hospitals. What happens after the procedure?  After the procedure, you will need to keep the area still for a few hours, or for as long as told by your health care provider. If the procedure is done through the groin, you will be instructed to not bend and not cross your legs.  The insertion site will be checked frequently.  The pulse in your foot or wrist will be  checked frequently.  You may have additional blood tests, X-rays, and a test that records the electrical activity of your heart (ECG).  Do not drive for 24 hours if you were given a sedative. Summary  A coronary angiogram  is an X-ray procedure that is used to look into the arteries in the heart.  During the procedure, a dye (contrast dye) is injected through a long, thin tube (catheter). The catheter is inserted through the groin, wrist, or arm.  Tell your health care provider about any allergies you have, including allergies to contrast dye.  After the procedure, you will need to keep the area still for a few hours, or for as long as told by your health care provider. This information is not intended to replace advice given to you by your health care provider. Make sure you discuss any questions you have with your health care provider. Document Released: 01/04/2003 Document Revised: 04/11/2016 Document Reviewed: 04/11/2016 Elsevier Interactive Patient Education  [employer].

## 2018-05-10 ENCOUNTER — Telehealth: Payer: Self-pay | Admitting: *Deleted

## 2018-05-10 NOTE — Telephone Encounter (Signed)
Pt contacted pre-catheterization scheduled at [facility] for: Tuesday May 11, 2018 1:30 PM Verified arrival time and place: [place] Entrance A at: 11:30 AM  No solid food after midnight prior to cath, clear liquids until 5 AM day of procedure.  Contrast allergy: yes-reviewed instructions for 13 hours Prednisone and Benadryl Prep with patient. Prednisone 50 mg 12:30 AM 05/11/18 Prednisone 50 mg 6:30 AM 05/11/18 Prednisone 50 mg and Benadryl 50 mg prior to leaving for hospital AM of procedure.  Verified no diabetes medications: yes  AM meds can be  taken pre-cath with sip of water including: ASA 81 mg. Prednisone 50 mg Benadryl 50 mg  Confirmed patient has responsible person to drive home post procedure and for 24 hours after you arrive home: yes

## 2018-05-11 ENCOUNTER — Encounter (HOSPITAL_COMMUNITY): Payer: Self-pay | Admitting: General Practice

## 2018-05-11 ENCOUNTER — Ambulatory Visit (HOSPITAL_COMMUNITY)
Admission: RE | Admit: 2018-05-11 | Discharge: 2018-05-12 | Disposition: A | Discharge status: Home / Self Care | Payer: Medicare HMO | Source: Ambulatory Visit | Attending: Interventional Cardiology | Admitting: Interventional Cardiology

## 2018-05-11 ENCOUNTER — Other Ambulatory Visit: Payer: Self-pay

## 2018-05-11 ENCOUNTER — Encounter (HOSPITAL_COMMUNITY)
Admission: RE | Disposition: A | Discharge status: Home / Self Care | Payer: Self-pay | Source: Ambulatory Visit | Attending: Interventional Cardiology

## 2018-05-11 DIAGNOSIS — Z888 Allergy status to other drugs, medicaments and biological substances status: Secondary | ICD-10-CM | POA: Diagnosis not present

## 2018-05-11 DIAGNOSIS — Z951 Presence of aortocoronary bypass graft: Secondary | ICD-10-CM | POA: Insufficient documentation

## 2018-05-11 DIAGNOSIS — I10 Essential (primary) hypertension: Secondary | ICD-10-CM | POA: Diagnosis present

## 2018-05-11 DIAGNOSIS — E785 Hyperlipidemia, unspecified: Secondary | ICD-10-CM | POA: Diagnosis not present

## 2018-05-11 DIAGNOSIS — I25119 Atherosclerotic heart disease of native coronary artery with unspecified angina pectoris: Secondary | ICD-10-CM

## 2018-05-11 DIAGNOSIS — I209 Angina pectoris, unspecified: Secondary | ICD-10-CM | POA: Diagnosis present

## 2018-05-11 DIAGNOSIS — Z8719 Personal history of other diseases of the digestive system: Secondary | ICD-10-CM | POA: Insufficient documentation

## 2018-05-11 DIAGNOSIS — Z7982 Long term (current) use of aspirin: Secondary | ICD-10-CM | POA: Insufficient documentation

## 2018-05-11 DIAGNOSIS — Z882 Allergy status to sulfonamides status: Secondary | ICD-10-CM | POA: Diagnosis not present

## 2018-05-11 DIAGNOSIS — I25118 Atherosclerotic heart disease of native coronary artery with other forms of angina pectoris: Secondary | ICD-10-CM | POA: Diagnosis not present

## 2018-05-11 DIAGNOSIS — J45909 Unspecified asthma, uncomplicated: Secondary | ICD-10-CM | POA: Diagnosis not present

## 2018-05-11 DIAGNOSIS — Z79899 Other long term (current) drug therapy: Secondary | ICD-10-CM | POA: Diagnosis not present

## 2018-05-11 DIAGNOSIS — Z9889 Other specified postprocedural states: Secondary | ICD-10-CM | POA: Diagnosis not present

## 2018-05-11 DIAGNOSIS — Z91012 Allergy to eggs: Secondary | ICD-10-CM | POA: Diagnosis not present

## 2018-05-11 DIAGNOSIS — Z881 Allergy status to other antibiotic agents status: Secondary | ICD-10-CM | POA: Insufficient documentation

## 2018-05-11 DIAGNOSIS — Z8249 Family history of ischemic heart disease and other diseases of the circulatory system: Secondary | ICD-10-CM | POA: Diagnosis not present

## 2018-05-11 DIAGNOSIS — Z955 Presence of coronary angioplasty implant and graft: Secondary | ICD-10-CM

## 2018-05-11 DIAGNOSIS — Z880 Allergy status to penicillin: Secondary | ICD-10-CM | POA: Diagnosis not present

## 2018-05-11 DIAGNOSIS — Z825 Family history of asthma and other chronic lower respiratory diseases: Secondary | ICD-10-CM | POA: Diagnosis not present

## 2018-05-11 DIAGNOSIS — I25718 Atherosclerosis of autologous vein coronary artery bypass graft(s) with other forms of angina pectoris: Secondary | ICD-10-CM | POA: Diagnosis not present

## 2018-05-11 HISTORY — DX: Migraine, unspecified, not intractable, without status migrainosus: G43.909

## 2018-05-11 HISTORY — DX: Personal history of other diseases of the digestive system: Z87.19

## 2018-05-11 HISTORY — PX: CORONARY ANGIOPLASTY WITH STENT PLACEMENT: SHX49

## 2018-05-11 HISTORY — DX: Flushing: R23.2

## 2018-05-11 HISTORY — PX: LEFT HEART CATH AND CORS/GRAFTS ANGIOGRAPHY: CATH118250

## 2018-05-11 HISTORY — PX: CORONARY STENT INTERVENTION: CATH118234

## 2018-05-11 LAB — POCT ACTIVATED CLOTTING TIME: Activated Clotting Time: 378 seconds

## 2018-05-11 SURGERY — LEFT HEART CATH AND CORS/GRAFTS ANGIOGRAPHY
Anesthesia: LOCAL

## 2018-05-11 MED ORDER — NITROGLYCERIN 1 MG/10 ML FOR IR/CATH LAB
INTRA_ARTERIAL | Status: DC | PRN
  Administered 2018-05-11 (×3): 200 ug via INTRACORONARY

## 2018-05-11 MED ORDER — ACETAMINOPHEN 325 MG PO TABS: 650.0000 mg | ORAL_TABLET | ORAL | Status: DC | PRN

## 2018-05-11 MED ORDER — SODIUM CHLORIDE 0.9 % WEIGHT BASED INFUSION: 1.0000 mL/kg/h | INTRAVENOUS | Status: DC

## 2018-05-11 MED ORDER — NITROGLYCERIN 0.4 MG SL SUBL
0.4000 mg | SUBLINGUAL_TABLET | SUBLINGUAL | Status: DC | PRN
  Administered 2018-05-11: 0.4 mg via SUBLINGUAL

## 2018-05-11 MED ORDER — LABETALOL HCL 5 MG/ML IV SOLN: 10.0000 mg | INTRAVENOUS | Status: AC | PRN

## 2018-05-11 MED ORDER — NITROGLYCERIN 0.4 MG SL SUBL
SUBLINGUAL_TABLET | SUBLINGUAL | Status: AC
  Administered 2018-05-11: 0.4 mg via SUBLINGUAL
  Filled 2018-05-11: qty 3

## 2018-05-11 MED ORDER — ASPIRIN EC 81 MG PO TBEC
81.0000 mg | DELAYED_RELEASE_TABLET | Freq: Every day | ORAL | Status: DC
  Administered 2018-05-12: 10:00:00 81 mg via ORAL
  Filled 2018-05-11 (×2): qty 1

## 2018-05-11 MED ORDER — MIDAZOLAM HCL 2 MG/2ML IJ SOLN
INTRAMUSCULAR | Status: AC
  Filled 2018-05-11: qty 2

## 2018-05-11 MED ORDER — CLOPIDOGREL BISULFATE 75 MG PO TABS
75.0000 mg | ORAL_TABLET | Freq: Every day | ORAL | Status: DC
  Administered 2018-05-12: 09:00:00 75 mg via ORAL
  Filled 2018-05-11: qty 1

## 2018-05-11 MED ORDER — CLOPIDOGREL BISULFATE 300 MG PO TABS
ORAL_TABLET | ORAL | Status: AC
  Filled 2018-05-11: qty 1

## 2018-05-11 MED ORDER — ASPIRIN 81 MG PO CHEW: 81.0000 mg | CHEWABLE_TABLET | ORAL | Status: DC

## 2018-05-11 MED ORDER — SODIUM CHLORIDE 0.9% FLUSH: 3.0000 mL | INTRAVENOUS | Status: DC | PRN

## 2018-05-11 MED ORDER — FENTANYL CITRATE (PF) 100 MCG/2ML IJ SOLN
INTRAMUSCULAR | Status: DC | PRN
  Administered 2018-05-11 (×2): 25 ug via INTRAVENOUS

## 2018-05-11 MED ORDER — SODIUM CHLORIDE 0.9 % IV SOLN: INTRAVENOUS | Status: DC

## 2018-05-11 MED ORDER — LIDOCAINE HCL (PF) 1 % IJ SOLN
INTRAMUSCULAR | Status: DC | PRN
  Administered 2018-05-11: 15 mL

## 2018-05-11 MED ORDER — SODIUM CHLORIDE 0.9 % IV SOLN: 1.7500 mg/kg/h | INTRAVENOUS | Status: AC

## 2018-05-11 MED ORDER — ALBUTEROL SULFATE (2.5 MG/3ML) 0.083% IN NEBU: 2.5000 mg | INHALATION_SOLUTION | Freq: Four times a day (QID) | RESPIRATORY_TRACT | Status: DC | PRN

## 2018-05-11 MED ORDER — LORATADINE 10 MG PO TABS
10.0000 mg | ORAL_TABLET | Freq: Every day | ORAL | Status: DC
  Administered 2018-05-12: 09:00:00 10 mg via ORAL
  Filled 2018-05-11 (×2): qty 1

## 2018-05-11 MED ORDER — LIDOCAINE HCL (PF) 1 % IJ SOLN
INTRAMUSCULAR | Status: AC
  Filled 2018-05-11: qty 30

## 2018-05-11 MED ORDER — SODIUM CHLORIDE 0.9 % IV SOLN: 250.0000 mL | INTRAVENOUS | Status: DC | PRN

## 2018-05-11 MED ORDER — SODIUM CHLORIDE 0.9% FLUSH
3.0000 mL | Freq: Two times a day (BID) | INTRAVENOUS | Status: DC
  Administered 2018-05-11: 3 mL via INTRAVENOUS

## 2018-05-11 MED ORDER — SIMVASTATIN 20 MG PO TABS
40.0000 mg | ORAL_TABLET | Freq: Every day | ORAL | Status: DC
  Administered 2018-05-11: 21:00:00 40 mg via ORAL
  Filled 2018-05-11: qty 2

## 2018-05-11 MED ORDER — ASPIRIN 81 MG PO CHEW: 81.0000 mg | CHEWABLE_TABLET | Freq: Every day | ORAL | Status: DC

## 2018-05-11 MED ORDER — FENTANYL CITRATE (PF) 100 MCG/2ML IJ SOLN
INTRAMUSCULAR | Status: AC
  Filled 2018-05-11: qty 2

## 2018-05-11 MED ORDER — HYDRALAZINE HCL 20 MG/ML IJ SOLN: 5.0000 mg | INTRAMUSCULAR | Status: AC | PRN

## 2018-05-11 MED ORDER — SODIUM CHLORIDE 0.9 % WEIGHT BASED INFUSION
3.0000 mL/kg/h | INTRAVENOUS | Status: DC
  Administered 2018-05-11: 3 mL/kg/h via INTRAVENOUS

## 2018-05-11 MED ORDER — BIVALIRUDIN TRIFLUOROACETATE 250 MG IV SOLR
INTRAVENOUS | Status: AC
  Filled 2018-05-11: qty 250

## 2018-05-11 MED ORDER — IOHEXOL 350 MG/ML SOLN
INTRAVENOUS | Status: DC | PRN
  Administered 2018-05-11: 195 mL via INTRA_ARTERIAL

## 2018-05-11 MED ORDER — SODIUM CHLORIDE 0.9 % IV SOLN
INTRAVENOUS | Status: AC
  Administered 2018-05-11: 18:00:00 via INTRAVENOUS

## 2018-05-11 MED ORDER — NITROGLYCERIN 0.4 MG SL SUBL: 0.4000 mg | SUBLINGUAL_TABLET | SUBLINGUAL | Status: DC | PRN

## 2018-05-11 MED ORDER — HEPARIN (PORCINE) IN NACL 1000-0.9 UT/500ML-% IV SOLN
INTRAVENOUS | Status: DC | PRN
  Administered 2018-05-11: 500 mL

## 2018-05-11 MED ORDER — SODIUM CHLORIDE 0.9% FLUSH: 3.0000 mL | Freq: Two times a day (BID) | INTRAVENOUS | Status: DC

## 2018-05-11 MED ORDER — CLOPIDOGREL BISULFATE 300 MG PO TABS
ORAL_TABLET | ORAL | Status: DC | PRN
  Administered 2018-05-11: 600 mg via ORAL

## 2018-05-11 MED ORDER — IRBESARTAN 300 MG PO TABS
300.0000 mg | ORAL_TABLET | Freq: Every day | ORAL | Status: DC
  Administered 2018-05-12: 300 mg via ORAL
  Filled 2018-05-11: qty 1

## 2018-05-11 MED ORDER — METOPROLOL SUCCINATE ER 50 MG PO TB24
50.0000 mg | ORAL_TABLET | Freq: Every day | ORAL | Status: DC
  Administered 2018-05-11: 21:00:00 50 mg via ORAL
  Filled 2018-05-11: qty 1

## 2018-05-11 MED ORDER — MIDAZOLAM HCL 2 MG/2ML IJ SOLN
INTRAMUSCULAR | Status: DC | PRN
  Administered 2018-05-11: 1 mg via INTRAVENOUS
  Administered 2018-05-11: 2 mg via INTRAVENOUS

## 2018-05-11 MED ORDER — BIVALIRUDIN BOLUS VIA INFUSION - CUPID
INTRAVENOUS | Status: DC | PRN
  Administered 2018-05-11: 45 mg via INTRAVENOUS

## 2018-05-11 MED ORDER — NITROGLYCERIN 1 MG/10 ML FOR IR/CATH LAB
INTRA_ARTERIAL | Status: AC
  Filled 2018-05-11: qty 10

## 2018-05-11 MED ORDER — PREDNISONE 5 MG PO TABS
50.0000 mg | ORAL_TABLET | Freq: Every day | ORAL | Status: DC
  Administered 2018-05-12: 50 mg via ORAL
  Filled 2018-05-11 (×2): qty 2

## 2018-05-11 MED ORDER — MOMETASONE FURO-FORMOTEROL FUM 200-5 MCG/ACT IN AERO
2.0000 | INHALATION_SPRAY | Freq: Two times a day (BID) | RESPIRATORY_TRACT | Status: DC
  Filled 2018-05-11: qty 8.8

## 2018-05-11 MED ORDER — SODIUM CHLORIDE 0.9 % IV SOLN
INTRAVENOUS | Status: AC | PRN
  Administered 2018-05-11: 16:00:00
  Administered 2018-05-11: 1.75 mg/kg/h via INTRAVENOUS

## 2018-05-11 MED ORDER — ONDANSETRON HCL 4 MG/2ML IJ SOLN
4.0000 mg | Freq: Four times a day (QID) | INTRAMUSCULAR | Status: DC | PRN
  Filled 2018-05-11: qty 2

## 2018-05-11 MED ORDER — ANGIOPLASTY BOOK
Freq: Once | Status: AC
  Administered 2018-05-11: 1
  Filled 2018-05-11: qty 1

## 2018-05-11 MED ORDER — MONTELUKAST SODIUM 10 MG PO TABS
10.0000 mg | ORAL_TABLET | Freq: Every evening | ORAL | Status: DC | PRN
  Filled 2018-05-11: qty 1

## 2018-05-11 SURGICAL SUPPLY — 27 items
BALLN EMERGE MR 2.5X12 (BALLOONS) ×2
BALLN SAPPHIRE 1.5X12 (BALLOONS) ×2
BALLN SAPPHIRE 2.0X12 (BALLOONS) ×2
BALLN SAPPHIRE ~~LOC~~ 2.5X12 (BALLOONS) ×1 IMPLANT
BALLN SAPPHIRE ~~LOC~~ 2.5X15 (BALLOONS) ×1 IMPLANT
BALLN SAPPHIRE ~~LOC~~ 2.75X12 (BALLOONS) ×1 IMPLANT
BALLOON EMERGE MR 2.5X12 (BALLOONS) IMPLANT
BALLOON SAPPHIRE 1.5X12 (BALLOONS) IMPLANT
BALLOON SAPPHIRE 2.0X12 (BALLOONS) IMPLANT
CATH INFINITI 5 FR LCB (CATHETERS) ×1 IMPLANT
CATH INFINITI 5FR MULTPACK ANG (CATHETERS) ×1 IMPLANT
CATH LAUNCHER 6FR EBU 3.75 (CATHETERS) ×1 IMPLANT
KIT ENCORE 26 ADVANTAGE (KITS) ×2 IMPLANT
KIT HEART LEFT (KITS) ×2 IMPLANT
KIT HEMO VALVE WATCHDOG (MISCELLANEOUS) ×1 IMPLANT
PACK CARDIAC CATHETERIZATION (CUSTOM PROCEDURE TRAY) ×2 IMPLANT
SHEATH PINNACLE 5F 10CM (SHEATH) ×1 IMPLANT
SHEATH PINNACLE 6F 10CM (SHEATH) ×1 IMPLANT
SHEATH PROBE COVER 6X72 (BAG) ×1 IMPLANT
STENT SYNERGY DES 2.5X16 (Permanent Stent) ×1 IMPLANT
STENT SYNERGY DES 2.75X16 (Permanent Stent) ×1 IMPLANT
TRANSDUCER W/STOPCOCK (MISCELLANEOUS) ×2 IMPLANT
TUBING CIL FLEX 10 FLL-RA (TUBING) ×2 IMPLANT
WIRE ASAHI PROWATER 180CM (WIRE) ×1 IMPLANT
WIRE EMERALD 3MM-J .035X150CM (WIRE) ×1 IMPLANT
WIRE FIGHTER CROSSING 190CM (WIRE) ×1 IMPLANT
WIRE HI TORQ BMW 190CM (WIRE) ×1 IMPLANT

## 2018-05-11 NOTE — Interval H&P Note (Signed)
Cath Lab Visit (complete for each Cath Lab visit)  Clinical Evaluation Leading to the Procedure:   ACS: No.  Non-ACS:    Anginal Classification: CCS III  Anti-ischemic medical therapy: Minimal Therapy (1 class of medications)  Non-Invasive Test Results: No non-invasive testing performed  Prior CABG: Previous CABG  Had angina in the shortstay area today and took SL NTG.   Symptoms accelerating.    History and Physical Interval Note:  05/11/2018 1:54 PM  Melissa Elliott  has presented today for surgery, with the diagnosis of cp  The various methods of treatment have been discussed with the patient and family. After consideration of risks, benefits and other options for treatment, the patient has consented to  Procedure(s): LEFT HEART CATH AND CORS/GRAFTS ANGIOGRAPHY (N/A) as a surgical intervention .  The patient's history has been reviewed, patient examined, no change in status, stable for surgery.  I have reviewed the patient's chart and labs.  Questions were answered to the patient's satisfaction.     Larae Grooms

## 2018-05-11 NOTE — Progress Notes (Signed)
Patient c/o chest pain 6/10 mid to left chest at 1335. Chest pain orders intitiated. 4L/V[place] O2 started, EKG performed. NTG 0.4 mg SL given @ 1339. Pt states pain relief at 1345 after 1 dose of NTG. 0/10 pain present.

## 2018-05-12 ENCOUNTER — Encounter (HOSPITAL_COMMUNITY): Payer: Self-pay | Admitting: Interventional Cardiology

## 2018-05-12 ENCOUNTER — Telehealth: Payer: Self-pay | Admitting: Cardiology

## 2018-05-12 DIAGNOSIS — I25718 Atherosclerosis of autologous vein coronary artery bypass graft(s) with other forms of angina pectoris: Secondary | ICD-10-CM | POA: Diagnosis not present

## 2018-05-12 DIAGNOSIS — I10 Essential (primary) hypertension: Secondary | ICD-10-CM | POA: Diagnosis not present

## 2018-05-12 DIAGNOSIS — Z79899 Other long term (current) drug therapy: Secondary | ICD-10-CM | POA: Diagnosis not present

## 2018-05-12 DIAGNOSIS — Z7982 Long term (current) use of aspirin: Secondary | ICD-10-CM | POA: Diagnosis not present

## 2018-05-12 DIAGNOSIS — Z888 Allergy status to other drugs, medicaments and biological substances status: Secondary | ICD-10-CM | POA: Diagnosis not present

## 2018-05-12 DIAGNOSIS — I25110 Atherosclerotic heart disease of native coronary artery with unstable angina pectoris: Secondary | ICD-10-CM | POA: Diagnosis not present

## 2018-05-12 DIAGNOSIS — E785 Hyperlipidemia, unspecified: Secondary | ICD-10-CM | POA: Diagnosis not present

## 2018-05-12 DIAGNOSIS — I25118 Atherosclerotic heart disease of native coronary artery with other forms of angina pectoris: Secondary | ICD-10-CM | POA: Diagnosis not present

## 2018-05-12 DIAGNOSIS — J45909 Unspecified asthma, uncomplicated: Secondary | ICD-10-CM | POA: Diagnosis not present

## 2018-05-12 DIAGNOSIS — Z880 Allergy status to penicillin: Secondary | ICD-10-CM | POA: Diagnosis not present

## 2018-05-12 LAB — BASIC METABOLIC PANEL WITH GFR
Anion gap: 7 (ref 5–15)
BUN: 19 mg/dL (ref 8–23)
CO2: 21 mmol/L — ABNORMAL LOW (ref 22–32)
Calcium: 8.7 mg/dL — ABNORMAL LOW (ref 8.9–10.3)
Chloride: 112 mmol/L — ABNORMAL HIGH (ref 98–111)
Creatinine, Ser: 0.86 mg/dL (ref 0.44–1.00)
GFR calc Af Amer: >60 mL/min (ref >60)
GFR calc non Af Amer: >60 mL/min (ref >60)
Glucose, Bld: 154 mg/dL — ABNORMAL HIGH (ref 70–99)
Potassium: 4 mmol/L (ref 3.5–5.1)
Sodium: 140 mmol/L (ref 135–145)

## 2018-05-12 LAB — CBC
HCT: 37 % (ref 36.0–46.0)
Hemoglobin: 11.5 g/dL — ABNORMAL LOW (ref 12.0–15.0)
MCH: 27.6 pg (ref 26.0–34.0)
MCHC: 31.1 g/dL (ref 30.0–36.0)
MCV: 88.9 fL (ref 80.0–100.0)
Platelets: 282 K/uL (ref 150–400)
RBC: 4.16 MIL/uL (ref 3.87–5.11)
RDW: 13.1 % (ref 11.5–15.5)
WBC: 16.7 K/uL — ABNORMAL HIGH (ref 4.0–10.5)
nRBC: 0 % (ref 0.0–0.2)

## 2018-05-12 MED ORDER — CLOPIDOGREL BISULFATE 75 MG PO TABS: 75.0000 mg | ORAL_TABLET | Freq: Every day | ORAL | 2 refills | Status: DC

## 2018-05-12 NOTE — Telephone Encounter (Signed)
**Note De-identified Rawson Minix Obfuscation** The pt is being discharged from the hospital today. We will call her tomorrow. 

## 2018-05-12 NOTE — Progress Notes (Signed)
CARDIAC REHAB PHASE I   PRE:  Rate/Rhythm: 24 SR w/ PVCs  BP:  Sitting: 143/55        SaO2:   MODE:  Ambulation: 1000 ft   POST:  Rate/Rhythm: 100 ST  BP:  Sitting: 150/65        SaO2:   0820 - 0923  Pt ambulated 1000 ft with minimal assistance at first and no assistance at the end. Tolerated well. Gait was steady and quick. Pt denies SOB and pain. Pt given ed on stent, restrictions, and importance of plavix and ASA. Pt given ed on NTG usage, diet (HH), and exercise. Pt referred to [facility] at [facility]. Pt in bed with call bell.   Philis Kendall, MS 05/12/2018 9:18 AM

## 2018-05-12 NOTE — Progress Notes (Signed)
Site area: right groin  Site Prior to Removal:  Level 0  Pressure Applied For 20 MINUTES    Minutes Beginning at 19:56  Manual:   Yes.    Patient Status During Pull:  WNL  Post Pull Groin Site:  Level 0  Post Pull Instructions Given:  Yes.    Post Pull Pulses Present:  Yes.    Dressing Applied:  Yes.    Comments:  Pt tolerated well.

## 2018-05-12 NOTE — Telephone Encounter (Signed)
TOC Patient-  Please call Patient-  Pt has an appointment with Melissa Elliott on 05-20-18.

## 2018-05-12 NOTE — Progress Notes (Signed)
Discharge instructions reviewed with the patient to include new and changed medications, Activity, follow up appointments, Diet and radial site care.  Patient voices understanding to teaching. Printed copy given to the patient.  Ambulatory to the door.  Home via [place] with her husband driving.

## 2018-05-12 NOTE — Discharge Summary (Addendum)
Discharge Summary    Patient ID: Melissa Elliott,  MRN: [id number], DOB/AGE: 1941-02-28 77 y.o.  Admit date: 05/11/2018 Discharge date: 05/12/2018  Primary Care Provider: Wenda Low Primary Cardiologist: Dr. Irish Lack   Discharge Diagnoses    Active Problems:   Angina pectoris ([place])   HTN (hypertension)   HLD (hyperlipidemia)   Allergies Allergies  Allergen Reactions  . Ciprofloxacin Nausea And Vomiting    Severe "dry heaves"  . Sulfa Antibiotics Swelling    Swelling in face, neck, arms, hands.   . Iodine Hives  . Eggs Or Egg-Derived Products Nausea And Vomiting    "sometimes I can eat eggs; sometimes I can't"  . Penicillins     As a child Has patient had a PCN reaction causing immediate rash, facial/tongue/throat swelling, SOB or lightheadedness with hypotension: Unknown Has patient had a PCN reaction causing severe rash involving mucus membranes or skin necrosis: Unknown Has patient had a PCN reaction that required hospitalization: Unknown Has patient had a PCN reaction occurring within the last 10 years: Unknown If all of the above answers are "NO", then may proceed with Cephalosporin use.     Diagnostic Studies/Procedures    Cath: 05/11/18   Mid RCA lesion is 100% stenosed. Left to right collaterals present.  Prox LAD lesion is 100% stenosed. LIMA to LAD is patent.  Mid LAD to Dist LAD lesion is 25% stenosed.  SVG to OM was occluded.  Lat 1st Mrg lesion is 95% stenosed.  A drug-eluting stent was successfully placed using a STENT SYNERGY DES 2.5X16.  Post intervention, there is a 0% residual stenosis.  1st Mrg lesion is 95% stenosed.  A drug-eluting stent was successfully placed using a STENT SYNERGY DES 2.75X16.  Post intervention, there is a 0% residual stenosis.  The left ventricular ejection fraction is 50-55% by visual estimate.  The left ventricular systolic function is normal.  LV end diastolic pressure is normal.  There is no  aortic valve stenosis.  [place] bifurcation stenting of the bifurcation in the OM/lateral OM branch.   [place] bifurcation stenting of the bifurcation in the OM/lateral OM branch.  Recommend uninterrupted dual antiplatelet therapy with Aspirin 81mg  daily and Clopidogrel 75mg  daily for a minimum of 6 months (stable ischemic heart disease - Class I recommendation).   Continue aggressive secondary prevention.  Consdier PCI attempt of the RCA if she has refractory angina.  I suspect this can be medically treated.  _____________   History of Present Illness     77 y.o. female  with CAD. CABG x 2 in 1996 @ [facility] with Dr. Roderick Pee. She had several stomach ulcers several years ago; likely secondary to meloxicam. Not taking NSAIDs now.  She felt a littlealittle throat tightness in Jan 2017. We performed a stress test. This showed a questionable inferior wall defect. Sx resolved with asthma treatment.Since the last visit, she had noticed some DOE. She got out of the habit of regular exercise in 2019 ater watching her grandbaby in [place]. She did do a 5K in April 2019.    This dyspnea seemed different than what she had when her asthma was not controlled. These sx are similar to what she had in 1998.  No NTG use. Given her symptoms and abnormal stress test in the past, she was set up for outpatient cardiac cath.   Hospital Course     Underwent cardiac cath noted above with bifurcation lesion of the OM/lateral OM branch with  successful PCI/DES x2. Patent LIMA to LAD, but occluded SVG to OM. CTO of the mRCA, but collaterals noted. Plan for DAPT with ASA/plavix for at least 6 months. Medical management of the RCA, unless recurrent angina noted. No complications noted over night. Worked well with cardiac rehab. Home medications were continued the same.    General: Well developed, well nourished, female appearing in no acute distress. Head: Normocephalic,  atraumatic.  Neck: Supple without bruits, JVD. Lungs:  Resp regular and unlabored, CTA. Heart: RRR, S1, S2, no S3, S4, or murmur; no rub. Abdomen: Soft, non-tender, non-distended with normoactive bowel sounds. No hepatomegaly. No rebound/guarding. No obvious abdominal masses. Extremities: No clubbing, cyanosis, edema. Distal pedal pulses are 2+ bilaterally. R femoral cath site stable without bruising or hematoma Neuro: Alert and oriented X 3. Moves all extremities spontaneously. Psych: Normal affect.  Melissa Elliott was seen by Dr. Angelena Form and determined stable for discharge home. Follow up in the office has been arranged. Medications are listed below.   _____________  Discharge Vitals Blood pressure 128/67, pulse 67, temperature 97.9 F (36.6 C), resp. rate 18, height 5\' 4"  (1.626 m), weight 60.5 kg, SpO2 97 %.  Filed Weights   05/11/18 1146 05/12/18 0610  Weight: 60 kg 60.5 kg    Labs & Radiologic Studies    CBC Recent Labs    05/12/18 0331  WBC 16.7*  HGB 11.5*  HCT 37.0  MCV 88.9  PLT 353   Basic Metabolic Panel Recent Labs    05/12/18 0331  NA 140  K 4.0  CL 112*  CO2 21*  GLUCOSE 154*  BUN 19  CREATININE 0.86  CALCIUM 8.7*   Liver Function Tests No results for input(s): AST, ALT, ALKPHOS, BILITOT, PROT, ALBUMIN in the last 72 hours. No results for input(s): LIPASE, AMYLASE in the last 72 hours. Cardiac Enzymes No results for input(s): CKTOTAL, CKMB, CKMBINDEX, TROPONINI in the last 72 hours. BNP Invalid input(s): POCBNP D-Dimer No results for input(s): DDIMER in the last 72 hours. Hemoglobin A1C No results for input(s): HGBA1C in the last 72 hours. Fasting Lipid Panel No results for input(s): CHOL, HDL, LDLCALC, TRIG, CHOLHDL, LDLDIRECT in the last 72 hours. Thyroid Function Tests No results for input(s): TSH, T4TOTAL, T3FREE, THYROIDAB in the last 72 hours.  Invalid input(s): FREET3 _____________  No results found. Disposition   Pt is  being discharged home today in good condition.  Follow-up Plans & Appointments    Follow-up Information    Daune Perch, NP Follow up on 05/20/2018.   Specialty:  Nurse Practitioner Why:  at 2:30pm for your follow up appt.  Contact information: [address] [place] [address] [phone]          Discharge Instructions    AMB Referral to Cardiac Rehabilitation - Phase II   Complete by:  As directed    Diagnosis:  Coronary Stents   Call MD for:  redness, tenderness, or signs of infection (pain, swelling, redness, odor or green/yellow discharge around incision site)   Complete by:  As directed    Diet - low sodium heart healthy   Complete by:  As directed    Discharge instructions   Complete by:  As directed    Groin Site Care Refer to this sheet in the next few weeks. These instructions provide you with information on caring for yourself after your procedure. Your caregiver may also give you more specific instructions. Your treatment has been planned according to  current medical practices, but problems sometimes occur. Call your caregiver if you have any problems or questions after your procedure. HOME CARE INSTRUCTIONS You may shower 24 hours after the procedure. Remove the bandage (dressing) and gently wash the site with plain soap and water. Gently pat the site dry.  Do not apply powder or lotion to the site.  Do not sit in a bathtub, swimming pool, or whirlpool for 5 to 7 days.  No bending, squatting, or lifting anything over 10 pounds (4.5 kg) as directed by your caregiver.  Inspect the site at least twice daily.  Do not drive home if you are discharged the same day of the procedure. Have someone else drive you.  You may drive 24 hours after the procedure unless otherwise instructed by your caregiver.  What to expect: Any bruising will usually fade within 1 to 2 weeks.  Blood that collects in the tissue (hematoma) may be painful to the touch. It should  usually decrease in size and tenderness within 1 to 2 weeks.  SEEK IMMEDIATE MEDICAL CARE IF: You have unusual pain at the groin site or down the affected leg.  You have redness, warmth, swelling, or pain at the groin site.  You have drainage (other than a small amount of blood on the dressing).  You have chills.  You have a fever or persistent symptoms for more than 72 hours.  You have a fever and your symptoms suddenly get worse.  Your leg becomes pale, cool, tingly, or numb.  You have heavy bleeding from the site. Hold pressure on the site. Marland Kitchen  PLEASE DO NOT MISS ANY DOSES OF YOUR PLAVIX!!!!! Also keep a log of you blood pressures and bring back to your follow up appt. Please call the office with any questions.   Patients taking blood thinners should generally stay away from medicines like ibuprofen, Advil, Motrin, naproxen, and Aleve due to risk of stomach bleeding. You may take Tylenol as directed or talk to your primary doctor about alternatives.   Increase activity slowly   Complete by:  As directed        Discharge Medications     Medication List    STOP taking these medications   predniSONE 50 MG tablet Commonly known as:  DELTASONE     TAKE these medications   ADVAIR DISKUS 250-50 MCG/DOSE Aepb Generic drug:  Fluticasone-Salmeterol Inhale 1 puff into the lungs 2 (two) times daily. Only uses in winter and spring months   albuterol 108 (90 Base) MCG/ACT inhaler Commonly known as:  PROVENTIL HFA;VENTOLIN HFA Inhale 2 puffs into the lungs every 6 (six) hours as needed for wheezing.   aspirin EC 81 MG EC tablet Generic drug:  aspirin Take 81 mg by mouth daily. Swallow whole.   Calcium-D 600-400 MG-UNIT Tabs Take 1 tablet by mouth daily with lunch.   cholecalciferol 1000 units tablet Commonly known as:  VITAMIN D Take 1,000 Units by mouth daily with lunch.   clopidogrel 75 MG tablet Commonly known as:  PLAVIX Take 1 tablet (75 mg total) by mouth daily with  breakfast.   fexofenadine 180 MG tablet Commonly known as:  ALLEGRA Take 180 mg by mouth daily as needed for allergies or rhinitis.   irbesartan 300 MG tablet Commonly known as:  AVAPRO Take 1 tablet (300 mg total) by mouth daily.   metoprolol succinate 50 MG 24 hr tablet Commonly known as:  TOPROL-XL Take 50 mg by mouth at bedtime.   montelukast 10  MG tablet Commonly known as:  SINGULAIR Take 1 tablet (10 mg total) by mouth at bedtime. What changed:    when to take this  reasons to take this   nitroGLYCERIN 0.4 MG SL tablet Commonly known as:  NITROSTAT Place 0.4 mg under the tongue every 5 (five) minutes as needed for chest pain (UP TO 3 DOSES BEFORE CALLING EMS).   PRESERVISION AREDS 2 Caps Take 1 capsule by mouth 2 (two) times daily.   simvastatin 40 MG tablet Commonly known as:  ZOCOR Take 40 mg by mouth at bedtime.        Acute coronary syndrome (MI, NSTEMI, STEMI, etc) this admission?: No.     Outstanding Labs/Studies   N/a  Duration of Discharge Encounter   Greater than 30 minutes including physician time.  Signed, Reino Bellis NP-C 05/12/2018, 8:57 AM   I have personally seen and examined this patient with Reino Bellis, NP I agree with the assessment and plan as outlined above. She is s/p PCI/stenting of the circ/OM yesterday. Doing well this am. Discharge home today on ASA and Plavix.   Lauree Chandler 05/12/2018 9:04 AM

## 2018-05-12 NOTE — Telephone Encounter (Signed)
Patient is currently admitted, will try to reach later.

## 2018-05-13 ENCOUNTER — Emergency Department (HOSPITAL_COMMUNITY): Payer: Medicare HMO

## 2018-05-13 ENCOUNTER — Encounter (HOSPITAL_COMMUNITY): Payer: Self-pay | Admitting: Emergency Medicine

## 2018-05-13 ENCOUNTER — Observation Stay (HOSPITAL_COMMUNITY)
Admission: EM | Admit: 2018-05-13 | Discharge: 2018-05-14 | Disposition: A | Discharge status: Home / Self Care | Payer: Medicare HMO | Attending: Cardiovascular Disease | Admitting: Cardiovascular Disease

## 2018-05-13 ENCOUNTER — Telehealth: Payer: Self-pay | Admitting: Interventional Cardiology

## 2018-05-13 ENCOUNTER — Other Ambulatory Visit: Payer: Self-pay

## 2018-05-13 DIAGNOSIS — R42 Dizziness and giddiness: Secondary | ICD-10-CM | POA: Diagnosis not present

## 2018-05-13 DIAGNOSIS — I252 Old myocardial infarction: Secondary | ICD-10-CM | POA: Insufficient documentation

## 2018-05-13 DIAGNOSIS — I25710 Atherosclerosis of autologous vein coronary artery bypass graft(s) with unstable angina pectoris: Principal | ICD-10-CM | POA: Insufficient documentation

## 2018-05-13 DIAGNOSIS — Z883 Allergy status to other anti-infective agents status: Secondary | ICD-10-CM | POA: Insufficient documentation

## 2018-05-13 DIAGNOSIS — Z7902 Long term (current) use of antithrombotics/antiplatelets: Secondary | ICD-10-CM | POA: Insufficient documentation

## 2018-05-13 DIAGNOSIS — I214 Non-ST elevation (NSTEMI) myocardial infarction: Secondary | ICD-10-CM | POA: Diagnosis present

## 2018-05-13 DIAGNOSIS — Z880 Allergy status to penicillin: Secondary | ICD-10-CM | POA: Insufficient documentation

## 2018-05-13 DIAGNOSIS — E785 Hyperlipidemia, unspecified: Secondary | ICD-10-CM | POA: Diagnosis not present

## 2018-05-13 DIAGNOSIS — Z7951 Long term (current) use of inhaled steroids: Secondary | ICD-10-CM | POA: Insufficient documentation

## 2018-05-13 DIAGNOSIS — J45909 Unspecified asthma, uncomplicated: Secondary | ICD-10-CM | POA: Diagnosis not present

## 2018-05-13 DIAGNOSIS — Z7982 Long term (current) use of aspirin: Secondary | ICD-10-CM | POA: Diagnosis not present

## 2018-05-13 DIAGNOSIS — I2582 Chronic total occlusion of coronary artery: Secondary | ICD-10-CM | POA: Insufficient documentation

## 2018-05-13 DIAGNOSIS — Z955 Presence of coronary angioplasty implant and graft: Secondary | ICD-10-CM | POA: Diagnosis not present

## 2018-05-13 DIAGNOSIS — E7800 Pure hypercholesterolemia, unspecified: Secondary | ICD-10-CM | POA: Insufficient documentation

## 2018-05-13 DIAGNOSIS — Z8249 Family history of ischemic heart disease and other diseases of the circulatory system: Secondary | ICD-10-CM | POA: Insufficient documentation

## 2018-05-13 DIAGNOSIS — Z882 Allergy status to sulfonamides status: Secondary | ICD-10-CM | POA: Insufficient documentation

## 2018-05-13 DIAGNOSIS — I10 Essential (primary) hypertension: Secondary | ICD-10-CM | POA: Insufficient documentation

## 2018-05-13 DIAGNOSIS — R079 Chest pain, unspecified: Secondary | ICD-10-CM | POA: Diagnosis not present

## 2018-05-13 LAB — CBC
HCT: 41.1 % (ref 36.0–46.0)
Hemoglobin: 13.3 g/dL (ref 12.0–15.0)
MCH: 29.1 pg (ref 26.0–34.0)
MCHC: 32.4 g/dL (ref 30.0–36.0)
MCV: 89.9 fL (ref 80.0–100.0)
PLATELETS: 298 10*3/uL (ref 150–400)
RBC: 4.57 MIL/uL (ref 3.87–5.11)
RDW: 13.2 % (ref 11.5–15.5)
WBC: 12.1 10*3/uL — ABNORMAL HIGH (ref 4.0–10.5)
nRBC: 0 % (ref 0.0–0.2)

## 2018-05-13 LAB — BASIC METABOLIC PANEL
Anion gap: 9 (ref 5–15)
BUN: 22 mg/dL (ref 8–23)
CALCIUM: 9 mg/dL (ref 8.9–10.3)
CO2: 26 mmol/L (ref 22–32)
CREATININE: 1.09 mg/dL — AB (ref 0.44–1.00)
Chloride: 106 mmol/L (ref 98–111)
GFR calc Af Amer: 56 mL/min — ABNORMAL LOW (ref >60)
GFR, EST NON AFRICAN AMERICAN: 48 mL/min — AB (ref >60)
GLUCOSE: 138 mg/dL — AB (ref 70–99)
Potassium: 3.3 mmol/L — ABNORMAL LOW (ref 3.5–5.1)
Sodium: 141 mmol/L (ref 135–145)

## 2018-05-13 LAB — I-STAT TROPONIN, ED: Troponin i, poc: 2.66 ng/mL (ref 0.00–0.08)

## 2018-05-13 LAB — APTT: APTT: 28 s (ref 24–36)

## 2018-05-13 LAB — PROTIME-INR
INR: 0.91
Prothrombin Time: 12.1 seconds (ref 11.4–15.2)

## 2018-05-13 LAB — TROPONIN I: Troponin I: 2.33 ng/mL (ref <0.03)

## 2018-05-13 MED ORDER — HEPARIN (PORCINE) IN NACL 100-0.45 UNIT/ML-% IJ SOLN
750.0000 [IU]/h | INTRAMUSCULAR | Status: DC
  Administered 2018-05-13: 750 [IU]/h via INTRAVENOUS
  Filled 2018-05-13: qty 250

## 2018-05-13 MED ORDER — SIMVASTATIN 40 MG PO TABS
40.0000 mg | ORAL_TABLET | Freq: Every day | ORAL | Status: DC
  Administered 2018-05-13: 40 mg via ORAL
  Filled 2018-05-13: qty 1

## 2018-05-13 MED ORDER — ASPIRIN 81 MG PO CHEW
324.0000 mg | CHEWABLE_TABLET | ORAL | Status: DC
  Filled 2018-05-13: qty 4

## 2018-05-13 MED ORDER — METOPROLOL SUCCINATE ER 50 MG PO TB24
50.0000 mg | ORAL_TABLET | Freq: Every day | ORAL | Status: DC
  Administered 2018-05-13: 50 mg via ORAL
  Filled 2018-05-13: qty 1

## 2018-05-13 MED ORDER — HEPARIN BOLUS VIA INFUSION
3600.0000 [IU] | Freq: Once | INTRAVENOUS | Status: AC
  Administered 2018-05-13: 3600 [IU] via INTRAVENOUS
  Filled 2018-05-13: qty 3600

## 2018-05-13 MED ORDER — IRBESARTAN 300 MG PO TABS: 300.0000 mg | ORAL_TABLET | Freq: Every day | ORAL | Status: DC

## 2018-05-13 MED ORDER — MONTELUKAST SODIUM 10 MG PO TABS
10.0000 mg | ORAL_TABLET | Freq: Every day | ORAL | Status: DC
  Administered 2018-05-13: 10 mg via ORAL
  Filled 2018-05-13: qty 1

## 2018-05-13 MED ORDER — PREDNISONE 20 MG PO TABS
50.0000 mg | ORAL_TABLET | Freq: Four times a day (QID) | ORAL | Status: DC
  Administered 2018-05-14 (×2): 50 mg via ORAL
  Filled 2018-05-13 (×2): qty 3

## 2018-05-13 MED ORDER — VITAMIN D 1000 UNITS PO TABS: 1000.0000 [IU] | ORAL_TABLET | Freq: Every day | ORAL | Status: DC

## 2018-05-13 MED ORDER — ACETAMINOPHEN 325 MG PO TABS: 650.0000 mg | ORAL_TABLET | ORAL | Status: DC | PRN

## 2018-05-13 MED ORDER — CALCIUM CARBONATE-VITAMIN D 500-200 MG-UNIT PO TABS
1.0000 | ORAL_TABLET | Freq: Every day | ORAL | Status: DC
  Filled 2018-05-13: qty 1

## 2018-05-13 MED ORDER — CLOPIDOGREL BISULFATE 75 MG PO TABS
75.0000 mg | ORAL_TABLET | Freq: Every day | ORAL | Status: DC
  Administered 2018-05-14: 75 mg via ORAL
  Filled 2018-05-13: qty 1

## 2018-05-13 MED ORDER — ASPIRIN 300 MG RE SUPP: 300.0000 mg | RECTAL | Status: DC

## 2018-05-13 MED ORDER — ONDANSETRON HCL 4 MG/2ML IJ SOLN: 4.0000 mg | Freq: Four times a day (QID) | INTRAMUSCULAR | Status: DC | PRN

## 2018-05-13 MED ORDER — PROSIGHT PO TABS
1.0000 | ORAL_TABLET | Freq: Two times a day (BID) | ORAL | Status: DC
  Filled 2018-05-13 (×2): qty 1

## 2018-05-13 MED ORDER — ALBUTEROL SULFATE HFA 108 (90 BASE) MCG/ACT IN AERS: 2.0000 | INHALATION_SPRAY | Freq: Four times a day (QID) | RESPIRATORY_TRACT | Status: DC | PRN

## 2018-05-13 MED ORDER — LORATADINE 10 MG PO TABS: 10.0000 mg | ORAL_TABLET | Freq: Every day | ORAL | Status: DC

## 2018-05-13 MED ORDER — ASPIRIN EC 81 MG PO TBEC
81.0000 mg | DELAYED_RELEASE_TABLET | Freq: Every day | ORAL | Status: DC
  Filled 2018-05-13: qty 1

## 2018-05-13 MED ORDER — NITROGLYCERIN 0.4 MG SL SUBL: 0.4000 mg | SUBLINGUAL_TABLET | SUBLINGUAL | Status: DC | PRN

## 2018-05-13 MED ORDER — SODIUM CHLORIDE 0.9 % IV SOLN
INTRAVENOUS | Status: DC
  Administered 2018-05-13: 19:00:00 via INTRAVENOUS
  Administered 2018-05-14: 1000 mL via INTRAVENOUS

## 2018-05-13 NOTE — Telephone Encounter (Signed)
Patient contacted on 05/13/18 @ 12:48pm Patient understands to follow up with  Provider on 05/20/2018 2 2:30pm with Daune Perch Patient understands discharge instructions? Yes Patient understands medications and regimen? Yes Patient understands to bring all medications to this visit?  Patient feeling well with no symptoms. She has assistance from husband in home and neighbors.  She has all her medications. She understands appt dates and times. She appreciates call.

## 2018-05-13 NOTE — ED Triage Notes (Signed)
Pt reports she started having L-side CP no radiation x 1.5 hours. Pt reports she had 2 stents placed on Tuesday. Pt reports some light-headedness and fatigue. Pt denies nausea, vomiting, shortness of breath.

## 2018-05-13 NOTE — Progress Notes (Signed)
ANTICOAGULATION CONSULT NOTE - Initial Consult  Pharmacy Consult for heparin Indication: chest pain/ACS  Allergies  Allergen Reactions  . Ciprofloxacin Nausea And Vomiting    Severe "dry heaves"  . Sulfa Antibiotics Swelling    Swelling in face, neck, arms, hands.   . Iodine Hives  . Eggs Or Egg-Derived Products Nausea And Vomiting    "sometimes I can eat eggs; sometimes I can't"  . Penicillins     As a child Has patient had a PCN reaction causing immediate rash, facial/tongue/throat swelling, SOB or lightheadedness with hypotension: Unknown Has patient had a PCN reaction causing severe rash involving mucus membranes or skin necrosis: Unknown Has patient had a PCN reaction that required hospitalization: Unknown Has patient had a PCN reaction occurring within the last 10 years: Unknown If all of the above answers are "NO", then may proceed with Cephalosporin use.     Patient Measurements:   Heparin Dosing Weight: 60 kg  Vital Signs: Temp: 98.1 F (36.7 C) (10/31 1743) Temp Source: Oral (10/31 1743) BP: 178/82 (10/31 1845) Pulse Rate: 78 (10/31 1845)  Labs: Recent Labs    05/12/18 0331 05/13/18 1750  HGB 11.5* 13.3  HCT 37.0 41.1  PLT 282 298  CREATININE 0.86  --     Estimated Creatinine Clearance: 48.1 mL/min (by C-G formula based on SCr of 0.86 mg/dL).   Medical History: Past Medical History:  Diagnosis Date  . Asthma   . Complication of anesthesia    "takes a long time to wakeup; sometimes days" (05/11/2018)  . Coronary artery disease   . H/O hiatal hernia   . Heart murmur   . High cholesterol    "on RX because of the bypass" (02/01/2013)  . History of duodenal ulcer   . Hot flashes    "on a regular basis" (05/11/2018)  . Hypertension   . Hypoglycemia    "that's something I have to watch constantly" (02/01/2013)  . Migraine    "probably stopped w/menses" (05/11/2018)  . Pneumonia ? 1970's   "once" (05/11/2018)  . Sciatic nerve pain 12/2012   "RLE;  just had it once" (05/11/2018)    Assessment: Pt is a 28 YOF presenting with ACS/chest pain. Pharmacy has been consulted for heparin for ACS/chest pain. No PTA anticoagulation. Pt had coronary stent placed 05/11/18. Troponin - 2.66, Plt - 298, Hgb - 13.3  Goal of Therapy:  Heparin level 0.3-0.7 units/ml Monitor platelets by anticoagulation protocol: Yes   Plan:  Heparin 3600 bolus x1, then heparin gtt 750 units/hr 8-hour heparin level with AM labs Daily heparin level and CBC  Melissa Elliott 05/13/2018,7:41 PM

## 2018-05-13 NOTE — ED Notes (Signed)
Informed triage nurse of I-stat troponin 2.66

## 2018-05-13 NOTE — H&P (Addendum)
Cardiology Admission History and Physical:   Patient ID: Melissa Elliott MRN: [id number]; DOB: 1940-10-06   Admission date: 05/13/2018  Primary Care Provider: Wenda Low, MD Primary Cardiologist: Larae Grooms, MD Primary Electrophysiologist:  None   Chief Complaint:  Chest pain  Patient Profile:   Melissa Elliott is a 77 y.o. female with past medical history of CAD s/p CABG x2 in 1996 at [facility], history of stomach ulcer secondary to meloxicam, hypertension, and hyperlipidemia.  Patient had a previous stress test in 2017 that showed questionable inferior wall defect.  Patient was recently evaluated by Dr. Illene Bolus for dyspnea and malaise.  Her symptom was similar to what she had experienced back in 1998.  Therefore cardiac catheterization was recommended.  She underwent cardiac catheterization on 05/11/2018 which showed a patent LIMA to LAD, occluded SVG to OM.  CTO of mid RCA with distal collaterals.  Patient had a significant bifurcation lesion at obtuse marginal branch treated with drug-eluting stent x2.  Postprocedure, patient was placed on aspirin and Plavix.  RCA was managed medically.  She was discharged on 05/12/2018.  She initially felt well, however by the following day, she started having significant fatigue and intermittent left-sided chest pain the last update 1 to 2 minutes each.  She says she did not have any chest pain prior to the recent cardiac catheterization and if this is something new.  She is eventually sought medical attention at [facility] ED.  EKG does not show significant ST changes, it does show some nonspecific T wave changes.  Initial point-of-care troponin is 2.66.  According to patient, her chest pain has been intermittent for the past 3 hours.  She is currently chest pain-free.  Chest x-ray does not show any acute issues.  Cardiology has been consulted for NSTEMI.  History of Present Illness:   Melissa Elliott is a 77  yo female with past medical history of CAD status post CABG x2 in 1996 at [facility], history of stomach ulcer secondary to meloxicam, hypertension, and hyperlipidemia.  Patient had a previous stress test in 2017 that showed questionable inferior wall defect.  Patient was recently evaluated by Dr. Illene Bolus for dyspnea and malaise.  Her symptom was similar to what she had experienced back in 1998.  Therefore cardiac catheterization was recommended.  She underwent cardiac catheterization on 05/11/2018 which showed a patent LIMA to LAD, occluded SVG to OM.  CTO of mid RCA with distal collaterals.  Patient had a significant bifurcation lesion at obtuse marginal branch treated with drug-eluting stent x2.  Postprocedure, patient was placed on aspirin and Plavix.  RCA was managed medically.  She was discharged on 05/12/2018.  She initially felt well, however by the following day, she started having significant fatigue and intermittent left-sided chest pain the last update 1 to 2 minutes each.  She says she did not have any chest pain prior to the recent cardiac catheterization and if this is something new.  She is eventually sought medical attention at [facility] ED.  EKG does not show significant ST changes, it does show some nonspecific T wave changes.  Initial point-of-care troponin is 2.66.  According to patient, her chest pain has been intermittent for the past 3 hours.  She is currently chest pain-free.  Chest x-ray does not show any acute issues.  Cardiology has been consulted for NSTEMI.   Past Medical History:  Diagnosis Date  . Asthma   . Complication  of anesthesia    "takes a long time to wakeup; sometimes days" (05/11/2018)  . Coronary artery disease   . H/O hiatal hernia   . Heart murmur   . High cholesterol    "on RX because of the bypass" (02/01/2013)  . History of duodenal ulcer   . Hot flashes    "on a regular basis" (05/11/2018)  . Hypertension   .  Hypoglycemia    "that's something I have to watch constantly" (02/01/2013)  . Migraine    "probably stopped w/menses" (05/11/2018)  . Pneumonia ? 1970's   "once" (05/11/2018)  . Sciatic nerve pain 12/2012   "RLE; just had it once" (05/11/2018)    Past Surgical History:  Procedure Laterality Date  . APPENDECTOMY  1970  . BREAST BIOPSY Left    benign  . COLONOSCOPY WITH PROPOFOL N/A 06/19/2015   Procedure: COLONOSCOPY WITH PROPOFOL;  Surgeon: Garlan Fair, MD;  Location: [facility] ENDOSCOPY;  Service: Endoscopy;  Laterality: N/A;  . CORONARY ANGIOPLASTY WITH STENT PLACEMENT  05/11/2018  . CORONARY ANGIOPLASTY WITH STENT PLACEMENT  1997  . CORONARY ARTERY BYPASS GRAFT  ~ 1997   CABG X2  . CORONARY STENT INTERVENTION N/A 05/11/2018   Procedure: CORONARY STENT INTERVENTION;  Surgeon: Jettie Booze, MD;  Location: [facility];  Service: Cardiovascular;  Laterality: N/A;  . ESOPHAGOGASTRODUODENOSCOPY N/A 02/02/2013   Procedure: ESOPHAGOGASTRODUODENOSCOPY (EGD);  Surgeon: Wonda Horner, MD;  Location: [facility] ENDOSCOPY;  Service: Endoscopy;  Laterality: N/A;  . LEFT HEART CATH AND CORS/GRAFTS ANGIOGRAPHY N/A 05/11/2018   Procedure: LEFT HEART CATH AND CORS/GRAFTS ANGIOGRAPHY;  Surgeon: Jettie Booze, MD;  Location: [facility];  Service: Cardiovascular;  Laterality: N/A;  . SKIN LESION EXCISION Left 1960's   "upper arm; sore that never healed" (02/01/2013)  . TONSILLECTOMY  1940's     Medications Prior to Admission: Prior to Admission medications   Medication Sig Start Date End Date Taking? Authorizing Provider  ADVAIR DISKUS 250-50 MCG/DOSE AEPB Inhale 1 puff into the lungs 2 (two) times daily. Only uses in winter and spring months 09/13/15  Yes [provider]  albuterol (PROVENTIL HFA;VENTOLIN HFA) 108 (90 Base) MCG/ACT inhaler Inhale 2 puffs into the lungs every 6 (six) hours as needed for wheezing. 04/18/16  Yes Javier Glazier, MD  aspirin (ASPIRIN EC) 81  MG EC tablet Take 81 mg by mouth daily. Swallow whole.   Yes [provider]  Calcium Carbonate-Vitamin D (CALCIUM-D) 600-400 MG-UNIT TABS Take 1 tablet by mouth daily with lunch.   Yes [provider]  cholecalciferol (VITAMIN D) 1000 units tablet Take 1,000 Units by mouth daily with lunch.   Yes [provider]  clopidogrel (PLAVIX) 75 MG tablet Take 1 tablet (75 mg total) by mouth daily with breakfast. 05/12/18  Yes Cheryln Manly, NP  fexofenadine (ALLEGRA) 180 MG tablet Take 180 mg by mouth daily as needed for allergies or rhinitis.   Yes [provider]  irbesartan (AVAPRO) 300 MG tablet Take 1 tablet (300 mg total) by mouth daily. 05/06/18  Yes Jettie Booze, MD  metoprolol succinate (TOPROL-XL) 50 MG 24 hr tablet Take 50 mg by mouth at bedtime.  04/14/14  Yes [provider]  montelukast (SINGULAIR) 10 MG tablet Take 1 tablet (10 mg total) by mouth at bedtime. Patient taking differently: Take 10 mg by mouth at bedtime as needed (allergies).  04/18/16  Yes Javier Glazier, MD  Multiple Vitamins-Minerals (PRESERVISION AREDS 2)  CAPS Take 1 capsule by mouth 2 (two) times daily.   Yes [provider]  nitroGLYCERIN (NITROSTAT) 0.4 MG SL tablet Place 0.4 mg under the tongue every 5 (five) minutes as needed for chest pain (UP TO 3 DOSES BEFORE CALLING EMS).   Yes [provider]  simvastatin (ZOCOR) 40 MG tablet Take 40 mg by mouth at bedtime.    Yes [provider]     Allergies:    Allergies  Allergen Reactions  . Ciprofloxacin Nausea And Vomiting    Severe "dry heaves"  . Sulfa Antibiotics Swelling    Swelling in face, neck, arms, hands.   . Iodine Hives  . Eggs Or Egg-Derived Products Nausea And Vomiting    "sometimes I can eat eggs; sometimes I can't"  . Penicillins     As a child Has patient had a PCN reaction causing immediate rash, facial/tongue/throat swelling, SOB or lightheadedness with  hypotension: Unknown Has patient had a PCN reaction causing severe rash involving mucus membranes or skin necrosis: Unknown Has patient had a PCN reaction that required hospitalization: Unknown Has patient had a PCN reaction occurring within the last 10 years: Unknown If all of the above answers are "NO", then may proceed with Cephalosporin use.     Social History:   Social History   Socioeconomic History  . Marital status: Married    Spouse name: Not on file  . Number of children: 3  . Years of education: Not on file  . Highest education level: Not on file  Occupational History  . Occupation: retired  Scientific laboratory technician  . Financial resource strain: Not on file  . Food insecurity:    Worry: Not on file    Inability: Not on file  . Transportation needs:    Medical: Not on file    Non-medical: Not on file  Tobacco Use  . Smoking status: Never Smoker  . Smokeless tobacco: Never Used  . Tobacco comment: Exposure through parents.  Substance and Sexual Activity  . Alcohol use: Yes    Alcohol/week: 0.0 standard drinks    Comment: 05/11/2018  "glass of wine maybe once/month"  . Drug use: Never  . Sexual activity: Yes  Lifestyle  . Physical activity:    Days per week: Not on file    Minutes per session: Not on file  . Stress: Not on file  Relationships  . Social connections:    Talks on phone: Not on file    Gets together: Not on file    Attends religious service: Not on file    Active member of club or organization: Not on file    Attends meetings of clubs or organizations: Not on file    Relationship status: Not on file  . Intimate partner violence:    Fear of current or ex partner: Not on file    Emotionally abused: Not on file    Physically abused: Not on file    Forced sexual activity: Not on file  Other Topics Concern  . Not on file  Social History Narrative   Originally from [place], [place]. Previously has traveled to [place], [place], [place], [place], [place], [place], [place].  Previously has been to [place] & [place]. 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 No pets currently. Remote bird exposure. No mold or hot tub exposure.     Family History:   The patient's family history includes Asthma in her mother; Breast cancer in her paternal aunt; CAD  in her mother; Heart attack in her father; Hypertension in her father, maternal grandfather, maternal grandmother, and mother. There is no history of Stroke.    ROS:  Please see the history of present illness.  All other ROS reviewed and negative.     Physical Exam/Data:   Vitals:   05/13/18 1743 05/13/18 1845  BP: (!) 184/80 (!) 178/82  Pulse: 89 78  Resp: 20 18  Temp: 98.1 F (36.7 C)   TempSrc: Oral   SpO2: 98% 91%   No intake or output data in the 24 hours ending 05/13/18 2002 There were no vitals filed for this visit. There is no height or weight on file to calculate BMI.  General:  Well nourished, well developed, in no acute distress HEENT: normal Lymph: no adenopathy Neck: no JVD Endocrine:  No thryomegaly Vascular: No carotid bruits; FA pulses 2+ bilaterally without bruits  Cardiac:  normal S1, S2; RRR; no murmur  Lungs:  clear to auscultation bilaterally, no wheezing, rhonchi or rales  Abd: soft, nontender, no hepatomegaly  Ext: no edema Musculoskeletal:  No deformities, BUE and BLE strength normal and equal Skin: warm and dry  Neuro:  CNs 2-12 intact, no focal abnormalities noted Psych:  Normal affect    EKG:  The ECG that was done in ED and was personally reviewed and demonstrates sinus rhythm with nonspecific T wave changes, no obvious ST elevation or depression.  Relevant CV Studies:  Cath 05/11/2018  Mid RCA lesion is 100% stenosed. Left to right collaterals present.  Prox LAD lesion is 100% stenosed. LIMA to LAD is patent.  Mid LAD to Dist LAD lesion is 25% stenosed.  SVG to OM was occluded.  Lat 1st Mrg lesion is 95% stenosed.  A drug-eluting  stent was successfully placed using a STENT SYNERGY DES 2.5X16.  Post intervention, there is a 0% residual stenosis.  1st Mrg lesion is 95% stenosed.  A drug-eluting stent was successfully placed using a STENT SYNERGY DES 2.75X16.  Post intervention, there is a 0% residual stenosis.  The left ventricular ejection fraction is 50-55% by visual estimate.  The left ventricular systolic function is normal.  LV end diastolic pressure is normal.  There is no aortic valve stenosis.  Culotte bifurcation stenting of the bifurcation in the OM/lateral OM branch.   Culotte bifurcation stenting of the bifurcation in the OM/lateral OM branch.  Recommend uninterrupted dual antiplatelet therapy with Aspirin 81mg  daily and Clopidogrel 75mg  daily for a minimum of 6 months (stable ischemic heart disease - Class I recommendation).   Continue aggressive secondary prevention.  COnsdier PCI attempt of the RCA if she has refractory angina.  I suspect this can be medically treated.      Laboratory Data:  Chemistry Recent Labs  Lab 05/12/18 0331 05/13/18 1750  NA 140 141  K 4.0 3.3*  CL 112* 106  CO2 21* 26  GLUCOSE 154* 138*  BUN 19 22  CREATININE 0.86 1.09*  CALCIUM 8.7* 9.0  GFRNONAA >60 48*  GFRAA >60 56*  ANIONGAP 7 9    No results for input(s): PROT, ALBUMIN, AST, ALT, ALKPHOS, BILITOT in the last 168 hours. Hematology Recent Labs  Lab 05/12/18 0331 05/13/18 1750  WBC 16.7* 12.1*  RBC 4.16 4.57  HGB 11.5* 13.3  HCT 37.0 41.1  MCV 88.9 89.9  MCH 27.6 29.1  MCHC 31.1 32.4  RDW 13.1 13.2  PLT 282 298   Cardiac EnzymesNo results for input(s): TROPONINI in the last 168 hours.  Recent Labs  Lab 05/13/18 1757  TROPIPOC 2.66*    BNPNo results for input(s): BNP, PROBNP in the last 168 hours.  DDimer No results for input(s): DDIMER in the last 168 hours.  Radiology/Studies:  Dg Chest 2 View  Result Date: 05/13/2018 CLINICAL DATA:  Chest pain. EXAM: CHEST - 2 VIEW  COMPARISON:  Chest x-ray 08/21/2015 FINDINGS: The cardiac silhouette, mediastinal and hilar contours are within normal limits and stable. Stable surgical changes from bypass surgery. The lungs are clear. No infiltrates, edema or effusions. The bony thorax is intact. IMPRESSION: No acute cardiopulmonary findings. Electronically Signed   By: Marijo Sanes M.D.   On: 05/13/2018 19:41    Assessment and Plan:   1. Chest pain with elevated troponin: Interestingly, she never had chest pain prior to the stent placement before.  Her previous symptom was dyspnea on exertion and malaise.  Chest pain only started today and has been intermittent for the past 3 hours.  No obvious exacerbating factors and the symptom occurred at rest.  Initial point-of-care troponin was elevated.  She was only discharged from the hospital yesterday and received aspirin and Plavix before she left, she did take aspirin Plavix this morning as well.  -Admit to cardiology service.  Serial troponin overnight.  Consider prednisone 50 mg every 6 hours for potential cardiac catheterization tomorrow.  2. CAD s/p CABG: DES x2 to OM 2 days ago.  3. Hypertension: Blood pressure elevated in the emergency room in the 170-190s.  Continue home medication with metoprolol and irbesartan.  4. Hyperlipidemia: On simvastatin.    Severity of Illness: The appropriate patient status for this patient is OBSERVATION. Observation status is judged to be reasonable and necessary in order to provide the required intensity of service to ensure the patient's safety. The patient's presenting symptoms, physical exam findings, and initial radiographic and laboratory data in the context of their medical condition is felt to place them at decreased risk for further clinical deterioration. Furthermore, it is anticipated that the patient will be medically stable for discharge from the hospital within 2 midnights of admission. The following factors support the patient  status of observation.   " The patient's presenting symptoms include chest pain. " The physical exam findings include benign. " The initial radiographic and laboratory data are positive troponin.     For questions or updates, please contact [place] Please consult [email or web address] for contact info under        Signed, Almyra Deforest, [place]  05/13/2018 8:02 PM   I have seen and examined the patient along with Almyra Deforest, PA.  I have reviewed the chart, notes and new data.  I agree with PA's note.  Key new complaints: currently asymptomatic, but describes recurrent angina and dyspnea over last couple of days. No urticaria since cath. Key examination changes: seems comfortable right now, lying fully flat in bed. Normal CV exam. Key new findings / data: Troponin 2.66. No changes on ECG.  PLAN: Post procedural NSTEMI, ongoing symptoms. Recommend re-look angiography in AM.  This procedure has been fully reviewed with the patient and written informed consent has been obtained.  Premedicate with steroids for contrast allergy (in fact she got her last dose of steroids less than 36 h ago and is probably still protected). Meanwhile keep on IV heparin.  Sanda Klein, MD, [place] [phone] 05/13/2018, 8:49 PM

## 2018-05-13 NOTE — ED Provider Notes (Addendum)
[facility] EMERGENCY DEPARTMENT Provider Note  CSN: [id number] Arrival date & time: 05/13/18  1737    History   Chief Complaint Chief Complaint  Patient presents with  . Chest Pain    HPI Melissa Elliott is a 77 y.o. female with a medical history of CAD s/p CABG x2 and stents placed 2 days ago who presented to the ED for  Chest Pain   This is a new problem. The current episode started 1 to 2 hours ago. Progression since onset: Waxes and wanes. Associated with: nothing. Pain location: left sided. The pain is moderate. The quality of the pain is described as pressure-like. The pain does not radiate. Associated symptoms include dizziness and malaise/fatigue. Pertinent negatives include no back pain, no cough, no diaphoresis, no irregular heartbeat, no nausea, no near-syncope, no numbness, no palpitations, no shortness of breath, no vomiting and no weakness. She has tried nothing for the symptoms.  Her past medical history is significant for CAD (s/p CABG (in 1997) and stent placement (05/11/18)) and hypertension.  Her family medical history is significant for CAD, hyperlipidemia and hypertension.  Procedure history is positive for cardiac catheterization and stress echo (in 2017).   Past Medical History:  Diagnosis Date  . Asthma   . Complication of anesthesia    "takes a long time to wakeup; sometimes days" (05/11/2018)  . Coronary artery disease   . H/O hiatal hernia   . Heart murmur   . High cholesterol    "on RX because of the bypass" (02/01/2013)  . History of duodenal ulcer   . Hot flashes    "on a regular basis" (05/11/2018)  . Hypertension   . Hypoglycemia    "that's something I have to watch constantly" (02/01/2013)  . Migraine    "probably stopped w/menses" (05/11/2018)  . Pneumonia ? 1970's   "once" (05/11/2018)  . Sciatic nerve pain 12/2012   "RLE; just had it once" (05/11/2018)    Patient Active Problem List   Diagnosis Date Noted  .  Angina pectoris ([place]) 05/11/2018  . Chronic cough 04/22/2016  . Allergic rhinitis 10/09/2015  . Asthma 08/28/2015  . Abnormal nuclear cardiac imaging test 08/28/2015  . Coronary atherosclerosis of native coronary artery 06/20/2013  . Anemia, unspecified 06/20/2013  . Duodenal ulcer 02/02/2013  . Abdominal pain, epigastric 02/01/2013  . Hx of CABG 02/01/2013  . HTN (hypertension) 02/01/2013  . HLD (hyperlipidemia) 02/01/2013  . Osteopenia 02/01/2013    Past Surgical History:  Procedure Laterality Date  . APPENDECTOMY  1970  . BREAST BIOPSY Left    benign  . COLONOSCOPY WITH PROPOFOL N/A 06/19/2015   Procedure: COLONOSCOPY WITH PROPOFOL;  Surgeon: Garlan Fair, MD;  Location: [facility] ENDOSCOPY;  Service: Endoscopy;  Laterality: N/A;  . CORONARY ANGIOPLASTY WITH STENT PLACEMENT  05/11/2018  . CORONARY ANGIOPLASTY WITH STENT PLACEMENT  1997  . CORONARY ARTERY BYPASS GRAFT  ~ 1997   CABG X2  . CORONARY STENT INTERVENTION N/A 05/11/2018   Procedure: CORONARY STENT INTERVENTION;  Surgeon: Jettie Booze, MD;  Location: [facility];  Service: Cardiovascular;  Laterality: N/A;  . ESOPHAGOGASTRODUODENOSCOPY N/A 02/02/2013   Procedure: ESOPHAGOGASTRODUODENOSCOPY (EGD);  Surgeon: Wonda Horner, MD;  Location: [facility] ENDOSCOPY;  Service: Endoscopy;  Laterality: N/A;  . LEFT HEART CATH AND CORS/GRAFTS ANGIOGRAPHY N/A 05/11/2018   Procedure: LEFT HEART CATH AND CORS/GRAFTS ANGIOGRAPHY;  Surgeon: Jettie Booze, MD;  Location: [facility];  Service: Cardiovascular;  Laterality: N/A;  .  SKIN LESION EXCISION Left 1960's   "upper arm; sore that never healed" (02/01/2013)  . TONSILLECTOMY  1940's     OB History   None      Home Medications    Prior to Admission medications   Medication Sig Start Date End Date Taking? Authorizing Provider  ADVAIR DISKUS 250-50 MCG/DOSE AEPB Inhale 1 puff into the lungs 2 (two) times daily. Only uses in winter and spring months 09/13/15    [provider]  albuterol (PROVENTIL HFA;VENTOLIN HFA) 108 (90 Base) MCG/ACT inhaler Inhale 2 puffs into the lungs every 6 (six) hours as needed for wheezing. 04/18/16   Javier Glazier, MD  aspirin (ASPIRIN EC) 81 MG EC tablet Take 81 mg by mouth daily. Swallow whole.    [provider]  Calcium Carbonate-Vitamin D (CALCIUM-D) 600-400 MG-UNIT TABS Take 1 tablet by mouth daily with lunch.    [provider]  cholecalciferol (VITAMIN D) 1000 units tablet Take 1,000 Units by mouth daily with lunch.    [provider]  clopidogrel (PLAVIX) 75 MG tablet Take 1 tablet (75 mg total) by mouth daily with breakfast. 05/12/18   Cheryln Manly, NP  fexofenadine (ALLEGRA) 180 MG tablet Take 180 mg by mouth daily as needed for allergies or rhinitis.    [provider]  irbesartan (AVAPRO) 300 MG tablet Take 1 tablet (300 mg total) by mouth daily. 05/06/18   Jettie Booze, MD  metoprolol succinate (TOPROL-XL) 50 MG 24 hr tablet Take 50 mg by mouth at bedtime.  04/14/14   [provider]  montelukast (SINGULAIR) 10 MG tablet Take 1 tablet (10 mg total) by mouth at bedtime. Patient taking differently: Take 10 mg by mouth at bedtime as needed (allergies).  04/18/16   Javier Glazier, MD  Multiple Vitamins-Minerals (PRESERVISION AREDS 2) CAPS Take 1 capsule by mouth 2 (two) times daily.    [provider]  nitroGLYCERIN (NITROSTAT) 0.4 MG SL tablet Place 0.4 mg under the tongue every 5 (five) minutes as needed for chest pain (UP TO 3 DOSES BEFORE CALLING EMS).    [provider]  simvastatin (ZOCOR) 40 MG tablet Take 40 mg by mouth at bedtime.     [provider]    Family History Family History  Problem Relation Age of Onset  . CAD Mother   . Hypertension Mother   . Asthma Mother   . Heart attack Father   . Hypertension Father   . Hypertension Maternal Grandmother   . Hypertension Maternal Grandfather   . Breast  cancer Paternal Aunt   . Stroke Neg Hx     Social History Social History   Tobacco Use  . Smoking status: Never Smoker  . Smokeless tobacco: Never Used  . Tobacco comment: Exposure through parents.  Substance Use Topics  . Alcohol use: Yes    Alcohol/week: 0.0 standard drinks    Comment: 05/11/2018  "glass of wine maybe once/month"  . Drug use: Never     Allergies   Ciprofloxacin; Sulfa antibiotics; Iodine; Eggs or egg-derived products; and Penicillins   Review of Systems Review of Systems  Constitutional: Positive for fatigue and malaise/fatigue. Negative for diaphoresis.  Respiratory: Positive for chest tightness. Negative for cough and shortness of breath.   Cardiovascular: Positive for chest pain. Negative for palpitations, leg swelling and near-syncope.  Gastrointestinal: Negative for nausea and vomiting.  Musculoskeletal: Negative for back pain.  Neurological: Positive for dizziness and light-headedness. Negative for weakness and numbness.  All other systems reviewed and are negative.  Physical Exam Updated Vital Signs BP (!) 184/80 (BP Location: Right Arm)   Pulse 89   Temp 98.1 F (36.7 C) (Oral)   Resp 20   SpO2 98%   Physical Exam  Constitutional: She appears well-developed and well-nourished.  HENT:  Head: Normocephalic and atraumatic.  Eyes: Pupils are equal, round, and reactive to light. EOM are normal.  Neck: Normal range of motion. Neck supple.  Cardiovascular: Normal rate, regular rhythm, normal heart sounds, intact distal pulses and normal pulses.  No murmur heard. Pulmonary/Chest: Effort normal and breath sounds normal.  Abdominal: Soft. Bowel sounds are normal.  Musculoskeletal: Normal range of motion.       Right lower leg: Normal. She exhibits no edema.       Left lower leg: Normal. She exhibits no edema.  Neurological: She is alert.  Skin: Skin is warm. Capillary refill takes less than 2 seconds. She is not diaphoretic. No pallor.    Nursing note and vitals reviewed.    ED Treatments / Results  Labs (all labs ordered are listed, but only abnormal results are displayed) Labs Reviewed  I-STAT TROPONIN, ED - Abnormal; Notable for the following components:      Result Value   Troponin i, poc 2.66 (*)    All other components within normal limits  BASIC METABOLIC PANEL  CBC    EKG None  Radiology No results found.  Procedures .Critical Care Performed by: Romie Jumper, PA-C Authorized by: Romie Jumper, PA-C   Critical care provider statement:    Critical care time (minutes):  25   Critical care start time:  05/13/2018 6:26 PM   Critical care end time:  05/13/2018 6:49 PM   Critical care was necessary to treat or prevent imminent or life-threatening deterioration of the following conditions: NSTEMI.   Critical care was time spent personally by me on the following activities:  Development of treatment plan with patient or surrogate, discussions with consultants, obtaining history from patient or surrogate, ordering and performing treatments and interventions, ordering and review of laboratory studies, ordering and review of radiographic studies, pulse oximetry and review of old charts   I assumed direction of critical care for this patient from another provider in my specialty: no     (including critical care time)  Medications Ordered in ED Medications - No data to display   Initial Impression / Assessment and Plan / ED Course  Triage vital signs and the nursing notes have been reviewed.  Pertinent labs & imaging results that were available during care of the patient were reviewed and considered in medical decision making (see chart for details).  Patient presents to the ED for chest pain 2 days following cardiac stent placement. Per cardiology note, patient had 100% stenosis of mid-RCA and proximal LAD where stents were placed. She also has a cardiac history that includes CABG x2 in 1997.  She is currently chest pain free, but states that the pain comes and goes. Today she has taken ASA 81mg  and Plavix 75mg .   Clinical Course as of May 13 2336  Thu May 13, 2018  1826 Elevated troponin of 2.66. EKG shows NSR with no ST elevations/depressions or signs of acute ischemia/infarct. Consult placed to cardiology for admission for NSTEMI.   [GM]  1836 Case discussed face-to-face with cardiologist, Dr. Sallyanne Kuster. Patient will be admitted.   [GM]  1907 He has a history of CABG remote and just had 2  stents placed on Tuesday for shortness of breath.  She presents today with some chest discomfort in the left side that is been going on since earlier today.  She has no significant findings on physical exam.  Her EKG is nonspecific but not significantly changed from prior.  Her initial troponin is 2.66 which is elevated.  Cardiology was consulted and they will evaluate the patient in the ED.   [MB]    Clinical Course User Index [GM] Gordon Vandunk, Jonelle Sports, PA-C [MB] Hayden Rasmussen, MD    Final Clinical Impressions(s) / ED Diagnoses  1. NSTEMI. Case discussed with cardiology, Dr. Sallyanne Kuster who will admit patient.  Dispo: Admit.  Final diagnoses:  NSTEMI (non-ST elevated myocardial infarction) [facility])    ED Discharge Orders    None        Junita Push 05/13/18 1854    Junita Push 05/13/18 2337    Hayden Rasmussen, MD 05/14/18 1146

## 2018-05-13 NOTE — Telephone Encounter (Signed)
Spoke with the triage nurses, DOD was done for the day. The patient is having 4-5 chest pain post-stenting on 10/29. She was sitting when the chest pain started. The pain comes and goes. The pain is located on her left side of her chest, upper breast. Advised the patient go to ED to be evaluated. Patient accepted and is going.

## 2018-05-13 NOTE — Telephone Encounter (Signed)
New message   Pt c/o of Chest Pain: STAT if CP now or developed within 24 hours  1. Are you having CP right now? yes  2. Are you experiencing any other symptoms (ex. SOB, nausea, vomiting, sweating)? No   3. How long have you been experiencing CP? Started 30 to 40 minutes ago  4. Is your CP continuous or coming and going? Coming and going   5. Have you taken Nitroglycerin? No  ?

## 2018-05-13 NOTE — ED Notes (Signed)
Pt critical lab result received. Will prioritize rooming.

## 2018-05-14 ENCOUNTER — Encounter (HOSPITAL_COMMUNITY)
Admission: EM | Disposition: A | Discharge status: Home / Self Care | Payer: Self-pay | Source: Home / Self Care | Attending: Emergency Medicine

## 2018-05-14 DIAGNOSIS — I25110 Atherosclerotic heart disease of native coronary artery with unstable angina pectoris: Secondary | ICD-10-CM | POA: Diagnosis not present

## 2018-05-14 HISTORY — PX: LEFT HEART CATH AND CORS/GRAFTS ANGIOGRAPHY: CATH118250

## 2018-05-14 LAB — LIPID PANEL
CHOL/HDL RATIO: 3 ratio
Cholesterol: 157 mg/dL (ref 0–200)
HDL: 52 mg/dL (ref >40)
LDL CALC: 81 mg/dL (ref 0–99)
Triglycerides: 120 mg/dL (ref <150)
VLDL: 24 mg/dL (ref 0–40)

## 2018-05-14 LAB — CBC
HEMATOCRIT: 37.5 % (ref 36.0–46.0)
Hemoglobin: 11.4 g/dL — ABNORMAL LOW (ref 12.0–15.0)
MCH: 27.3 pg (ref 26.0–34.0)
MCHC: 30.4 g/dL (ref 30.0–36.0)
MCV: 89.9 fL (ref 80.0–100.0)
Platelets: 267 10*3/uL (ref 150–400)
RBC: 4.17 MIL/uL (ref 3.87–5.11)
RDW: 13.2 % (ref 11.5–15.5)
WBC: 11.3 10*3/uL — AB (ref 4.0–10.5)
nRBC: 0 % (ref 0.0–0.2)

## 2018-05-14 LAB — BASIC METABOLIC PANEL
Anion gap: 8 (ref 5–15)
BUN: 17 mg/dL (ref 8–23)
CO2: 22 mmol/L (ref 22–32)
Calcium: 8.2 mg/dL — ABNORMAL LOW (ref 8.9–10.3)
Chloride: 109 mmol/L (ref 98–111)
Creatinine, Ser: 0.94 mg/dL (ref 0.44–1.00)
GFR calc Af Amer: >60 mL/min (ref >60)
GFR calc non Af Amer: 57 mL/min — ABNORMAL LOW (ref >60)
GLUCOSE: 200 mg/dL — AB (ref 70–99)
POTASSIUM: 3.8 mmol/L (ref 3.5–5.1)
Sodium: 139 mmol/L (ref 135–145)

## 2018-05-14 LAB — HEPARIN LEVEL (UNFRACTIONATED)
Heparin Unfractionated: 0.46 IU/mL (ref 0.30–0.70)
Heparin Unfractionated: 0.45 IU/mL (ref 0.30–0.70)

## 2018-05-14 LAB — TROPONIN I
TROPONIN I: 1 ng/mL — AB (ref <0.03)
Troponin I: 1.54 ng/mL (ref <0.03)

## 2018-05-14 SURGERY — LEFT HEART CATH AND CORS/GRAFTS ANGIOGRAPHY
Anesthesia: LOCAL

## 2018-05-14 MED ORDER — DIPHENHYDRAMINE HCL 50 MG/ML IJ SOLN
50.0000 mg | Freq: Once | INTRAMUSCULAR | Status: AC
  Administered 2018-05-14: 50 mg via INTRAVENOUS
  Filled 2018-05-14: qty 1

## 2018-05-14 MED ORDER — SODIUM CHLORIDE 0.9% FLUSH: 3.0000 mL | Freq: Two times a day (BID) | INTRAVENOUS | Status: DC

## 2018-05-14 MED ORDER — HEPARIN (PORCINE) IN NACL 1000-0.9 UT/500ML-% IV SOLN
INTRAVENOUS | Status: DC | PRN
  Administered 2018-05-14 (×2): 500 mL

## 2018-05-14 MED ORDER — ASPIRIN 81 MG PO CHEW: 81.0000 mg | CHEWABLE_TABLET | ORAL | Status: DC

## 2018-05-14 MED ORDER — MIDAZOLAM HCL 2 MG/2ML IJ SOLN
INTRAMUSCULAR | Status: DC | PRN
  Administered 2018-05-14: 2 mg via INTRAVENOUS

## 2018-05-14 MED ORDER — SODIUM CHLORIDE 0.9% FLUSH: 3.0000 mL | INTRAVENOUS | Status: DC | PRN

## 2018-05-14 MED ORDER — LIDOCAINE HCL (PF) 1 % IJ SOLN
INTRAMUSCULAR | Status: AC
  Filled 2018-05-14: qty 30

## 2018-05-14 MED ORDER — ACETAMINOPHEN 325 MG PO TABS: 650.0000 mg | ORAL_TABLET | ORAL | Status: DC | PRN

## 2018-05-14 MED ORDER — ONDANSETRON HCL 4 MG/2ML IJ SOLN: 4.0000 mg | Freq: Four times a day (QID) | INTRAMUSCULAR | Status: DC | PRN

## 2018-05-14 MED ORDER — SODIUM CHLORIDE 0.9 % WEIGHT BASED INFUSION: 1.0000 mL/kg/h | INTRAVENOUS | Status: DC

## 2018-05-14 MED ORDER — SODIUM CHLORIDE 0.9 % IV SOLN: 250.0000 mL | INTRAVENOUS | Status: DC | PRN

## 2018-05-14 MED ORDER — SODIUM CHLORIDE 0.9 % WEIGHT BASED INFUSION: 3.0000 mL/kg/h | INTRAVENOUS | Status: DC

## 2018-05-14 MED ORDER — FENTANYL CITRATE (PF) 100 MCG/2ML IJ SOLN
INTRAMUSCULAR | Status: DC | PRN
  Administered 2018-05-14: 25 ug via INTRAVENOUS

## 2018-05-14 MED ORDER — DIPHENHYDRAMINE HCL 25 MG PO CAPS
50.0000 mg | ORAL_CAPSULE | Freq: Once | ORAL | Status: AC
  Filled 2018-05-14: qty 2

## 2018-05-14 MED ORDER — FENTANYL CITRATE (PF) 100 MCG/2ML IJ SOLN
INTRAMUSCULAR | Status: AC
  Filled 2018-05-14: qty 2

## 2018-05-14 MED ORDER — MIDAZOLAM HCL 2 MG/2ML IJ SOLN
INTRAMUSCULAR | Status: AC
  Filled 2018-05-14: qty 2

## 2018-05-14 MED ORDER — IOHEXOL 350 MG/ML SOLN
INTRAVENOUS | Status: DC | PRN
  Administered 2018-05-14: 55 mL via INTRA_ARTERIAL

## 2018-05-14 MED ORDER — SODIUM CHLORIDE 0.9 % WEIGHT BASED INFUSION
1.0000 mL/kg/h | INTRAVENOUS | Status: DC
  Administered 2018-05-14: 1 mL/kg/h via INTRAVENOUS

## 2018-05-14 MED ORDER — SODIUM CHLORIDE 0.9 % IV SOLN: INTRAVENOUS | Status: DC

## 2018-05-14 MED ORDER — LIDOCAINE HCL (PF) 1 % IJ SOLN
INTRAMUSCULAR | Status: DC | PRN
  Administered 2018-05-14: 17 mL

## 2018-05-14 MED ORDER — ASPIRIN 81 MG PO CHEW
81.0000 mg | CHEWABLE_TABLET | ORAL | Status: AC
  Administered 2018-05-14: 81 mg via ORAL
  Filled 2018-05-14: qty 1

## 2018-05-14 MED ORDER — HEPARIN (PORCINE) IN NACL 1000-0.9 UT/500ML-% IV SOLN
INTRAVENOUS | Status: AC
  Filled 2018-05-14: qty 1000

## 2018-05-14 SURGICAL SUPPLY — 10 items
CATH INFINITI MULTIPACK ST 5F (CATHETERS) ×1 IMPLANT
CLOSURE MYNX CONTROL 5F (Vascular Products) ×1 IMPLANT
KIT HEART LEFT (KITS) ×2 IMPLANT
PACK CARDIAC CATHETERIZATION (CUSTOM PROCEDURE TRAY) ×2 IMPLANT
SHEATH PINNACLE 5F 10CM (SHEATH) ×1 IMPLANT
SHEATH PROBE COVER 6X72 (BAG) ×1 IMPLANT
TRANSDUCER W/STOPCOCK (MISCELLANEOUS) ×2 IMPLANT
TUBING CIL FLEX 10 FLL-RA (TUBING) ×2 IMPLANT
WIRE EMERALD 3MM-J .035X150CM (WIRE) ×1 IMPLANT
WIRE HI TORQ VERSACORE-J 145CM (WIRE) ×1 IMPLANT

## 2018-05-14 NOTE — Progress Notes (Signed)
ANTICOAGULATION CONSULT NOTE - Initial Consult  Pharmacy Consult for heparin Indication: chest pain/ACS  Allergies  Allergen Reactions  . Ciprofloxacin Nausea And Vomiting    Severe "dry heaves"  . Sulfa Antibiotics Swelling    Swelling in face, neck, arms, hands.   . Iodine Hives  . Eggs Or Egg-Derived Products Nausea And Vomiting    "sometimes I can eat eggs; sometimes I can't"  . Penicillins     As a child Has patient had a PCN reaction causing immediate rash, facial/tongue/throat swelling, SOB or lightheadedness with hypotension: Unknown Has patient had a PCN reaction causing severe rash involving mucus membranes or skin necrosis: Unknown Has patient had a PCN reaction that required hospitalization: Unknown Has patient had a PCN reaction occurring within the last 10 years: Unknown If all of the above answers are "NO", then may proceed with Cephalosporin use.     Patient Measurements: Weight: 130 lb 8.2 oz (59.2 kg) Heparin Dosing Weight: 60 kg  Vital Signs: BP: 174/88 (11/01 0939) Pulse Rate: 68 (11/01 0939)  Labs: Recent Labs    05/12/18 0331 05/13/18 1750 05/13/18 1857 05/13/18 2153 05/14/18 0151 05/14/18 0202 05/14/18 0800  HGB 11.5* 13.3  --   --  11.4*  --   --   HCT 37.0 41.1  --   --  37.5  --   --   PLT 282 298  --   --  267  --   --   APTT  --   --  28  --   --   --   --   LABPROT  --   --  12.1  --   --   --   --   INR  --   --  0.91  --   --   --   --   HEPARINUNFRC  --   --   --   --   --  0.46 0.45  CREATININE 0.86 1.09*  --   --   --   --   --   TROPONINI  --   --   --  2.33* 1.54*  --   --     Estimated Creatinine Clearance: 37.9 mL/min (A) (by C-G formula based on SCr of 1.09 mg/dL (H)).   Medical History: Past Medical History:  Diagnosis Date  . Asthma   . Complication of anesthesia    "takes a long time to wakeup; sometimes days" (05/11/2018)  . Coronary artery disease   . H/O hiatal hernia   . Heart murmur   . High cholesterol     "on RX because of the bypass" (02/01/2013)  . History of duodenal ulcer   . Hot flashes    "on a regular basis" (05/11/2018)  . Hypertension   . Hypoglycemia    "that's something I have to watch constantly" (02/01/2013)  . Migraine    "probably stopped w/menses" (05/11/2018)  . Pneumonia ? 1970's   "once" (05/11/2018)  . Sciatic nerve pain 12/2012   "RLE; just had it once" (05/11/2018)    Assessment: Pt is a 60 YOF presenting with ACS/chest pain. Pharmacy has been consulted for heparin for ACS/chest pain. No PTA anticoagulation. Pt had coronary stent placed 05/11/18. Plans noted for cath today -heparin level at goal  Goal of Therapy:  Heparin level 0.3-0.7 units/ml Monitor platelets by anticoagulation protocol: Yes   Plan:  -No heparin changes needed -daily heparin level and CBC  Hildred Laser, PharmD Clinical Pharmacist **Pharmacist phone  directory can now be found on [email or web address] ([email or web address]).  Listed under [place].

## 2018-05-14 NOTE — Progress Notes (Signed)
ANTICOAGULATION CONSULT NOTE - Follow Up Consult  Pharmacy Consult for heparin Indication: NSTEMI   Labs: Recent Labs    05/12/18 0331 05/13/18 1750 05/13/18 1857 05/13/18 2153 05/14/18 0151 05/14/18 0202  HGB 11.5* 13.3  --   --  11.4*  --   HCT 37.0 41.1  --   --  37.5  --   PLT 282 298  --   --  267  --   APTT  --   --  28  --   --   --   LABPROT  --   --  12.1  --   --   --   INR  --   --  0.91  --   --   --   HEPARINUNFRC  --   --   --   --   --  0.46  CREATININE 0.86 1.09*  --   --   --   --   TROPONINI  --   --   --  2.33*  --   --     Assessment/Plan:  77yo female therapeutic on heparin with initial dosing for post-procedural NSTEMI. Will continue gtt at current rate and confirm stable with additional level.   Wynona Neat, PharmD, BCPS  05/14/2018,2:57 AM

## 2018-05-14 NOTE — Discharge Summary (Signed)
Discharge Summary    Patient ID: Melissa Elliott MRN: [id number]; DOB: Mar 24, 1941  Admit date: 05/13/2018 Discharge date: 05/14/2018  Primary Care Provider: Wenda Low, MD  Primary Cardiologist: Melissa Grooms, MD   Discharge Diagnoses    Active Problems:   NSTEMI (non-ST elevated myocardial infarction) [facility])  CAD  HLD  Allergies Allergies  Allergen Reactions  . Ciprofloxacin Nausea And Vomiting    Severe "dry heaves"  . Sulfa Antibiotics Swelling    Swelling in face, neck, arms, hands.   . Iodine Hives  . Eggs Or Egg-Derived Products Nausea And Vomiting    "sometimes I can eat eggs; sometimes I can't"  . Penicillins     As a child Has patient had a PCN reaction causing immediate rash, facial/tongue/throat swelling, SOB or lightheadedness with hypotension: Unknown Has patient had a PCN reaction causing severe rash involving mucus membranes or skin necrosis: Unknown Has patient had a PCN reaction that required hospitalization: Unknown Has patient had a PCN reaction occurring within the last 10 years: Unknown If all of the above answers are "NO", then may proceed with Cephalosporin use.     Diagnostic Studies/Procedures    LEFT HEART CATH AND CORS/GRAFTS ANGIOGRAPHY  05/14/18  Conclusion     Prox LAD lesion is 100% stenosed. LIMA to LAD patent.  Mid LAD to Dist LAD lesion is 25% stenosed.  Previously placed 1st Mrg drug eluting stent is widely patent.  Previously placed Lat 1st Mrg drug eluting stent is widely patent.  Mid RCA lesion is 100% stenosed. Collaterals fill the distal vessel.  The left ventricular systolic function is normal.  The left ventricular ejection fraction is 50-55% by visual estimate.  LV end diastolic pressure is normal.  There is no aortic valve stenosis.   Widely patent Culotte stents in the OM territory.  Elevated troponin is trending down and is likely from the complex bifurcation intervention that she had a few days  ago.  Continue aggressive secondary prevention and DAPT as previously prescribed.    Possible discharge later today.    Diagnostic Diagram         History of Present Illness     Ms. Melissa Elliott is a 77 yo female with past medical history of CAD status post CABG x2 in 1996 at [facility], history of stomach ulcer secondary to meloxicam, hypertension, hyperlipidemia and recent DES x 2 to obtuse marginal branch 05/11/18 presented for recurrent pain.   Patient had a previous stress test in 2017 that showed questionable inferior wall defect.    Patient was recently evaluated by Dr. Illene Elliott for dyspnea and malaise.  Her symptom was similar to what she had experienced back in 1998.  Therefore cardiac catheterization was recommended.  She underwent cardiac catheterization on 05/11/2018 which showed a patent LIMA to LAD, occluded SVG to OM.  CTO of mid RCA with distal collaterals.  Patient had a significant bifurcation lesion at obtuse marginal branch treated with drug-eluting stent x2.  Postprocedure, patient was placed on aspirin and Plavix.  RCA was managed medically.  She was discharged on 05/12/2018.  She initially felt well, however by the following day, she started having significant fatigue and intermittent left-sided chest pain the last update 1 to 2 minutes each.  She says she did not have any chest pain prior to the recent cardiac catheterization and if this is something new.  She is eventually sought medical attention at [facility] ED 05/13/18.  EKG does not show  significant ST changes, it does show some nonspecific T wave changes.  Initial point-of-care troponin is 2.66.  According to patient, her chest pain has been intermittent for the past 3 hours.   Chest x-ray does not show any acute issues.    Hospital Course     Consultants: None  Troponin trended down 2.33>>1.54>>1.0. Treated with heparin. Repeat cath showed widely patent Culotte stents in the OM  territory.  Elevated troponin (trending down) is likely from the complex bifurcation intervention that she had a few days ago. Felt stable for discharge. No recurrent chest pain. No change in home meds.   Discharge Vitals Blood pressure (!) 150/67, pulse 81, temperature 97.9 F (36.6 C), temperature source Oral, resp. rate 18, weight 59.2 kg, SpO2 94 %.  Filed Weights   05/14/18 0241  Weight: 59.2 kg    Labs & Radiologic Studies    CBC Recent Labs    05/13/18 1750 05/14/18 0151  WBC 12.1* 11.3*  HGB 13.3 11.4*  HCT 41.1 37.5  MCV 89.9 89.9  PLT 298 244   Basic Metabolic Panel Recent Labs    05/13/18 1750 05/14/18 0804  NA 141 139  K 3.3* 3.8  CL 106 109  CO2 26 22  GLUCOSE 138* 200*  BUN 22 17  CREATININE 1.09* 0.94  CALCIUM 9.0 8.2*   Liver Function Tests No results for input(s): AST, ALT, ALKPHOS, BILITOT, PROT, ALBUMIN in the last 72 hours. No results for input(s): LIPASE, AMYLASE in the last 72 hours. Cardiac Enzymes Recent Labs    05/13/18 2153 05/14/18 0151 05/14/18 0804  TROPONINI 2.33* 1.54* 1.00*   Fasting Lipid Panel Recent Labs    05/14/18 0203  CHOL 157  HDL 52  LDLCALC 81  TRIG 120  CHOLHDL 3.0   Thyroid Function Tests No results for input(s): TSH, T4TOTAL, T3FREE, THYROIDAB in the last 72 hours.  Invalid input(s): FREET3 _____________  Dg Chest 2 View  Result Date: 05/13/2018 CLINICAL DATA:  Chest pain. EXAM: CHEST - 2 VIEW COMPARISON:  Chest x-ray 08/21/2015 FINDINGS: The cardiac silhouette, mediastinal and hilar contours are within normal limits and stable. Stable surgical changes from bypass surgery. The lungs are clear. No infiltrates, edema or effusions. The bony thorax is intact. IMPRESSION: No acute cardiopulmonary findings. Electronically Signed   By: Marijo Sanes M.D.   On: 05/13/2018 19:41   Disposition   Pt is being discharged home today in good condition.  Follow-up Plans & Appointments    Follow-up Information     Daune Perch, NP. Go on 05/20/2018.   Specialty:  Nurse Practitioner Why:  @2 :30pm for hospital follow up  Contact information: [address] [place] [address] [phone]          Discharge Instructions    Diet - Elliott sodium heart healthy   Complete by:  As directed    Discharge instructions   Complete by:  As directed    No driving for 48 hours. No lifting over 5 lbs for 1 week. No sexual activity for 1 week.  Keep procedure site clean & dry. If you notice increased pain, swelling, bleeding or pus, call/return!  You may shower, but no soaking baths/hot tubs/pools for 1 week.   Increase activity slowly   Complete by:  As directed       Discharge Medications   Allergies as of 05/14/2018      Reactions   Ciprofloxacin Nausea And Vomiting   Severe "dry heaves"  Sulfa Antibiotics Swelling   Swelling in face, neck, arms, hands.    Iodine Hives   Eggs Or Egg-derived Products Nausea And Vomiting   "sometimes I can eat eggs; sometimes I can't"   Penicillins    As a child Has patient had a PCN reaction causing immediate rash, facial/tongue/throat swelling, SOB or lightheadedness with hypotension: Unknown Has patient had a PCN reaction causing severe rash involving mucus membranes or skin necrosis: Unknown Has patient had a PCN reaction that required hospitalization: Unknown Has patient had a PCN reaction occurring within the last 10 years: Unknown If all of the above answers are "NO", then may proceed with Cephalosporin use.      Medication List    TAKE these medications   ADVAIR DISKUS 250-50 MCG/DOSE Aepb Generic drug:  Fluticasone-Salmeterol Inhale 1 puff into the lungs 2 (two) times daily. Only uses in winter and spring months   albuterol 108 (90 Base) MCG/ACT inhaler Commonly known as:  PROVENTIL HFA;VENTOLIN HFA Inhale 2 puffs into the lungs every 6 (six) hours as needed for wheezing.   aspirin EC 81 MG EC tablet Generic drug:  aspirin Take 81 mg  by mouth daily. Swallow whole.   Calcium-D 600-400 MG-UNIT Tabs Take 1 tablet by mouth daily with lunch.   cholecalciferol 1000 units tablet Commonly known as:  VITAMIN D Take 1,000 Units by mouth daily with lunch.   clopidogrel 75 MG tablet Commonly known as:  PLAVIX Take 1 tablet (75 mg total) by mouth daily with breakfast.   fexofenadine 180 MG tablet Commonly known as:  ALLEGRA Take 180 mg by mouth daily as needed for allergies or rhinitis.   irbesartan 300 MG tablet Commonly known as:  AVAPRO Take 1 tablet (300 mg total) by mouth daily.   metoprolol succinate 50 MG 24 hr tablet Commonly known as:  TOPROL-XL Take 50 mg by mouth at bedtime.   montelukast 10 MG tablet Commonly known as:  SINGULAIR Take 1 tablet (10 mg total) by mouth at bedtime. What changed:    when to take this  reasons to take this   nitroGLYCERIN 0.4 MG SL tablet Commonly known as:  NITROSTAT Place 0.4 mg under the tongue every 5 (five) minutes as needed for chest pain (UP TO 3 DOSES BEFORE CALLING EMS).   PRESERVISION AREDS 2 Caps Take 1 capsule by mouth 2 (two) times daily.   simvastatin 40 MG tablet Commonly known as:  ZOCOR Take 40 mg by mouth at bedtime.        Acute coronary syndrome (MI, NSTEMI, STEMI, etc) this admission?: No.    Outstanding Labs/Studies   None  Duration of Discharge Encounter   Greater than 30 minutes including physician time.  Jarrett Soho, PA 05/14/2018, 1:58 PM

## 2018-05-14 NOTE — H&P (View-Only) (Signed)
Pt seen in the ED this am. No chest pain this am. Troponin is trending down. Case reviewed with Dr. Irish Lack. Will continue IV heparin for now. Relook cath later this am. Labs reviewed and ok for cath.   Lauree Chandler 05/14/2018 7:59 AM

## 2018-05-14 NOTE — Progress Notes (Signed)
Pt seen in the ED this am. No chest pain this am. Troponin is trending down. Case reviewed with Dr. Irish Lack. Will continue IV heparin for now. Relook cath later this am. Labs reviewed and ok for cath.   Melissa Elliott 05/14/2018 7:59 AM

## 2018-05-14 NOTE — Interval H&P Note (Signed)
Cath Lab Visit (complete for each Cath Lab visit)  Clinical Evaluation Leading to the Procedure:   ACS: Yes.    Non-ACS:    Anginal Classification: CCS IV  Anti-ischemic medical therapy: Minimal Therapy (1 class of medications)  Non-Invasive Test Results: No non-invasive testing performed  Prior CABG: No previous CABG      History and Physical Interval Note:  05/14/2018 10:52 AM  Melissa Elliott  has presented today for surgery, with the diagnosis of unstable angina  The various methods of treatment have been discussed with the patient and family. After consideration of risks, benefits and other options for treatment, the patient has consented to  Procedure(s): LEFT HEART CATH AND CORS/GRAFTS ANGIOGRAPHY (N/A) as a surgical intervention .  The patient's history has been reviewed, patient examined, no change in status, stable for surgery.  I have reviewed the patient's chart and labs.  Questions were answered to the patient's satisfaction.     Larae Grooms

## 2018-05-14 NOTE — Discharge Instructions (Signed)
Drink plenty of fluids over next 48 hours ° °Femoral Site Care °Refer to this sheet in the next few weeks. These instructions provide you with information about caring for yourself after your procedure. Your health care provider may also give you more specific instructions. Your treatment has been planned according to current medical practices, but problems sometimes occur. Call your health care provider if you have any problems or questions after your procedure. °What can I expect after the procedure? °After your procedure, it is typical to have the following: °· Bruising at the site that usually fades within 1-2 weeks. °· Blood collecting in the tissue (hematoma) that may be painful to the touch. It should usually decrease in size and tenderness within 1-2 weeks. ° °Follow these instructions at home: °· Take medicines only as directed by your health care provider. °· You may shower 24-48 hours after the procedure or as directed by your health care provider. Remove the bandage (dressing) and gently wash the site with plain soap and water. Pat the area dry with a clean towel. Do not rub the site, because this may cause bleeding. °· Do not take baths, swim, or use a hot tub until your health care provider approves. °· Check your insertion site every day for redness, swelling, or drainage. °· Do not apply powder or lotion to the site. °· Limit use of stairs to twice a day for the first 2-3 days or as directed by your health care provider. °· Do not squat for the first 2-3 days or as directed by your health care provider. °· Do not lift over 10 lb (4.5 kg) for 5 days after your procedure or as directed by your health care provider. °· Ask your health care provider when it is okay to: °? Return to work or school. °? Resume usual physical activities or sports. °? Resume sexual activity. °· Do not drive home if you are discharged the same day as the procedure. Have someone else drive you. °· You may drive 24 hours after  the procedure unless otherwise instructed by your health care provider. °· Do not operate machinery or power tools for 24 hours after the procedure or as directed by your health care provider. °· If your procedure was done as an outpatient procedure, which means that you went home the same day as your procedure, a responsible adult should be with you for the first 24 hours after you arrive home. °· Keep all follow-up visits as directed by your health care provider. This is important. °Contact a health care provider if: °· You have a fever. °· You have chills. °· You have increased bleeding from the site. Hold pressure on the site. °Get help right away if: °· You have unusual pain at the site. °· You have redness, warmth, or swelling at the site. °· You have drainage (other than a small amount of blood on the dressing) from the site. °· The site is bleeding, and the bleeding does not stop after 30 minutes of holding steady pressure on the site. °· Your leg or foot becomes pale, cool, tingly, or numb. °This information is not intended to replace advice given to you by your health care provider. Make sure you discuss any questions you have with your health care provider. °Document Released: 03/03/2014 Document Revised: 12/06/2015 Document Reviewed: 01/17/2014 °Elsevier Interactive Patient Education © 2018 Elsevier Inc. ° °

## 2018-05-17 ENCOUNTER — Encounter (HOSPITAL_COMMUNITY): Payer: Self-pay | Admitting: Interventional Cardiology

## 2018-05-20 ENCOUNTER — Encounter: Payer: Self-pay | Admitting: Cardiology

## 2018-05-20 ENCOUNTER — Ambulatory Visit: Payer: Medicare HMO | Admitting: Cardiology

## 2018-05-20 VITALS — BP 106/54 | HR 81 | Ht 64.0 in | Wt 128.0 lb

## 2018-05-20 DIAGNOSIS — I25118 Atherosclerotic heart disease of native coronary artery with other forms of angina pectoris: Secondary | ICD-10-CM | POA: Diagnosis not present

## 2018-05-20 DIAGNOSIS — I10 Essential (primary) hypertension: Secondary | ICD-10-CM | POA: Diagnosis not present

## 2018-05-20 DIAGNOSIS — E782 Mixed hyperlipidemia: Secondary | ICD-10-CM

## 2018-05-20 MED ORDER — IRBESARTAN 150 MG PO TABS: 150.0000 mg | ORAL_TABLET | Freq: Every day | ORAL | 3 refills | Status: DC

## 2018-05-20 NOTE — Patient Instructions (Addendum)
Medication Instructions:  DECREASE: Irbesartan to 150 MG daily   If you need a refill on your cardiac medications before your next appointment, please call your pharmacy.   Lab work: None  If you have labs (blood work) drawn today and your tests are completely normal, you will receive your results only by: Marland Kitchen MyChart Message (if you have MyChart) OR . A paper copy in the mail If you have any lab test that is abnormal or we need to change your treatment, we will call you to review the results.  Testing/Procedures: None  Follow-Up: Keep your follow up appointment with Lyda Jester PA-C on 11/21/9 @ 3:00 PM   At [facility], you and your health needs are our priority.  As part of our continuing mission to provide you with exceptional heart care, we have created designated Provider Care Teams.  These Care Teams include your primary Cardiologist (physician) and Advanced Practice Providers (APPs -  Physician Assistants and Nurse Practitioners) who all work together to provide you with the care you need, when you need it. You will need a follow up appointment in 4 months.  Please call our office 2 months in advance to schedule this appointment.  You may see Larae Grooms, MD or one of the following Advanced Practice Providers on your designated Care Team:   [place], PA-C Melina Copa, PA-C . Ermalinda Barrios, PA-C  Any Other Special Instructions Will Be Listed Below (If Applicable).  DASH Eating Plan DASH stands for "Dietary Approaches to Stop Hypertension." The DASH eating plan is a healthy eating plan that has been shown to reduce high blood pressure (hypertension). It may also reduce your risk for type 2 diabetes, heart disease, and stroke. The DASH eating plan may also help with weight loss. What are tips for following this plan? General guidelines  Avoid eating more than 2,300 mg (milligrams) of salt (sodium) a day. If you have hypertension, you may need to reduce your  sodium intake to 1,500 mg a day.  Limit alcohol intake to no more than 1 drink a day for nonpregnant women and 2 drinks a day for men. One drink equals 12 oz of beer, 5 oz of wine, or 1 oz of hard liquor.  Work with your health care provider to maintain a healthy body weight or to lose weight. Ask what an ideal weight is for you.  Get at least 30 minutes of exercise that causes your heart to beat faster (aerobic exercise) most days of the week. Activities may include walking, swimming, or biking.  Work with your health care provider or diet and nutrition specialist (dietitian) to adjust your eating plan to your individual calorie needs. Reading food labels  Check food labels for the amount of sodium per serving. Choose foods with less than 5 percent of the Daily Value of sodium. Generally, foods with less than 300 mg of sodium per serving fit into this eating plan.  To find whole grains, look for the word "whole" as the first word in the ingredient list. Shopping  Buy products labeled as "low-sodium" or "no salt added."  Buy fresh foods. Avoid canned foods and premade or frozen meals. Cooking  Avoid adding salt when cooking. Use salt-free seasonings or herbs instead of table salt or sea salt. Check with your health care provider or pharmacist before using salt substitutes.  Do not fry foods. Cook foods using healthy methods such as baking, boiling, grilling, and broiling instead.  Cook with heart-healthy oils, such  as olive, canola, soybean, or sunflower oil. Meal planning   Eat a balanced diet that includes: ? 5 or more servings of fruits and vegetables each day. At each meal, try to fill half of your plate with fruits and vegetables. ? Up to 6-8 servings of whole grains each day. ? Less than 6 oz of lean meat, poultry, or fish each day. A 3-oz serving of meat is about the same size as a deck of cards. One egg equals 1 oz. ? 2 servings of low-fat dairy each day. ? A serving of  nuts, seeds, or beans 5 times each week. ? Heart-healthy fats. Healthy fats called Omega-3 fatty acids are found in foods such as flaxseeds and coldwater fish, like sardines, salmon, and mackerel.  Limit how much you eat of the following: ? Canned or prepackaged foods. ? Food that is high in trans fat, such as fried foods. ? Food that is high in saturated fat, such as fatty meat. ? Sweets, desserts, sugary drinks, and other foods with added sugar. ? Full-fat dairy products.  Do not salt foods before eating.  Try to eat at least 2 vegetarian meals each week.  Eat more home-cooked food and less restaurant, buffet, and fast food.  When eating at a restaurant, ask that your food be prepared with less salt or no salt, if possible. What foods are recommended? The items listed may not be a complete list. Talk with your dietitian about what dietary choices are best for you. Grains Whole-grain or whole-wheat bread. Whole-grain or whole-wheat pasta. Brown rice. [place]. Bulgur. Whole-grain and low-sodium cereals. Pita bread. Low-fat, low-sodium crackers. Whole-wheat flour tortillas. Vegetables Fresh or frozen vegetables (raw, steamed, roasted, or grilled). Low-sodium or reduced-sodium tomato and vegetable juice. Low-sodium or reduced-sodium tomato sauce and tomato paste. Low-sodium or reduced-sodium canned vegetables. Fruits All fresh, dried, or frozen fruit. Canned fruit in natural juice (without added sugar). Meat and other protein foods Skinless chicken or [place]. Ground chicken or [place]. Pork with fat trimmed off. Fish and seafood. Egg whites. Dried beans, peas, or lentils. Unsalted nuts, nut butters, and seeds. Unsalted canned beans. Lean cuts of beef with fat trimmed off. Low-sodium, lean deli meat. Dairy Low-fat (1%) or fat-free (skim) milk. Fat-free, low-fat, or reduced-fat cheeses. Nonfat, low-sodium ricotta or cottage cheese. Low-fat or nonfat yogurt. Low-fat, low-sodium  cheese. Fats and oils Soft margarine without trans fats. Vegetable oil. Low-fat, reduced-fat, or light mayonnaise and salad dressings (reduced-sodium). Canola, safflower, olive, soybean, and sunflower oils. Avocado. Seasoning and other foods Herbs. Spices. Seasoning mixes without salt. Unsalted popcorn and pretzels. Fat-free sweets. What foods are not recommended? The items listed may not be a complete list. Talk with your dietitian about what dietary choices are best for you. Grains Baked goods made with fat, such as croissants, muffins, or some breads. Dry pasta or rice meal packs. Vegetables Creamed or fried vegetables. Vegetables in a cheese sauce. Regular canned vegetables (not low-sodium or reduced-sodium). Regular canned tomato sauce and paste (not low-sodium or reduced-sodium). Regular tomato and vegetable juice (not low-sodium or reduced-sodium). Angie Fava. Olives. Fruits Canned fruit in a light or heavy syrup. Fried fruit. Fruit in cream or butter sauce. Meat and other protein foods Fatty cuts of meat. Ribs. Fried meat. Berniece Salines. Sausage. Bologna and other processed lunch meats. Salami. Fatback. Hotdogs. Bratwurst. Salted nuts and seeds. Canned beans with added salt. Canned or smoked fish. Whole eggs or egg yolks. Chicken or [place] with skin. Dairy Whole or 2% milk, cream,  and half-and-half. Whole or full-fat cream cheese. Whole-fat or sweetened yogurt. Full-fat cheese. Nondairy creamers. Whipped toppings. Processed cheese and cheese spreads. Fats and oils Butter. Stick margarine. Lard. Shortening. Ghee. Bacon fat. Tropical oils, such as coconut, palm kernel, or palm oil. Seasoning and other foods Salted popcorn and pretzels. Onion salt, garlic salt, seasoned salt, table salt, and sea salt. Worcestershire sauce. Tartar sauce. Barbecue sauce. Teriyaki sauce. Soy sauce, including reduced-sodium. Steak sauce. Canned and packaged gravies. Fish sauce. Oyster sauce. Cocktail sauce. Horseradish  that you find on the shelf. Ketchup. Mustard. Meat flavorings and tenderizers. Bouillon cubes. Hot sauce and Tabasco sauce. Premade or packaged marinades. Premade or packaged taco seasonings. Relishes. Regular salad dressings. Where to find more information:  National Heart, Lung, and [place]: https://wilson-eaton.com/  American Heart Association: www.heart.org Summary  The DASH eating plan is a healthy eating plan that has been shown to reduce high blood pressure (hypertension). It may also reduce your risk for type 2 diabetes, heart disease, and stroke.  With the DASH eating plan, you should limit salt (sodium) intake to 2,300 mg a day. If you have hypertension, you may need to reduce your sodium intake to 1,500 mg a day.  When on the DASH eating plan, aim to eat more fresh fruits and vegetables, whole grains, lean proteins, low-fat dairy, and heart-healthy fats.  Work with your health care provider or diet and nutrition specialist (dietitian) to adjust your eating plan to your individual calorie needs. This information is not intended to replace advice given to you by your health care provider. Make sure you discuss any questions you have with your health care provider. Document Released: 06/19/2011 Document Revised: 06/23/2016 Document Reviewed: 06/23/2016 Elsevier Interactive Patient Education  [employer].

## 2018-05-20 NOTE — Progress Notes (Signed)
Cardiology Office Note:    Date:  05/20/2018   ID:  Melissa Elliott, DOB Sep 29, 1940, MRN [id number]  PCP:  Melissa Low, MD  Cardiologist:  Melissa Grooms, MD  Referring MD: Melissa Low, MD   Chief Complaint  Patient presents with  . Hospitalization Follow-up    Post stent    History of Present Illness:    Melissa Elliott is a 77 y.o. female with a past medical history significant for CAD s/p CABG X2 1996 at [facility] as well as hyperlipidemia, hypertension, hiatal hernia.  She was seen in the office on 05/06/2018 by Dr. Irish Elliott with symptoms concerning for angina with dyspnea and malaise.  She had had a prior abnormal stress test that was being managed medically.  She was arranged to undergo outpatient cardiac catheterization which was done on 05/11/2018 which showed a patent LIMA to LAD, occluded SVG to OM. CTO of mid RCA with distal collaterals. Patient had a significant bifurcation lesion at obtuse marginal branch treated with drug-eluting stent x2. Postprocedure, patient was placed on aspirin and Plavix. RCA was managed medically. She was discharged on 05/12/2018. She initially felt well, however by the following day, she started having significant fatigue and intermittent left-sided chest pain up to 1 to 2 minutes each. She did not have any chest pain prior to the recent cardiac catheterization and this was something new. She presented to [facility] ED 05/13/18. EKG did not show significant ST changes, did show some nonspecific T wave changes. Initial point-of-care troponin is 2.66 and trended down.  Repeat cath showed widely patent culotte stents in the OM territory.  It was felt that the elevated troponin was likely from the complex bifurcation intervention done several days prior.  Melissa Elliott is here today for hospital follow-up with her husband. She had been very sleepy for the first few days post hospital. She attributes  this to the premed for cath twice that included Benadryl and she says she is very sensitive to medications. This has improved over the last few days.  She still does not feel up to her usual activity.  She denies chest pain or shortness of breath.  Still occ lightheaded, mostly with rising up but sometimes seated. Orthostatics are positive. She does not feel she is dehydrated.    Past Medical History:  Diagnosis Date  . Asthma   . Complication of anesthesia    "takes a long time to wakeup; sometimes days" (05/11/2018)  . Coronary artery disease   . H/O hiatal hernia   . Heart murmur   . High cholesterol    "on RX because of the bypass" (02/01/2013)  . History of duodenal ulcer   . Hot flashes    "on a regular basis" (05/11/2018)  . Hypertension   . Hypoglycemia    "that's something I have to watch constantly" (02/01/2013)  . Migraine    "probably stopped w/menses" (05/11/2018)  . Pneumonia ? 1970's   "once" (05/11/2018)  . Sciatic nerve pain 12/2012   "RLE; just had it once" (05/11/2018)    Past Surgical History:  Procedure Laterality Date  . APPENDECTOMY  1970  . BREAST BIOPSY Left    benign  . COLONOSCOPY WITH PROPOFOL N/A 06/19/2015   Procedure: COLONOSCOPY WITH PROPOFOL;  Surgeon: Melissa Fair, MD;  Location: [facility] ENDOSCOPY;  Service: Endoscopy;  Laterality: N/A;  . CORONARY ANGIOPLASTY WITH STENT PLACEMENT  05/11/2018  . CORONARY ANGIOPLASTY WITH STENT PLACEMENT  1997  .  CORONARY ARTERY BYPASS GRAFT  ~ 1997   CABG X2  . CORONARY STENT INTERVENTION N/A 05/11/2018   Procedure: CORONARY STENT INTERVENTION;  Surgeon: Melissa Booze, MD;  Location: [facility];  Service: Cardiovascular;  Laterality: N/A;  . ESOPHAGOGASTRODUODENOSCOPY N/A 02/02/2013   Procedure: ESOPHAGOGASTRODUODENOSCOPY (EGD);  Surgeon: Melissa Horner, MD;  Location: [facility];  Service: Endoscopy;  Laterality: N/A;  . LEFT HEART CATH AND CORS/GRAFTS ANGIOGRAPHY N/A 05/11/2018   Procedure:  LEFT HEART CATH AND CORS/GRAFTS ANGIOGRAPHY;  Surgeon: Melissa Booze, MD;  Location: [place] [facility];  Service: Cardiovascular;  Laterality: N/A;  . LEFT HEART CATH AND CORS/GRAFTS ANGIOGRAPHY N/A 05/14/2018   Procedure: LEFT HEART CATH AND CORS/GRAFTS ANGIOGRAPHY;  Surgeon: Melissa Booze, MD;  Location: [place] [facility];  Service: Cardiovascular;  Laterality: N/A;  . SKIN LESION EXCISION Left 1960's   "upper arm; sore that never healed" (02/01/2013)  . TONSILLECTOMY  1940's    Current Medications: Current Meds  Medication Sig  . ADVAIR DISKUS 250-50 MCG/DOSE AEPB Inhale 1 puff into the lungs 2 (two) times daily. Only uses in winter and spring months  . albuterol (PROVENTIL HFA;VENTOLIN HFA) 108 (90 Base) MCG/ACT inhaler Inhale 2 puffs into the lungs every 6 (six) hours as needed for wheezing.  Marland Kitchen aspirin (ASPIRIN EC) 81 MG EC tablet Take 81 mg by mouth daily. Swallow whole.  . Calcium Carbonate-Vitamin D (CALCIUM-D) 600-400 MG-UNIT TABS Take 1 tablet by mouth daily with lunch.  . cholecalciferol (VITAMIN D) 1000 units tablet Take 1,000 Units by mouth daily with lunch.  . clopidogrel (PLAVIX) 75 MG tablet Take 1 tablet (75 mg total) by mouth daily with breakfast.  . fexofenadine (ALLEGRA) 180 MG tablet Take 180 mg by mouth daily as needed for allergies or rhinitis.  Marland Kitchen irbesartan (AVAPRO) 150 MG tablet Take 1 tablet (150 mg total) by mouth daily.  . metoprolol succinate (TOPROL-XL) 50 MG 24 hr tablet Take 50 mg by mouth at bedtime.   . montelukast (SINGULAIR) 10 MG tablet Take 10 mg by mouth at bedtime.  . Multiple Vitamins-Minerals (PRESERVISION AREDS 2) CAPS Take 1 capsule by mouth 2 (two) times daily.  . nitroGLYCERIN (NITROSTAT) 0.4 MG SL tablet Place 0.4 mg under the tongue every 5 (five) minutes as needed for chest pain (UP TO 3 DOSES BEFORE CALLING EMS).  . simvastatin (ZOCOR) 40 MG tablet Take 40 mg by mouth at bedtime.   . [DISCONTINUED] irbesartan (AVAPRO) 300 MG  tablet Take 1 tablet (300 mg total) by mouth daily.     Allergies:   Ciprofloxacin; Sulfa antibiotics; Iodine; Eggs or egg-derived products; and Penicillins   Social History   Socioeconomic History  . Marital status: Married    Spouse name: Not on file  . Number of children: 3  . Years of education: Not on file  . Highest education level: Not on file  Occupational History  . Occupation: retired  Scientific laboratory technician  . Financial resource strain: Not on file  . Food insecurity:    Worry: Not on file    Inability: Not on file  . Transportation needs:    Medical: Not on file    Non-medical: Not on file  Tobacco Use  . Smoking status: Never Smoker  . Smokeless tobacco: Never Used  . Tobacco comment: Exposure through parents.  Substance and Sexual Activity  . Alcohol use: Yes    Alcohol/week: 0.0 standard drinks    Comment: 05/11/2018  "glass of wine  maybe once/month"  . Drug use: Never  . Sexual activity: Yes  Lifestyle  . Physical activity:    Days per week: Not on file    Minutes per session: Not on file  . Stress: Not on file  Relationships  . Social connections:    Talks on phone: Not on file    Gets together: Not on file    Attends religious service: Not on file    Active member of club or organization: Not on file    Attends meetings of clubs or organizations: Not on file    Relationship status: Not on file  Other Topics Concern  . Not on file  Social History Narrative   Originally from [place], [place]. Previously has traveled to [place], [place], [place], [place], [place], [place], [place]. Previously has been to [place] & [place]. 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 No pets currently. Remote bird exposure. No mold or hot tub exposure.      Family History: The patient's family history includes Asthma in her mother; Breast cancer in her paternal aunt; CAD in her mother; Heart attack in her father; Hypertension in her father, maternal grandfather,  maternal grandmother, and mother. There is no history of Stroke. ROS:   Please see the history of present illness.     All other systems reviewed and are negative.  EKGs/Labs/Other Studies Reviewed:    The following studies were reviewed today:  CORONARY STENT INTERVENTION  LEFT HEART CATH AND CORS/GRAFTS ANGIOGRAPHY 05/11/18  Conclusion    Mid RCA lesion is 100% stenosed. Left to right collaterals present.  Prox LAD lesion is 100% stenosed. LIMA to LAD is patent.  Mid LAD to Dist LAD lesion is 25% stenosed.  SVG to OM was occluded.  Lat 1st Mrg lesion is 95% stenosed.  A drug-eluting stent was successfully placed using a STENT SYNERGY DES 2.5X16.  Post intervention, there is a 0% residual stenosis.  1st Mrg lesion is 95% stenosed.  A drug-eluting stent was successfully placed using a STENT SYNERGY DES 2.75X16.  Post intervention, there is a 0% residual stenosis.  The left ventricular ejection fraction is 50-55% by visual estimate.  The left ventricular systolic function is normal.  LV end diastolic pressure is normal.  There is no aortic valve stenosis.  Culotte bifurcation stenting of the bifurcation in the OM/lateral OM branch.   Culotte bifurcation stenting of the bifurcation in the OM/lateral OM branch.  Recommend uninterrupted dual antiplatelet therapy with Aspirin 81mg  daily and Clopidogrel 75mg  daily for a minimum of 6 months (stable ischemic heart disease - Class I recommendation).   Continue aggressive secondary prevention.  COnsdier PCI attempt of the RCA if she has refractory angina.  I suspect this can be medically treated.     EKG:  EKG is ordered today.    Recent Labs: 05/06/2018: ALT 12 05/14/2018: BUN 17; Creatinine, Ser 0.94; Hemoglobin 11.4; Platelets 267; Potassium 3.8; Sodium 139   Recent Lipid Panel    Component Value Date/Time   CHOL 157 05/14/2018 0203   TRIG 120 05/14/2018 0203   HDL 52 05/14/2018 0203   CHOLHDL 3.0 05/14/2018  0203   VLDL 24 05/14/2018 0203   LDLCALC 81 05/14/2018 0203    Physical Exam:    VS:  BP (!) 106/54   Pulse 81   Ht 5\' 4"  (1.626 m)   Wt 128 lb (58.1 kg)   SpO2 98%   BMI 21.97 kg/m  Wt Readings from Last 3 Encounters:  05/20/18 128 lb (58.1 kg)  05/14/18 130 lb 8.2 oz (59.2 kg)  05/12/18 133 lb 6.1 oz (60.5 kg)    Orthostatic VS for the past 24 hrs (Last 3 readings):  BP- Lying Pulse- Lying BP- Sitting Pulse- Sitting BP- Standing at 0 minutes Pulse- Standing at 0 minutes BP- Standing at 3 minutes Pulse- Standing at 3 minutes  05/20/18 1531 120/60 81 110/58 81 106/56 90 112/56 91   Physical Exam  Constitutional: She is oriented to person, place, and time. She appears well-developed and well-nourished. No distress.  HENT:  Head: Normocephalic and atraumatic.  Neck: Normal range of motion. Neck supple. No JVD present.  Cardiovascular: Normal rate, regular rhythm, normal heart sounds and intact distal pulses. Exam reveals no gallop and no friction rub.  No murmur heard. Pulmonary/Chest: Effort normal and breath sounds normal. No respiratory distress. She has no wheezes. She has no rales.  Abdominal: Soft. Bowel sounds are normal.  Musculoskeletal: Normal range of motion. She exhibits no edema.  Neurological: She is alert and oriented to person, place, and time.  Skin: Skin is warm and dry.  Psychiatric: She has a normal mood and affect. Her behavior is normal. Judgment and thought content normal.  Vitals reviewed.   ASSESSMENT:    1. Atherosclerosis of native coronary artery of native heart with other form of angina pectoris ([place])   2. Mixed hyperlipidemia   3. Essential hypertension    PLAN:    In order of problems listed above:  CAD: S/P CABG 1996 and recent angina symptoms undergoing LHC on 05/11/18 finding significant bifurcation lesion at obtuse marginal branch treated with drug-eluting stent x2. Had elevated troponins post procedure thought to be related to the  complex intervention.  Relook cath showed patent stent. -She continues medical therapy with aspirin, Plavix, beta-blocker, statin -Was very weak and sleepy for several days post hosp. Feeling a little better over the last 2 days. Still occ lightheaded, mostly with rising up but sometimes seated. Orthostatics are positive. She does not feel she is dehydrated. Will decrease ARB. See if she feels better.  -Follow up in 2 weeks.   Hyperlipidemia: On simvastatin 40 mg daily. Goal LDL <70. LDL 81 on 05/14/2018. Usually lower per pt. Wants to work on diet and exercise and get back to her usual routine before we add or switch meds.  -DASH diet. She describes a fairly healthy diet.   Hypertension: On irbesartan 300 mg daily and Toprol 50 mg daily -BP 106/54 -Positive orthostatics. Will decrease Irbesartan as above.  -Pt advised to get a BP monitor and check BP once a day at different times. Discussed target BP <130/80.  -Follow up in 2 weeks as previously arranged.   Medication Adjustments/Labs and Tests Ordered: Current medicines are reviewed at length with the patient today.  Concerns regarding medicines are outlined above. Labs and tests ordered and medication changes are outlined in the patient instructions below:  Patient Instructions  Medication Instructions:  DECREASE: Irbesartan to 150 MG daily   If you need a refill on your cardiac medications before your next appointment, please call your pharmacy.   Lab work: None  If you have labs (blood work) drawn today and your tests are completely normal, you will receive your results only by: Marland Kitchen MyChart Message (if you have MyChart) OR . A paper copy in the mail If you have any lab test that is abnormal or we need to change your  treatment, we will call you to review the results.  Testing/Procedures: None  Follow-Up: Keep your follow up appointment with Lyda Jester PA-C on 11/21/9 @ 3:00 PM   At [facility], you and your health needs  are our priority.  As part of our continuing mission to provide you with exceptional heart care, we have created designated Provider Care Teams.  These Care Teams include your primary Cardiologist (physician) and Advanced Practice Providers (APPs -  Physician Assistants and Nurse Practitioners) who all work together to provide you with the care you need, when you need it. You will need a follow up appointment in 4 months.  Please call our office 2 months in advance to schedule this appointment.  You may see Melissa Grooms, MD or one of the following Advanced Practice Providers on your designated Care Team:   [place], PA-C Melina Copa, PA-C . Ermalinda Barrios, PA-C  Any Other Special Instructions Will Be Listed Below (If Applicable).  DASH Eating Plan DASH stands for "Dietary Approaches to Stop Hypertension." The DASH eating plan is a healthy eating plan that has been shown to reduce high blood pressure (hypertension). It may also reduce your risk for type 2 diabetes, heart disease, and stroke. The DASH eating plan may also help with weight loss. What are tips for following this plan? General guidelines  Avoid eating more than 2,300 mg (milligrams) of salt (sodium) a day. If you have hypertension, you may need to reduce your sodium intake to 1,500 mg a day.  Limit alcohol intake to no more than 1 drink a day for nonpregnant women and 2 drinks a day for men. One drink equals 12 oz of beer, 5 oz of wine, or 1 oz of hard liquor.  Work with your health care provider to maintain a healthy body weight or to lose weight. Ask what an ideal weight is for you.  Get at least 30 minutes of exercise that causes your heart to beat faster (aerobic exercise) most days of the week. Activities may include walking, swimming, or biking.  Work with your health care provider or diet and nutrition specialist (dietitian) to adjust your eating plan to your individual calorie needs. Reading food labels  Check  food labels for the amount of sodium per serving. Choose foods with less than 5 percent of the Daily Value of sodium. Generally, foods with less than 300 mg of sodium per serving fit into this eating plan.  To find whole grains, look for the word "whole" as the first word in the ingredient list. Shopping  Buy products labeled as "Elliott-sodium" or "no salt added."  Buy fresh foods. Avoid canned foods and premade or frozen meals. Cooking  Avoid adding salt when cooking. Use salt-free seasonings or herbs instead of table salt or sea salt. Check with your health care provider or pharmacist before using salt substitutes.  Do not fry foods. Cook foods using healthy methods such as baking, boiling, grilling, and broiling instead.  Cook with heart-healthy oils, such as olive, canola, soybean, or sunflower oil. Meal planning   Eat a balanced diet that includes: ? 5 or more servings of fruits and vegetables each day. At each meal, try to fill half of your plate with fruits and vegetables. ? Up to 6-8 servings of whole grains each day. ? Less than 6 oz of lean meat, poultry, or fish each day. A 3-oz serving of meat is about the same size as a deck of cards. One egg equals 1  oz. ? 2 servings of Elliott-fat dairy each day. ? A serving of nuts, seeds, or beans 5 times each week. ? Heart-healthy fats. Healthy fats called Omega-3 fatty acids are found in foods such as flaxseeds and coldwater fish, like sardines, salmon, and mackerel.  Limit how much you eat of the following: ? Canned or prepackaged foods. ? Food that is high in trans fat, such as fried foods. ? Food that is high in saturated fat, such as fatty meat. ? Sweets, desserts, sugary drinks, and other foods with added sugar. ? Full-fat dairy products.  Do not salt foods before eating.  Try to eat at least 2 vegetarian meals each week.  Eat more home-cooked food and less restaurant, buffet, and fast food.  When eating at a restaurant, ask  that your food be prepared with less salt or no salt, if possible. What foods are recommended? The items listed may not be a complete list. Talk with your dietitian about what dietary choices are best for you. Grains Whole-grain or whole-wheat bread. Whole-grain or whole-wheat pasta. Brown rice. [place] Morrow. Bulgur. Whole-grain and Elliott-sodium cereals. Pita bread. Elliott-fat, Elliott-sodium crackers. Whole-wheat flour tortillas. Vegetables Fresh or frozen vegetables (raw, steamed, roasted, or grilled). Elliott-sodium or reduced-sodium tomato and vegetable juice. Elliott-sodium or reduced-sodium tomato sauce and tomato paste. Elliott-sodium or reduced-sodium canned vegetables. Fruits All fresh, dried, or frozen fruit. Canned fruit in natural juice (without added sugar). Meat and other protein foods Skinless chicken or [place]. Ground chicken or [place]. Pork with fat trimmed off. Fish and seafood. Egg whites. Dried beans, peas, or lentils. Unsalted nuts, nut butters, and seeds. Unsalted canned beans. Lean cuts of beef with fat trimmed off. Elliott-sodium, lean deli meat. Dairy Elliott-fat (1%) or fat-free (skim) milk. Fat-free, Elliott-fat, or reduced-fat cheeses. Nonfat, Elliott-sodium ricotta or cottage cheese. Elliott-fat or nonfat yogurt. Elliott-fat, Elliott-sodium cheese. Fats and oils Soft margarine without trans fats. Vegetable oil. Elliott-fat, reduced-fat, or light mayonnaise and salad dressings (reduced-sodium). Canola, safflower, olive, soybean, and sunflower oils. Avocado. Seasoning and other foods Herbs. Spices. Seasoning mixes without salt. Unsalted popcorn and pretzels. Fat-free sweets. What foods are not recommended? The items listed may not be a complete list. Talk with your dietitian about what dietary choices are best for you. Grains Baked goods made with fat, such as croissants, muffins, or some breads. Dry pasta or rice meal packs. Vegetables Creamed or fried vegetables. Vegetables in a cheese sauce. Regular canned  vegetables (not Elliott-sodium or reduced-sodium). Regular canned tomato sauce and paste (not Elliott-sodium or reduced-sodium). Regular tomato and vegetable juice (not Elliott-sodium or reduced-sodium). Angie Fava. Olives. Fruits Canned fruit in a light or heavy syrup. Fried fruit. Fruit in cream or butter sauce. Meat and other protein foods Fatty cuts of meat. Ribs. Fried meat. Berniece Salines. Sausage. Bologna and other processed lunch meats. Salami. Fatback. Hotdogs. Bratwurst. Salted nuts and seeds. Canned beans with added salt. Canned or smoked fish. Whole eggs or egg yolks. Chicken or [place] with skin. Dairy Whole or 2% milk, cream, and half-and-half. Whole or full-fat cream cheese. Whole-fat or sweetened yogurt. Full-fat cheese. Nondairy creamers. Whipped toppings. Processed cheese and cheese spreads. Fats and oils Butter. Stick margarine. Lard. Shortening. Ghee. Bacon fat. Tropical oils, such as coconut, palm kernel, or palm oil. Seasoning and other foods Salted popcorn and pretzels. Onion salt, garlic salt, seasoned salt, table salt, and sea salt. Worcestershire sauce. Tartar sauce. Barbecue sauce. Teriyaki sauce. Soy sauce, including reduced-sodium. Steak sauce. Canned and packaged gravies. Fish sauce. Oyster sauce.  Cocktail sauce. Horseradish that you find on the shelf. Ketchup. Mustard. Meat flavorings and tenderizers. Bouillon cubes. Hot sauce and Tabasco sauce. Premade or packaged marinades. Premade or packaged taco seasonings. Relishes. Regular salad dressings. Where to find more information:  National Heart, Lung, and [place]: https://wilson-eaton.com/  American Heart Association: www.heart.org Summary  The DASH eating plan is a healthy eating plan that has been shown to reduce high blood pressure (hypertension). It may also reduce your risk for type 2 diabetes, heart disease, and stroke.  With the DASH eating plan, you should limit salt (sodium) intake to 2,300 mg a day. If you have hypertension, you  may need to reduce your sodium intake to 1,500 mg a day.  When on the DASH eating plan, aim to eat more fresh fruits and vegetables, whole grains, lean proteins, Elliott-fat dairy, and heart-healthy fats.  Work with your health care provider or diet and nutrition specialist (dietitian) to adjust your eating plan to your individual calorie needs. This information is not intended to replace advice given to you by your health care provider. Make sure you discuss any questions you have with your health care provider. Document Released: 06/19/2011 Document Revised: 06/23/2016 Document Reviewed: 06/23/2016 Elsevier Interactive Patient Education  2018 [place], Melissa Perch, NP  05/20/2018 6:01 PM    [facility]

## 2018-05-24 DIAGNOSIS — I10 Essential (primary) hypertension: Secondary | ICD-10-CM | POA: Diagnosis not present

## 2018-05-24 DIAGNOSIS — Z23 Encounter for immunization: Secondary | ICD-10-CM | POA: Diagnosis not present

## 2018-05-24 DIAGNOSIS — N183 Chronic kidney disease, stage 3 (moderate): Secondary | ICD-10-CM | POA: Diagnosis not present

## 2018-05-24 DIAGNOSIS — R7303 Prediabetes: Secondary | ICD-10-CM | POA: Diagnosis not present

## 2018-05-24 DIAGNOSIS — J4530 Mild persistent asthma, uncomplicated: Secondary | ICD-10-CM | POA: Diagnosis not present

## 2018-05-24 DIAGNOSIS — I25810 Atherosclerosis of coronary artery bypass graft(s) without angina pectoris: Secondary | ICD-10-CM | POA: Diagnosis not present

## 2018-05-24 DIAGNOSIS — Z0000 Encounter for general adult medical examination without abnormal findings: Secondary | ICD-10-CM | POA: Diagnosis not present

## 2018-05-24 DIAGNOSIS — E782 Mixed hyperlipidemia: Secondary | ICD-10-CM | POA: Diagnosis not present

## 2018-05-24 DIAGNOSIS — Z1389 Encounter for screening for other disorder: Secondary | ICD-10-CM | POA: Diagnosis not present

## 2018-06-03 ENCOUNTER — Ambulatory Visit: Payer: Medicare HMO | Admitting: Cardiology

## 2018-06-03 ENCOUNTER — Encounter: Payer: Self-pay | Admitting: Cardiology

## 2018-06-03 VITALS — BP 126/64 | HR 87 | Ht 64.0 in | Wt 129.4 lb

## 2018-06-03 DIAGNOSIS — I2510 Atherosclerotic heart disease of native coronary artery without angina pectoris: Secondary | ICD-10-CM

## 2018-06-03 NOTE — Progress Notes (Signed)
06/03/2018 Melissa Elliott   1941/04/24  [id number]  Primary Physician Wenda Low, MD Primary Cardiologist: Dr. Irish Lack   Reason for Visit/CC: f/u for low BP and CAD   HPI:  Melissa Elliott is a 77 y.o. female who is being seen today for f/u for low BP and recent medication change.   She is a 77 y.o.femalewith CAD. CABGx 2in 1996 @ [facility] with Dr. Roderick Pee. She had several stomach ulcers several years ago; likely secondary to meloxicam. Not taking NSAIDs now.  She was seen in the office on 05/06/2018 by Dr. Irish Lack with symptoms concerning for angina with dyspnea and malaise.  She had had a prior abnormal stress test that was being managed medically.  She was arranged to undergo outpatient cardiac catheterization which was done on 05/11/2018 which showed a patent LIMA to LAD, occluded SVG to OM. CTO of mid RCA with distal collaterals. Patient had a significant bifurcation lesion at obtuse marginal branch treated with drug-eluting stent x2. Postprocedure, patient was placed on aspirin and Plavix. RCA was managed medically. She was discharged on 05/12/2018. She initially felt well, however by the following day, she started having significant fatigue and intermittent left-sided chest pain up to 1 to 2 minutes each. She did not have any chest pain prior to the recent cardiac catheterization and this was something new. She presented to [facility] ED10/31/19.EKG did not show significant ST changes, did show some nonspecific T wave changes. Initial point-of-care troponin is 2.66 and trended down.  Repeat cath showed widely patent culotte stents in the OM territory.  It was felt that the elevated troponin was likely from the complex bifurcation intervention done several days prior.  She was seen back for post hospital f/u on 05/20/18 and noted symptoms of dizziness and hear syncope. Worse with positional changes. Her BP was low, in the low  277A systolic. Orthostatic VS were +. Her irbesartan was reduced down from 300 mg daily to 150 mg daily. She was instructed to monitor BP at home and to return in 2 weeks to reassess symptoms and BP.   She notes significant improvement since her medications have been reduced.  She denies any recurrent symptoms.  She feels back to her normal.  No dizziness, lightheadedness, syncope/near syncope.  She has been monitoring her blood pressures at home and they have been normal.  Her blood pressure today in clinic is 126/64.  She denies any chest pain or dyspnea.  Fully compliant with medications.  Cardiac Studies  Procedures   CORONARY STENT INTERVENTION  LEFT HEART CATH AND CORS/GRAFTS ANGIOGRAPHY  Conclusion     Mid RCA lesion is 100% stenosed. Left to right collaterals present.  Prox LAD lesion is 100% stenosed. LIMA to LAD is patent.  Mid LAD to Dist LAD lesion is 25% stenosed.  SVG to OM was occluded.  Lat 1st Mrg lesion is 95% stenosed.  A drug-eluting stent was successfully placed using a STENT SYNERGY DES 2.5X16.  Post intervention, there is a 0% residual stenosis.  1st Mrg lesion is 95% stenosed.  A drug-eluting stent was successfully placed using a STENT SYNERGY DES 2.75X16.  Post intervention, there is a 0% residual stenosis.  The left ventricular ejection fraction is 50-55% by visual estimate.  The left ventricular systolic function is normal.  LV end diastolic pressure is normal.  There is no aortic valve stenosis.  Culotte bifurcation stenting of the bifurcation in the OM/lateral OM branch.  Culotte bifurcation stenting of the bifurcation in the OM/lateral OM branch.  Recommend uninterrupted dual antiplatelet therapy with Aspirin 81mg  daily and Clopidogrel 75mg  daily for a minimum of 6 months (stable ischemic heart disease - Class I recommendation).   Continue aggressive secondary prevention.  COnsdier PCI attempt of the RCA if she has refractory angina.   I suspect this can be medically treated.      Current Meds  Medication Sig  . ADVAIR DISKUS 250-50 MCG/DOSE AEPB Inhale 1 puff into the lungs 2 (two) times daily. Only uses in winter and spring months  . albuterol (PROVENTIL HFA;VENTOLIN HFA) 108 (90 Base) MCG/ACT inhaler Inhale 2 puffs into the lungs every 6 (six) hours as needed for wheezing.  Marland Kitchen aspirin (ASPIRIN EC) 81 MG EC tablet Take 81 mg by mouth daily. Swallow whole.  . Calcium Carbonate-Vitamin D (CALCIUM-D) 600-400 MG-UNIT TABS Take 1 tablet by mouth daily with lunch.  . cholecalciferol (VITAMIN D) 1000 units tablet Take 1,000 Units by mouth daily with lunch.  . clopidogrel (PLAVIX) 75 MG tablet Take 1 tablet (75 mg total) by mouth daily with breakfast.  . fexofenadine (ALLEGRA) 180 MG tablet Take 180 mg by mouth daily as needed for allergies or rhinitis.  Marland Kitchen irbesartan (AVAPRO) 150 MG tablet Take 1 tablet (150 mg total) by mouth daily.  . metFORMIN (GLUCOPHAGE) 500 MG tablet Take 500 mg by mouth daily. with food  . metoprolol succinate (TOPROL-XL) 50 MG 24 hr tablet Take 50 mg by mouth at bedtime.   . montelukast (SINGULAIR) 10 MG tablet Take 10 mg by mouth at bedtime.  . Multiple Vitamins-Minerals (PRESERVISION AREDS 2) CAPS Take 1 capsule by mouth 2 (two) times daily.  . nitroGLYCERIN (NITROSTAT) 0.4 MG SL tablet Place 0.4 mg under the tongue every 5 (five) minutes as needed for chest pain (UP TO 3 DOSES BEFORE CALLING EMS).  . simvastatin (ZOCOR) 40 MG tablet Take 40 mg by mouth at bedtime.    Allergies  Allergen Reactions  . Ciprofloxacin Nausea And Vomiting    Severe "dry heaves"  . Sulfa Antibiotics Swelling    Swelling in face, neck, arms, hands.   . Iodine Hives  . Eggs Or Egg-Derived Products Nausea And Vomiting    "sometimes I can eat eggs; sometimes I can't"  . Penicillins     As a child Has patient had a PCN reaction causing immediate rash, facial/tongue/throat swelling, SOB or lightheadedness with hypotension:  Unknown Has patient had a PCN reaction causing severe rash involving mucus membranes or skin necrosis: Unknown Has patient had a PCN reaction that required hospitalization: Unknown Has patient had a PCN reaction occurring within the last 10 years: Unknown If all of the above answers are "NO", then may proceed with Cephalosporin use.    Past Medical History:  Diagnosis Date  . Asthma   . Complication of anesthesia    "takes a long time to wakeup; sometimes days" (05/11/2018)  . Coronary artery disease   . H/O hiatal hernia   . Heart murmur   . High cholesterol    "on RX because of the bypass" (02/01/2013)  . History of duodenal ulcer   . Hot flashes    "on a regular basis" (05/11/2018)  . Hypertension   . Hypoglycemia    "that's something I have to watch constantly" (02/01/2013)  . Migraine    "probably stopped w/menses" (05/11/2018)  . Pneumonia ? 1970's   "once" (05/11/2018)  . Sciatic nerve pain 12/2012   "  RLE; just had it once" (05/11/2018)   Family History  Problem Relation Age of Onset  . CAD Mother   . Hypertension Mother   . Asthma Mother   . Heart attack Father   . Hypertension Father   . Hypertension Maternal Grandmother   . Hypertension Maternal Grandfather   . Breast cancer Paternal Aunt   . Stroke Neg Hx    Past Surgical History:  Procedure Laterality Date  . APPENDECTOMY  1970  . BREAST BIOPSY Left    benign  . COLONOSCOPY WITH PROPOFOL N/A 06/19/2015   Procedure: COLONOSCOPY WITH PROPOFOL;  Surgeon: Garlan Fair, MD;  Location: [facility] END[facility]COPY;  Service: Endoscopy;  Laterality: N/A;  . CORONARY ANGIOPLASTY WITH STENT PLACEMENT  05/11/2018  . CORONARY ANGIOPLASTY WITH STENT PLACEMENT  1997  . CORONARY ARTERY BYPASS GRAFT  ~ 1997   CABG X2  . CORONARY STENT INTERVENTION N/A 05/11/2018   Procedure: CORONARY STENT INTERVENTION;  Surgeon: Jettie Booze, MD;  Location: [place] [facility];  Service: Cardiovascular;  Laterality: N/A;  .  ESOPHAGOGASTRODUODENOSCOPY N/A 02/02/2013   Procedure: ESOPHAGOGASTRODUODENOSCOPY (EGD);  Surgeon: Wonda Horner, MD;  Location: [facility] ENDOSCOPY;  Service: Endoscopy;  Laterality: N/A;  . LEFT HEART CATH AND CORS/GRAFTS ANGIOGRAPHY N/A 05/11/2018   Procedure: LEFT HEART CATH AND CORS/GRAFTS ANGIOGRAPHY;  Surgeon: Jettie Booze, MD;  Location: [place] [facility];  Service: Cardiovascular;  Laterality: N/A;  . LEFT HEART CATH AND CORS/GRAFTS ANGIOGRAPHY N/A 05/14/2018   Procedure: LEFT HEART CATH AND CORS/GRAFTS ANGIOGRAPHY;  Surgeon: Jettie Booze, MD;  Location: [place] [facility];  Service: Cardiovascular;  Laterality: N/A;  . SKIN LESION EXCISION Left 1960's   "upper arm; sore that never healed" (02/01/2013)  . TONSILLECTOMY  1940's   Social History   Socioeconomic History  . Marital status: Married    Spouse name: Not on file  . Number of children: 3  . Years of education: Not on file  . Highest education level: Not on file  Occupational History  . Occupation: retired  Scientific laboratory technician  . Financial resource strain: Not on file  . Food insecurity:    Worry: Not on file    Inability: Not on file  . Transportation needs:    Medical: Not on file    Non-medical: Not on file  Tobacco Use  . Smoking status: Never Smoker  . Smokeless tobacco: Never Used  . Tobacco comment: Exposure through parents.  Substance and Sexual Activity  . Alcohol use: Yes    Alcohol/week: 0.0 standard drinks    Comment: 05/11/2018  "glass of wine maybe once/month"  . Drug use: Never  . Sexual activity: Yes  Lifestyle  . Physical activity:    Days per week: Not on file    Minutes per session: Not on file  . Stress: Not on file  Relationships  . Social connections:    Talks on phone: Not on file    Gets together: Not on file    Attends religious service: Not on file    Active member of club or organization: Not on file    Attends meetings of clubs or organizations: Not on file    Relationship  status: Not on file  . Intimate partner violence:    Fear of current or ex partner: Not on file    Emotionally abused: Not on file    Physically abused: Not on file    Forced sexual activity: Not on file  Other Topics  Concern  . Not on file  Social History Narrative   Originally from [place], [place]. Previously has traveled to [place], [place], [place], [place], [place], [place], [place]. Previously has been to [place] & [place]. 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 No pets currently. Remote bird exposure. No mold or hot tub exposure.      Review of Systems: General: negative for chills, fever, night sweats or weight changes.  Cardiovascular: negative for chest pain, dyspnea on exertion, edema, orthopnea, palpitations, paroxysmal nocturnal dyspnea or shortness of breath Dermatological: negative for rash Respiratory: negative for cough or wheezing Urologic: negative for hematuria Abdominal: negative for nausea, vomiting, diarrhea, bright red blood per rectum, melena, or hematemesis Neurologic: negative for visual changes, syncope, or dizziness All other systems reviewed and are otherwise negative except as noted above.   Physical Exam:  Blood pressure 126/64, pulse 87, height 5\' 4"  (1.626 m), weight 129 lb 6.4 oz (58.7 kg), SpO2 99 %.  General appearance: alert, cooperative and icteric Neck: no carotid bruit and no JVD Lungs: clear to auscultation bilaterally Heart: regular rate and rhythm, S1, S2 normal, no murmur, click, rub or gallop Extremities: extremities normal, atraumatic, no cyanosis or edema Pulses: 2+ and symmetric Skin: Skin color, texture, turgor normal. No rashes or lesions Neurologic: Grossly normal  EKG not performed -- personally reviewed   ASSESSMENT AND PLAN:   1. CAD: Stable since undergoing recent PCI.  Angiographic details outlined above.  She denies any recurrent chest pain or dyspnea.  Blood pressure and heart rate are both well  controlled.  Continue medical therapy.  2. Hypotension: Resolved after recent reduction of her Irbesartan from 300 mg 150 mg daily.  Blood pressure is now normotensive and her symptoms have resolved.  Blood pressure today is 126/64.  We will continue current regimen.  Is   Follow-Up Dr. Irish Lack as planned March 2020.   Melissa Elliott, MHS [facility] 06/03/2018 4:34 PM

## 2018-06-03 NOTE — Patient Instructions (Signed)
Medication Instructions:  NONE If you need a refill on your cardiac medications before your next appointment, please call your pharmacy.   Lab work: NONE If you have labs (blood work) drawn today and your tests are completely normal, you will receive your results only by: Marland Kitchen MyChart Message (if you have MyChart) OR . A paper copy in the mail If you have any lab test that is abnormal or we need to change your treatment, we will call you to review the results.  Testing/Procedures: NONE  Follow-Up: At [facility], you and your health needs are our priority.  As part of our continuing mission to provide you with exceptional heart care, we have created designated Provider Care Teams.  These Care Teams include your primary Cardiologist (physician) and Advanced Practice Providers (APPs -  Physician Assistants and Nurse Practitioners) who all work together to provide you with the care you need, when you need it. .   Any Other Special Instructions Will Be Listed Below (If Applicable).

## 2018-06-08 ENCOUNTER — Ambulatory Visit
Admission: RE | Admit: 2018-06-08 | Discharge: 2018-06-08 | Disposition: A | Discharge status: Home / Self Care | Payer: Medicare HMO | Source: Ambulatory Visit | Attending: Internal Medicine | Admitting: Internal Medicine

## 2018-06-08 DIAGNOSIS — Z1231 Encounter for screening mammogram for malignant neoplasm of breast: Secondary | ICD-10-CM

## 2018-08-05 ENCOUNTER — Encounter

## 2018-08-05 ENCOUNTER — Ambulatory Visit: Payer: Medicare HMO | Admitting: Interventional Cardiology

## 2018-09-21 NOTE — Progress Notes (Signed)
Cardiology Office Note   Date:  09/22/2018   ID:  Melissa Elliott, DOB 1940-11-27, MRN [id number]  PCP:  Wenda Low, MD    No chief complaint on file.  CAD  Wt Readings from Last 3 Encounters:  09/22/18 126 lb 6.4 oz (57.3 kg)  06/03/18 129 lb 6.4 oz (58.7 kg)  05/20/18 128 lb (58.1 kg)       History of Present Illness: Melissa Elliott is a 78 y.o. female  with CAD. 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 She had several stomach ulcers several years ago; likely secondary to meloxicam. Not taking NSAIDs now.  She had outpatient cardiac catheterization which was done on 05/11/2018 which showed a patent LIMA to LAD, occluded SVG to OM. CTO of mid RCA with distal collaterals. Patient had a significant bifurcation lesion at obtuse marginal branch treated with drug-eluting stent x2. Postprocedure, patient was placed on aspirin and Plavix. RCA was managed medically. She was discharged on 05/12/2018. She initially felt well, however by the following day, she started having significant fatigue and intermittent left-sided chest pain up to1 to 2 minutes each. She did not have any chest pain prior to the recent cardiac catheterization and this was something new. [facility].EKG didnot show significant ST changes, didshow some nonspecific T wave changes. Initial point-of-care troponin is 2.66 and trended down.  Repeat cath showed widely patent culotte stents in the OM territory.It was felt that the elevated troponin was likely from the complex bifurcation intervention done several days prior.  She was seen back for post hospital f/u on 05/20/18 and noted symptoms of dizziness and hear syncope. Worse with positional changes. Her BP was low, in the low 751W systolic. Orthostatic VS were +. Her irbesartan was reduced down from 300 mg daily to 150 mg daily. She was instructed to monitor BP at home and to  return in 2 weeks to reassess symptoms and BP.   She notes significant improvement since her medications have been reduced.  Since the last visit, increased walking.  She has lost weight.  Denies : Chest pain. Dizziness. Leg edema. Nitroglycerin use. Orthopnea. Palpitations. Paroxysmal nocturnal dyspnea. Shortness of breath. Syncope.     Past Medical History:  Diagnosis Date  . Asthma   . Complication of anesthesia    "takes a long time to wakeup; sometimes days" (05/11/2018)  . Coronary artery disease   . H/O hiatal hernia   . Heart murmur   . High cholesterol    "on RX because of the bypass" (02/01/2013)  . History of duodenal ulcer   . Hot flashes    "on a regular basis" (05/11/2018)  . Hypertension   . Hypoglycemia    "that's something I have to watch constantly" (02/01/2013)  . Migraine    "probably stopped w/menses" (05/11/2018)  . Pneumonia ? 1970's   "once" (05/11/2018)  . Sciatic nerve pain 12/2012   "RLE; just had it once" (05/11/2018)    Past Surgical History:  Procedure Laterality Date  . APPENDECTOMY  1970  . BREAST BIOPSY Left    benign  . COLONOSCOPY WITH PROPOFOL N/A 06/19/2015   Procedure: COLONOSCOPY WITH PROPOFOL;  Surgeon: Garlan Fair, MD;  Location: [facility] ENDOSCOPY;  Service: Endoscopy;  Laterality: N/A;  . CORONARY ANGIOPLASTY WITH STENT PLACEMENT  05/11/2018  . CORONARY ANGIOPLASTY WITH STENT PLACEMENT  1997  . CORONARY ARTERY BYPASS GRAFT  ~ 1997   CABG X2  .  CORONARY STENT INTERVENTION N/A 05/11/2018   Procedure: CORONARY STENT INTERVENTION;  Surgeon: Jettie Booze, MD;  Location: [place] [facility];  Service: Cardiovascular;  Laterality: N/A;  . ESOPHAGOGASTRODUODENOSCOPY N/A 02/02/2013   Procedure: ESOPHAGOGASTRODUODENOSCOPY (EGD);  Surgeon: Wonda Horner, MD;  Location: [facility] ENDOSCOPY;  Service: Endoscopy;  Laterality: N/A;  . LEFT HEART CATH AND CORS/GRAFTS ANGIOGRAPHY N/A 05/11/2018   Procedure: LEFT HEART CATH AND CORS/GRAFTS  ANGIOGRAPHY;  Surgeon: Jettie Booze, MD;  Location: [place] [facility];  Service: Cardiovascular;  Laterality: N/A;  . LEFT HEART CATH AND CORS/GRAFTS ANGIOGRAPHY N/A 05/14/2018   Procedure: LEFT HEART CATH AND CORS/GRAFTS ANGIOGRAPHY;  Surgeon: Jettie Booze, MD;  Location: [place] [facility];  Service: Cardiovascular;  Laterality: N/A;  . SKIN LESION EXCISION Left 1960's   "upper arm; sore that never healed" (02/01/2013)  . TONSILLECTOMY  1940's     Current Outpatient Medications  Medication Sig Dispense Refill  . ADVAIR DISKUS 250-50 MCG/DOSE AEPB Inhale 1 puff into the lungs 2 (two) times daily. Only uses in winter and spring months  11  . albuterol (PROVENTIL HFA;VENTOLIN HFA) 108 (90 Base) MCG/ACT inhaler Inhale 2 puffs into the lungs every 6 (six) hours as needed for wheezing. 1 Inhaler 3  . aspirin (ASPIRIN EC) 81 MG EC tablet Take 81 mg by mouth daily. Swallow whole.    . Calcium Carbonate-Vitamin D (CALCIUM-D) 600-400 MG-UNIT TABS Take 1 tablet by mouth daily with lunch.    . cholecalciferol (VITAMIN D) 1000 units tablet Take 1,000 Units by mouth daily with lunch.    . clopidogrel (PLAVIX) 75 MG tablet Take 1 tablet (75 mg total) by mouth daily with breakfast. 90 tablet 2  . fexofenadine (ALLEGRA) 180 MG tablet Take 180 mg by mouth daily as needed for allergies or rhinitis.    Marland Kitchen irbesartan (AVAPRO) 150 MG tablet Take 1 tablet (150 mg total) by mouth daily. 90 tablet 3  . metFORMIN (GLUCOPHAGE) 500 MG tablet Take 500 mg by mouth daily. with food  1  . metoprolol succinate (TOPROL-XL) 50 MG 24 hr tablet Take 50 mg by mouth at bedtime.     . montelukast (SINGULAIR) 10 MG tablet Take 10 mg by mouth at bedtime.    . Multiple Vitamins-Minerals (PRESERVISION AREDS 2) CAPS Take 1 capsule by mouth 2 (two) times daily.    . nitroGLYCERIN (NITROSTAT) 0.4 MG SL tablet Place 0.4 mg under the tongue every 5 (five) minutes as needed for chest pain (UP TO 3 DOSES BEFORE CALLING EMS).      . simvastatin (ZOCOR) 40 MG tablet Take 40 mg by mouth at bedtime.      No current facility-administered medications for this visit.     Allergies:   Ciprofloxacin; Sulfa antibiotics; Iodine; Eggs or egg-derived products; and Penicillins    Social History:  The patient  reports that she has never smoked. She has never used smokeless tobacco. She reports current alcohol use. She reports that she does not use drugs.   Family History:  The patient's family history includes Asthma in her mother; Breast cancer in her paternal aunt; CAD in her mother; Heart attack in her father; Hypertension in her father, maternal grandfather, maternal grandmother, and mother.    ROS:  Please see the history of present illness.   Otherwise, review of systems are positive for easy bruising.   All other systems are reviewed and negative.    PHYSICAL EXAM: VS:  BP 138/76   Pulse  98   Ht 5\' 4"  (1.626 m)   Wt 126 lb 6.4 oz (57.3 kg)   SpO2 95%   BMI 21.70 kg/m  , BMI Body mass index is 21.7 kg/m. GEN: Well nourished, well developed, in no acute distress  HEENT: normal  Neck: no JVD, carotid bruits, or masses Cardiac: RRR; no murmurs, rubs, or gallops,no edema  Respiratory:  clear to auscultation bilaterally, normal work of breathing GI: soft, nontender, nondistended, + BS MS: no deformity or atrophy  Skin: warm and dry, no rash Neuro:  Strength and sensation are intact Psych: euthymic mood, full affect    Recent Labs: 05/06/2018: ALT 12 05/14/2018: BUN 17; Creatinine, Ser 0.94; Hemoglobin 11.4; Platelets 267; Potassium 3.8; Sodium 139   Lipid Panel    Component Value Date/Time   CHOL 157 05/14/2018 0203   TRIG 120 05/14/2018 0203   HDL 52 05/14/2018 0203   CHOLHDL 3.0 05/14/2018 0203   VLDL 24 05/14/2018 0203   LDLCALC 81 05/14/2018 0203     Other studies Reviewed: Additional studies/ records that were reviewed today with results demonstrating: labs reviewed.   ASSESSMENT AND  PLAN:  1. CAD: No angina.  Continue aggressive secondary prevention.  She is exercising.  No bleeding problems. LDL 81.  2. Pre-DM: metformin for control.  A1C 6.7.  3. HTN: The current medical regimen is effective;  continue present plan and medications. 4. Continue regular exercise.      Current medicines are reviewed at length with the patient today.  The patient concerns regarding her medicines were addressed.  The following changes have been made:  No change  Labs/ tests ordered today include:  No orders of the defined types were placed in this encounter.   Recommend 150 minutes/week of aerobic exercise Low fat, low carb, high fiber diet recommended  Disposition:   FU in 1 year   Signed, Larae Grooms, MD  09/22/2018 10:46 AM    [facility] [address], [place], [place]  [address] Phone: [phone]; Fax: [phone]

## 2018-09-22 ENCOUNTER — Encounter: Payer: Self-pay | Admitting: Interventional Cardiology

## 2018-09-22 ENCOUNTER — Ambulatory Visit: Payer: Medicare HMO | Admitting: Interventional Cardiology

## 2018-09-22 VITALS — BP 138/76 | HR 98 | Ht 64.0 in | Wt 126.4 lb

## 2018-09-22 DIAGNOSIS — I25118 Atherosclerotic heart disease of native coronary artery with other forms of angina pectoris: Secondary | ICD-10-CM | POA: Diagnosis not present

## 2018-09-22 DIAGNOSIS — I252 Old myocardial infarction: Secondary | ICD-10-CM

## 2018-09-22 DIAGNOSIS — E782 Mixed hyperlipidemia: Secondary | ICD-10-CM

## 2018-09-22 DIAGNOSIS — I10 Essential (primary) hypertension: Secondary | ICD-10-CM | POA: Diagnosis not present

## 2018-09-22 NOTE — Patient Instructions (Signed)

## 2018-11-23 DIAGNOSIS — I10 Essential (primary) hypertension: Secondary | ICD-10-CM | POA: Diagnosis not present

## 2018-11-23 DIAGNOSIS — R7303 Prediabetes: Secondary | ICD-10-CM | POA: Diagnosis not present

## 2018-11-23 DIAGNOSIS — E782 Mixed hyperlipidemia: Secondary | ICD-10-CM | POA: Diagnosis not present

## 2018-11-23 DIAGNOSIS — J4530 Mild persistent asthma, uncomplicated: Secondary | ICD-10-CM | POA: Diagnosis not present

## 2018-11-23 DIAGNOSIS — N183 Chronic kidney disease, stage 3 (moderate): Secondary | ICD-10-CM | POA: Diagnosis not present

## 2018-12-03 DIAGNOSIS — R7303 Prediabetes: Secondary | ICD-10-CM | POA: Diagnosis not present

## 2019-02-01 ENCOUNTER — Other Ambulatory Visit: Payer: Self-pay | Admitting: Cardiology

## 2019-02-15 DIAGNOSIS — H2513 Age-related nuclear cataract, bilateral: Secondary | ICD-10-CM | POA: Diagnosis not present

## 2019-02-15 DIAGNOSIS — H353131 Nonexudative age-related macular degeneration, bilateral, early dry stage: Secondary | ICD-10-CM | POA: Diagnosis not present

## 2019-03-02 DIAGNOSIS — Z0100 Encounter for examination of eyes and vision without abnormal findings: Secondary | ICD-10-CM | POA: Diagnosis not present

## 2019-05-10 IMAGING — MG DIGITAL SCREENING BILATERAL MAMMOGRAM WITH TOMO AND CAD
8 series · 9 of 24 positions shown · non-contrast
Comparison: Previous exam(s).

CLINICAL DATA: Screening.

EXAM:
DIGITAL SCREENING BILATERAL MAMMOGRAM WITH TOMO AND CAD

[R CC synth-2D]
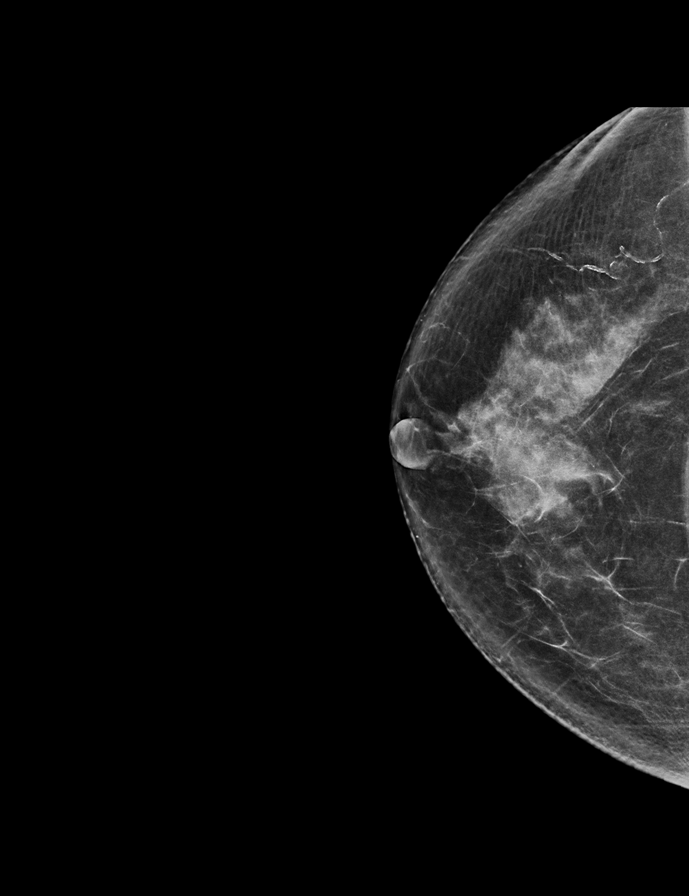

[L CC synth-2D]
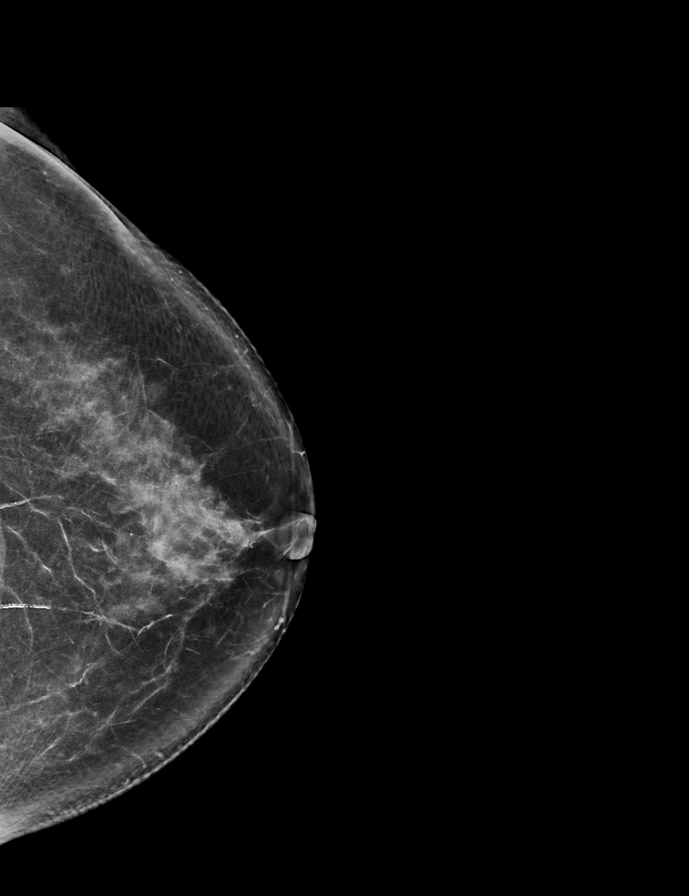

[L MLO synth-2D]
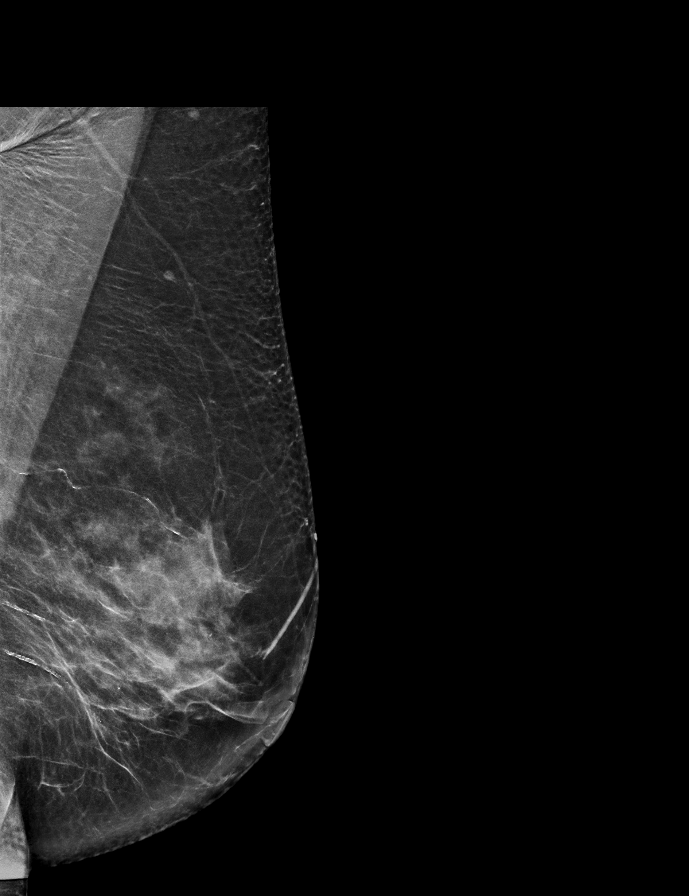

[R MLO synth-2D]
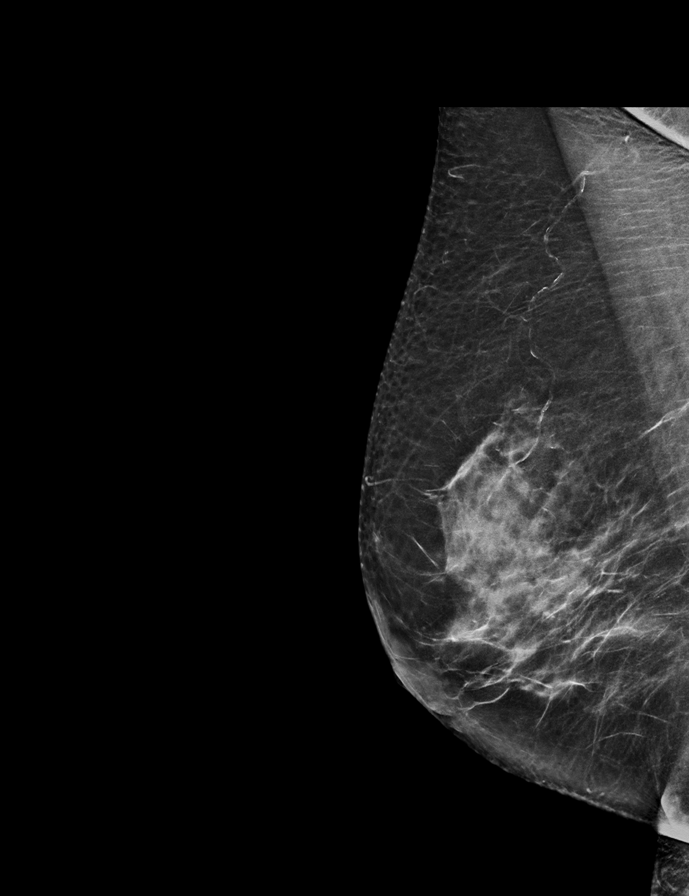

[L MLO tomo · 2 of 71 frames shown]
[frame 23/71]
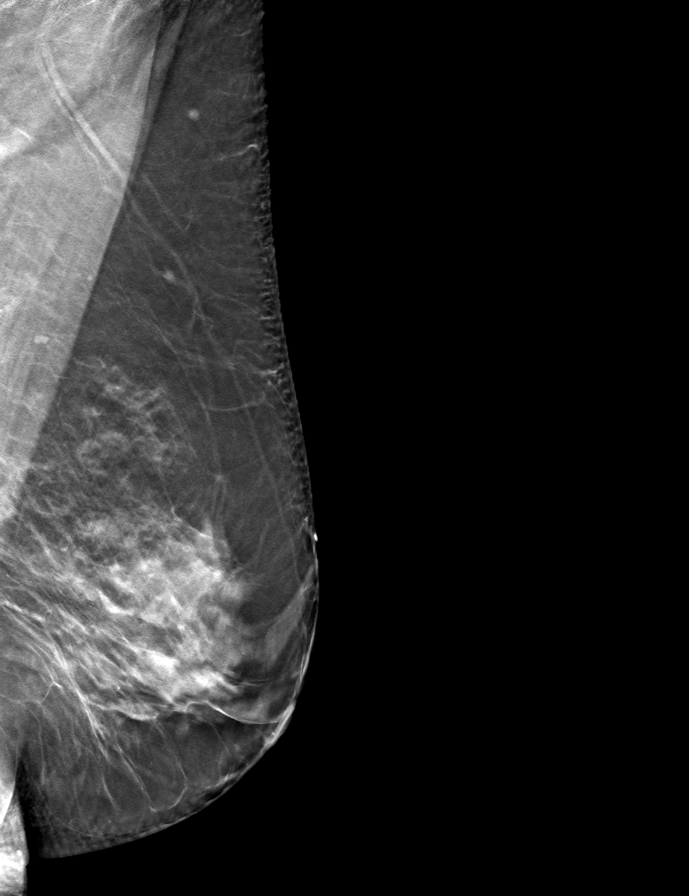
[frame 36/71]
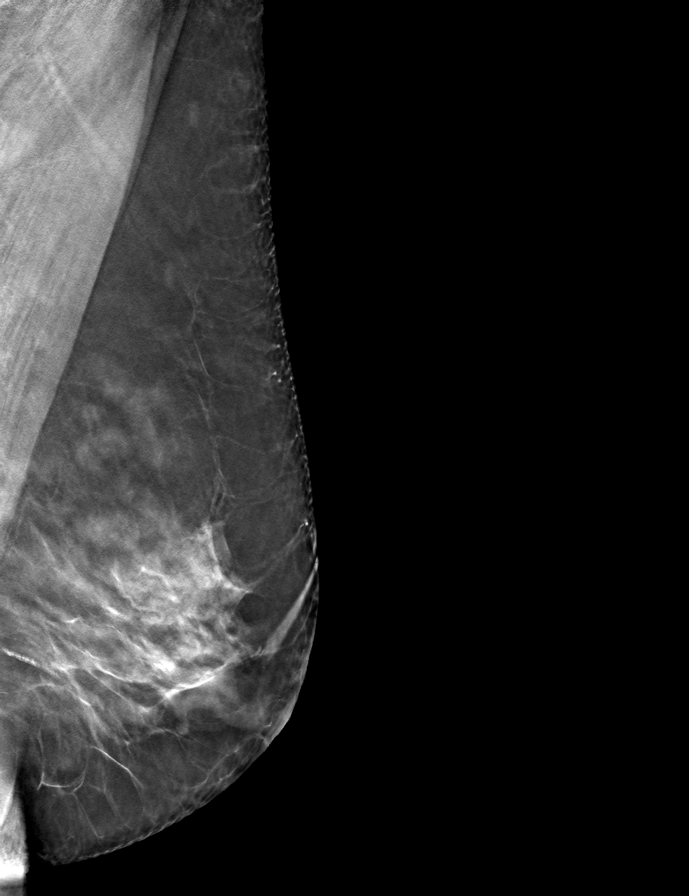

[R CC tomo · tomo slice 38/75.0]
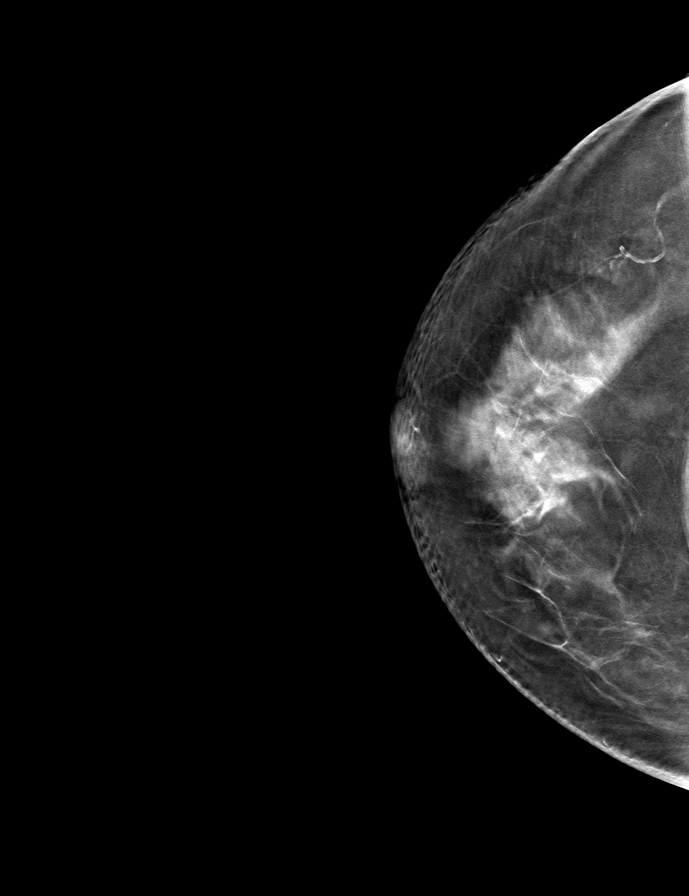

[R MLO tomo · tomo slice 34/67.0]
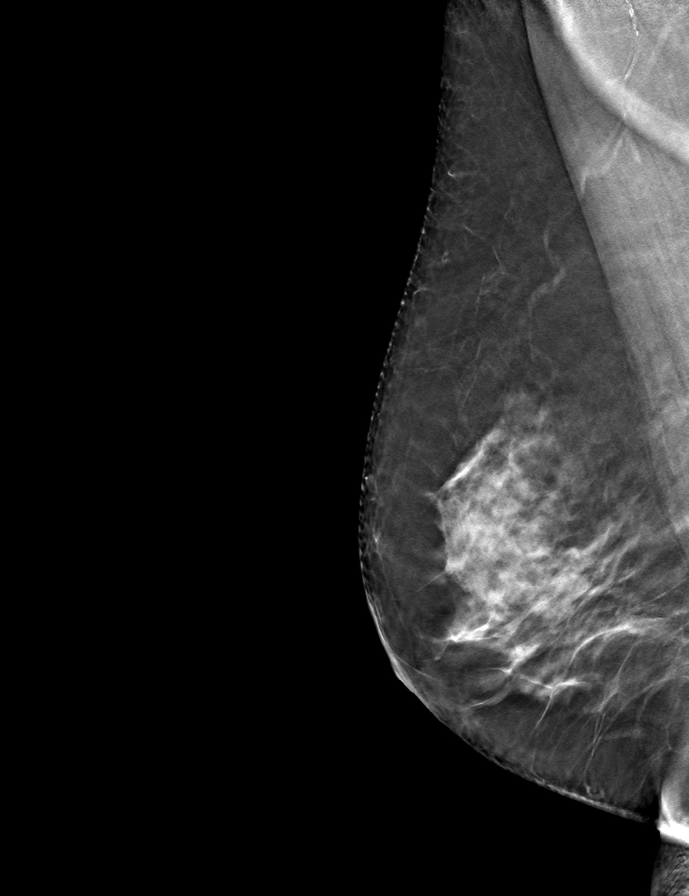

[L CC tomo · tomo slice 39/77.0]
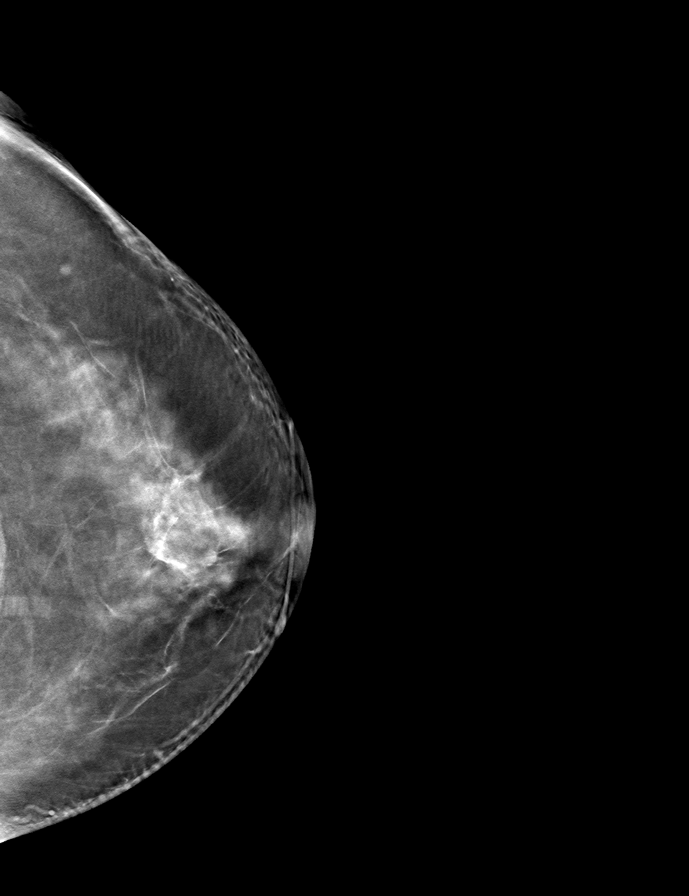

[9 of 24 positions shown; findings below may reference images not displayed]

ACR Breast Density Category c: The breast tissue is heterogeneously
dense, which may obscure small masses.
FINDINGS: There are no findings suspicious for malignancy. Images were
processed with CAD.
IMPRESSION: No mammographic evidence of malignancy. A result letter of this
screening mammogram will be mailed directly to the patient.

RECOMMENDATION:
Screening mammogram in one year. (Code:[id number]-[id number])

BI-RADS CATEGORY  1: Negative.

## 2019-05-16 ENCOUNTER — Other Ambulatory Visit: Payer: Self-pay | Admitting: Internal Medicine

## 2019-05-16 DIAGNOSIS — Z1231 Encounter for screening mammogram for malignant neoplasm of breast: Secondary | ICD-10-CM

## 2019-06-27 DIAGNOSIS — D485 Neoplasm of uncertain behavior of skin: Secondary | ICD-10-CM | POA: Diagnosis not present

## 2019-06-27 DIAGNOSIS — D2272 Melanocytic nevi of left lower limb, including hip: Secondary | ICD-10-CM | POA: Diagnosis not present

## 2019-06-27 DIAGNOSIS — L578 Other skin changes due to chronic exposure to nonionizing radiation: Secondary | ICD-10-CM | POA: Diagnosis not present

## 2019-06-27 DIAGNOSIS — Z86018 Personal history of other benign neoplasm: Secondary | ICD-10-CM | POA: Diagnosis not present

## 2019-06-27 DIAGNOSIS — L821 Other seborrheic keratosis: Secondary | ICD-10-CM | POA: Diagnosis not present

## 2019-07-05 ENCOUNTER — Ambulatory Visit
Admission: RE | Admit: 2019-07-05 | Discharge: 2019-07-05 | Disposition: A | Discharge status: Home / Self Care | Payer: Medicare HMO | Source: Ambulatory Visit | Attending: Internal Medicine | Admitting: Internal Medicine

## 2019-07-05 ENCOUNTER — Other Ambulatory Visit: Payer: Self-pay

## 2019-07-05 DIAGNOSIS — Z1231 Encounter for screening mammogram for malignant neoplasm of breast: Secondary | ICD-10-CM

## 2019-08-05 ENCOUNTER — Other Ambulatory Visit: Payer: Self-pay | Admitting: Interventional Cardiology

## 2019-08-05 MED ORDER — IRBESARTAN 150 MG PO TABS: 150.0000 mg | ORAL_TABLET | Freq: Every day | ORAL | 0 refills | Status: DC

## 2019-08-08 DIAGNOSIS — D2372 Other benign neoplasm of skin of left lower limb, including hip: Secondary | ICD-10-CM | POA: Diagnosis not present

## 2019-08-10 ENCOUNTER — Other Ambulatory Visit: Payer: Self-pay | Admitting: Internal Medicine

## 2019-08-10 DIAGNOSIS — E782 Mixed hyperlipidemia: Secondary | ICD-10-CM | POA: Diagnosis not present

## 2019-08-10 DIAGNOSIS — I2510 Atherosclerotic heart disease of native coronary artery without angina pectoris: Secondary | ICD-10-CM | POA: Diagnosis not present

## 2019-08-10 DIAGNOSIS — M8580 Other specified disorders of bone density and structure, unspecified site: Secondary | ICD-10-CM

## 2019-08-10 DIAGNOSIS — J309 Allergic rhinitis, unspecified: Secondary | ICD-10-CM | POA: Diagnosis not present

## 2019-08-10 DIAGNOSIS — Z1389 Encounter for screening for other disorder: Secondary | ICD-10-CM | POA: Diagnosis not present

## 2019-08-10 DIAGNOSIS — R7309 Other abnormal glucose: Secondary | ICD-10-CM | POA: Diagnosis not present

## 2019-08-10 DIAGNOSIS — J4530 Mild persistent asthma, uncomplicated: Secondary | ICD-10-CM | POA: Diagnosis not present

## 2019-08-10 DIAGNOSIS — I10 Essential (primary) hypertension: Secondary | ICD-10-CM | POA: Diagnosis not present

## 2019-08-10 DIAGNOSIS — Z0000 Encounter for general adult medical examination without abnormal findings: Secondary | ICD-10-CM | POA: Diagnosis not present

## 2019-08-10 DIAGNOSIS — N182 Chronic kidney disease, stage 2 (mild): Secondary | ICD-10-CM | POA: Diagnosis not present

## 2019-08-24 DIAGNOSIS — Z23 Encounter for immunization: Secondary | ICD-10-CM | POA: Diagnosis not present

## 2019-09-14 DIAGNOSIS — Z23 Encounter for immunization: Secondary | ICD-10-CM | POA: Diagnosis not present

## 2019-09-30 NOTE — Progress Notes (Signed)
Cardiology Office Note   Date:  10/03/2019   ID:  Melissa Elliott, DOB [birth year], MRN [id number]  PCP:  Wenda Low, MD    No chief complaint on file.  CAD  Wt Readings from Last 3 Encounters:  10/03/19 119 lb 3.2 oz (54.1 kg)  09/22/18 126 lb 6.4 oz (57.3 kg)  06/03/18 129 lb 6.4 oz (58.7 kg)       History of Present Illness: Melissa Elliott is a 79 y.o. female   with CAD. 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   She had several stomach ulcers several years ago; likely secondary to meloxicam. Not taking NSAIDs now.  She had outpatient cardiac catheterization which was done on 05/11/2018 which showed a patent LIMA to LAD, occluded SVG to OM. CTO of mid RCA with distal collaterals. Patient had a significant bifurcation lesion at obtuse marginal branch treated with drug-eluting stent x2. Postprocedure, patient was placed on aspirin and Plavix. RCA was managed medically. She was discharged on 05/12/2018. She initially felt well, however by the following day, she started having significant fatigue and intermittent left-sided chest pain up to1 to 2 minutes each. She did not have any chest pain prior to the recent cardiac catheterization and this was something new. [facility] didnot show significant ST changes, didshow some nonspecific T wave changes. Initial point-of-care troponin is 2.66 and trended down.  Repeat cath showed widely patent culotte stents in the OM territory.It was felt that the elevated troponin was likely from the complex bifurcation intervention done several days prior.  She was seen back for post hospital f/u on 05/20/18 and noted symptoms of dizziness and hear syncope. Worse with positional changes. Her BP was low, in the low [id number] systolic. Orthostatic VS were +. Her irbesartan was reduced down from 300 mg daily to 150 mg daily. She was instructed to monitor BP at  home and to return in 2 weeks to reassess symptoms and BP.Sx improved.  She had a precancerous lesion removed from the left leg.  Since the last visit, she has had a burning sensation in her left calf with walking. Initially it started after 0.25 mile, now it is at 0.5 miles where it starts.  No nonhealing sores.  Not related to the skin procedure.  Denies : Chest pain. Dizziness. Leg edema. Nitroglycerin use. Orthopnea. Palpitations. Paroxysmal nocturnal dyspnea. Shortness of breath. Syncope.   She continues to use the treadmill.       Past Medical History:  Diagnosis Date  . Asthma   . Complication of anesthesia    "takes a long time to wakeup; sometimes days" (05/11/2018)  . Coronary artery disease   . H/O hiatal hernia   . Heart murmur   . High cholesterol    "on RX because of the bypass" (02/01/2013)  . History of duodenal ulcer   . Hot flashes    "on a regular basis" (05/11/2018)  . Hypertension   . Hypoglycemia    "that's something I have to watch constantly" (02/01/2013)  . Migraine    "probably stopped w/menses" (05/11/2018)  . Pneumonia ? 1970's   "once" (05/11/2018)  . Sciatic nerve pain 12/2012   "RLE; just had it once" (05/11/2018)    Past Surgical History:  Procedure Laterality Date  . APPENDECTOMY  1970  . BREAST BIOPSY Left    benign  . COLONOSCOPY WITH PROPOFOL N/A 06/19/2015   Procedure: COLONOSCOPY WITH PROPOFOL;  Surgeon: Hassell Done  Sandria Senter, MD;  Location: [facility];  Service: Endoscopy;  Laterality: N/A;  . CORONARY ANGIOPLASTY WITH STENT PLACEMENT  05/11/2018  . CORONARY ANGIOPLASTY WITH STENT PLACEMENT  1997  . CORONARY ARTERY BYPASS GRAFT  ~ 1997   CABG X2  . CORONARY STENT INTERVENTION N/A 05/11/2018   Procedure: CORONARY STENT INTERVENTION;  Surgeon: Jettie Booze, MD;  Location: [place] [facility];  Service: Cardiovascular;  Laterality: N/A;  . ESOPHAGOGASTRODUODENOSCOPY N/A 02/02/2013   Procedure: ESOPHAGOGASTRODUODENOSCOPY (EGD);   Surgeon: Wonda Horner, MD;  [facility] END[facility];  Service: Endoscopy;  Laterality: N/A;  . LEFT HEART CATH AND CORS/GRAFTS ANGIOGRAPHY N/A 05/11/2018   Procedure: LEFT HEART CATH AND CORS/GRAFTS ANGIOGRAPHY;  Surgeon: Jettie Booze, MD;  Location: [place] [facility];  Service: Cardiovascular;  Laterality: N/A;  . LEFT HEART CATH AND CORS/GRAFTS ANGIOGRAPHY N/A 05/14/2018   Procedure: LEFT HEART CATH AND CORS/GRAFTS ANGIOGRAPHY;  Surgeon: Jettie Booze, MD;  Location: [place] [facility];  Service: Cardiovascular;  Laterality: N/A;  . SKIN LESION EXCISION Left 1960's   "upper arm; sore that never healed" (02/01/2013)  . TONSILLECTOMY  1940's     Current Outpatient Medications  Medication Sig Dispense Refill  . ADVAIR DISKUS 250-50 MCG/DOSE AEPB Inhale 1 puff into the lungs 2 (two) times daily. Only uses in winter and spring months  11  . albuterol (PROVENTIL HFA;VENTOLIN HFA) 108 (90 Base) MCG/ACT inhaler Inhale 2 puffs into the lungs every 6 (six) hours as needed for wheezing. 1 Inhaler 3  . aspirin (ASPIRIN EC) 81 MG EC tablet Take 81 mg by mouth daily. Swallow whole.    . Calcium Carbonate-Vitamin D (CALCIUM-D) 600-400 MG-UNIT TABS Take 1 tablet by mouth daily with lunch.    . cholecalciferol (VITAMIN D) 1000 units tablet Take 1,000 Units by mouth daily with lunch.    . clopidogrel (PLAVIX) 75 MG tablet TAKE 1 TABLET (75 MG TOTAL) BY MOUTH DAILY WITH BREAKFAST. 90 tablet 2  . fexofenadine (ALLEGRA) 180 MG tablet Take 180 mg by mouth daily as needed for allergies or rhinitis.    Marland Kitchen irbesartan (AVAPRO) 150 MG tablet Take 1 tablet (150 mg total) by mouth daily. Please make yearly appt with Dr. Irish Lack for March before anymore refills. 1st attempt 90 tablet 0  . metFORMIN (GLUCOPHAGE) 500 MG tablet Take 500 mg by mouth daily. with food  1  . metoprolol succinate (TOPROL-XL) 50 MG 24 hr tablet Take 50 mg by mouth at bedtime.     . montelukast (SINGULAIR) 10 MG tablet Take 10 mg by  mouth at bedtime.    . Multiple Vitamins-Minerals (PRESERVISION AREDS 2) CAPS Take 1 capsule by mouth 2 (two) times daily.    . nitroGLYCERIN (NITROSTAT) 0.4 MG SL tablet Place 0.4 mg under the tongue every 5 (five) minutes as needed for chest pain (UP TO 3 DOSES BEFORE CALLING EMS).    . simvastatin (ZOCOR) 40 MG tablet Take 40 mg by mouth at bedtime.      No current facility-administered medications for this visit.    Allergies:   Ciprofloxacin, Sulfa antibiotics, Iodine, Eggs or egg-derived products, and Penicillins    Social History:  The patient  reports that she has never smoked. She has never used smokeless tobacco. She reports current alcohol use. She reports that she does not use drugs.   Family History:  The patient's family history includes Asthma in her mother; Breast cancer in her paternal aunt; CAD in her mother; Heart  attack in her father; Hypertension in her father, maternal grandfather, maternal grandmother, and mother.    ROS:  Please see the history of present illness.   Otherwise, review of systems are positive for increasing exercise.   All other systems are reviewed and negative.    PHYSICAL EXAM: VS:  BP 126/78   Pulse 78   Ht 5\' 4"  (1.626 m)   Wt 119 lb 3.2 oz (54.1 kg)   SpO2 97%   BMI 20.46 kg/m  , BMI Body mass index is 20.46 kg/m. GEN: Well nourished, well developed, in no acute distress  HEENT: normal  Neck: no JVD, carotid bruits, or masses Cardiac: RRR; no murmurs, rubs, or gallops,no edema  Respiratory:  clear to auscultation bilaterally, normal work of breathing GI: soft, nontender, nondistended, + BS MS: no deformity or atrophy  Skin: warm and dry, no rash Neuro:  Strength and sensation are intact Psych: euthymic mood, full affect   EKG:   The ekg ordered today demonstrates NSR, IRBBB, no ST changes   Recent Labs: No results found for requested labs within last 8760 hours.   Lipid Panel    Component Value Date/Time   CHOL 157  05/14/2018 0203   TRIG 120 05/14/2018 0203   HDL 52 05/14/2018 0203   CHOLHDL 3.0 05/14/2018 0203   VLDL 24 05/14/2018 0203   LDLCALC 81 05/14/2018 0203     Other studies Reviewed: Additional studies/ records that were reviewed today with results demonstrating: Jan 2021 labs reviewed from PMD.   ASSESSMENT AND PLAN:  1. CAD/Old MI: Continue aggressive secondary prevention.  Given complex bifurcation stents, will coninue DAPT.  No issues with aspirin in the past.  Did have bleeding with meloxicam.  2. Pre-DM: A1C 5.9 in Jan 2021.  3. HTN: Continue current medicines.  Continue low-salt diet.  Continue regular exercise. 4. Hyperlipidemia: LDL 76, TG 250, HDL 53 5. Left leg claudication: check LE arterial DOppler. She is trying to do a 5K in April.    Current medicines are reviewed at length with the patient today.  The patient concerns regarding her medicines were addressed.  The following changes have been made:  No change  Labs/ tests ordered today include: LE doppler No orders of the defined types were placed in this encounter.   Recommend 150 minutes/week of aerobic exercise Low fat, low carb, high fiber diet recommended  Disposition:   FU in 1 year   Signed, Larae Grooms, MD  10/03/2019 8:37 AM    [facility] [place], [place], [place]  [address] Phone: [phone]; Fax: [phone]

## 2019-10-03 ENCOUNTER — Other Ambulatory Visit: Payer: Self-pay

## 2019-10-03 ENCOUNTER — Ambulatory Visit: Payer: Medicare HMO | Admitting: Interventional Cardiology

## 2019-10-03 ENCOUNTER — Encounter: Payer: Self-pay | Admitting: Interventional Cardiology

## 2019-10-03 VITALS — BP 126/78 | HR 78 | Ht 64.0 in | Wt 119.2 lb

## 2019-10-03 DIAGNOSIS — I739 Peripheral vascular disease, unspecified: Secondary | ICD-10-CM | POA: Diagnosis not present

## 2019-10-03 DIAGNOSIS — E782 Mixed hyperlipidemia: Secondary | ICD-10-CM

## 2019-10-03 DIAGNOSIS — I252 Old myocardial infarction: Secondary | ICD-10-CM | POA: Diagnosis not present

## 2019-10-03 DIAGNOSIS — I10 Essential (primary) hypertension: Secondary | ICD-10-CM | POA: Diagnosis not present

## 2019-10-03 DIAGNOSIS — I25118 Atherosclerotic heart disease of native coronary artery with other forms of angina pectoris: Secondary | ICD-10-CM

## 2019-10-03 DIAGNOSIS — R7303 Prediabetes: Secondary | ICD-10-CM | POA: Diagnosis not present

## 2019-10-03 NOTE — Patient Instructions (Signed)
Medication Instructions:  Your physician recommends that you continue on your current medications as directed. Please refer to the Current Medication list given to you today.  *If you need a refill on your cardiac medications before your next appointment, please call your pharmacy*   Lab Work: None ordered  If you have labs (blood work) drawn today and your tests are completely normal, you will receive your results only by: Marland Kitchen MyChart Message (if you have MyChart) OR . A paper copy in the mail If you have any lab test that is abnormal or we need to change your treatment, we will call you to review the results.   Testing/Procedures: Your physician has requested that you have a lower extremity arterial duplex. This test is an ultrasound of the arteries in the legs. It looks at arterial blood flow in the legs. Allow one hour for Lower Arterial scans. There are no restrictions or special instructions  Follow-Up: At [facility], you and your health needs are our priority.  As part of our continuing mission to provide you with exceptional heart care, we have created designated Provider Care Teams.  These Care Teams include your primary Cardiologist (physician) and Advanced Practice Providers (APPs -  Physician Assistants and Nurse Practitioners) who all work together to provide you with the care you need, when you need it.  We recommend signing up for the patient portal called "MyChart".  Sign up information is provided on this After Visit Summary.  MyChart is used to connect with patients for Virtual Visits (Telemedicine).  Patients are able to view lab/test results, encounter notes, upcoming appointments, etc.  Non-urgent messages can be sent to your provider as well.   To learn more about what you can do with MyChart, go to [email or web address].    Your next appointment:   12 month(s)  The format for your next appointment:   In Person  Provider:   You may see Larae Grooms, MD or  one of the following Advanced Practice Providers on your designated Care Team:    Melina Copa, PA-C  Ermalinda Barrios, PA-C    Other Instructions  High-Fiber Diet Fiber, also called dietary fiber, is a type of carbohydrate that is found in fruits, vegetables, whole grains, and beans. A high-fiber diet can have many health benefits. Your health care provider may recommend a high-fiber diet to help:  Prevent constipation. Fiber can make your bowel movements more regular.  Lower your cholesterol.  Relieve the following conditions: ? Swelling of veins in the anus (hemorrhoids). ? Swelling and irritation (inflammation) of specific areas of the digestive tract (uncomplicated diverticulosis). ? A problem of the large intestine (colon) that sometimes causes pain and diarrhea (irritable bowel syndrome, IBS).  Prevent overeating as part of a weight-loss plan.  Prevent heart disease, type 2 diabetes, and certain cancers. What is my plan? The recommended daily fiber intake in grams (g) includes:  38 g for men age 14 or younger.  30 g for men over age 47.  12 g for women age 15 or younger.  21 g for women over age 6. You can get the recommended daily intake of dietary fiber by:  Eating a variety of fruits, vegetables, grains, and beans.  Taking a fiber supplement, if it is not possible to get enough fiber through your diet. What do I need to know about a high-fiber diet?  It is better to get fiber through food sources rather than from fiber supplements. There is not a lot  of research about how effective supplements are.  Always check the fiber content on the nutrition facts label of any prepackaged food. Look for foods that contain 5 g of fiber or more per serving.  Talk with a diet and nutrition specialist (dietitian) if you have questions about specific foods that are recommended or not recommended for your medical condition, especially if those foods are not listed  below.  Gradually increase how much fiber you consume. If you increase your intake of dietary fiber too quickly, you may have bloating, cramping, or gas.  Drink plenty of water. Water helps you to digest fiber. What are tips for following this plan?  Eat a wide variety of high-fiber foods.  Make sure that half of the grains that you eat each day are whole grains.  Eat breads and cereals that are made with whole-grain flour instead of refined flour or white flour.  Eat brown rice, bulgur wheat, or millet instead of white rice.  Start the day with a breakfast that is high in fiber, such as a cereal that contains 5 g of fiber or more per serving.  Use beans in place of meat in soups, salads, and pasta dishes.  Eat high-fiber snacks, such as berries, raw vegetables, nuts, and popcorn.  Choose whole fruits and vegetables instead of processed forms like juice or sauce. What foods can I eat?  Fruits Berries. Pears. Apples. Oranges. Avocado. Prunes and raisins. Dried figs. Vegetables Sweet potatoes. Spinach. Kale. Artichokes. Cabbage. Broccoli. Cauliflower. Green peas. Carrots. Squash. Grains Whole-grain breads. Multigrain cereal. Oats and oatmeal. Brown rice. Barley. Bulgur wheat. [place]. Quinoa. Bran muffins. Popcorn. Rye wafer crackers. Meats and other proteins Navy, kidney, and pinto beans. Soybeans. Split peas. Lentils. Nuts and seeds. Dairy Fiber-fortified yogurt. Beverages Fiber-fortified soy milk. Fiber-fortified orange juice. Other foods Fiber bars. The items listed above may not be a complete list of recommended foods and beverages. Contact a dietitian for more options. What foods are not recommended? Fruits Fruit juice. Cooked, strained fruit. Vegetables Fried potatoes. Canned vegetables. Well-cooked vegetables. Grains White bread. Pasta made with refined flour. White rice. Meats and other proteins Fatty cuts of meat. Fried chicken or fried fish. Dairy Milk.  Yogurt. Cream cheese. Sour cream. Fats and oils Butters. Beverages Soft drinks. Other foods Cakes and pastries. The items listed above may not be a complete list of foods and beverages to avoid. Contact a dietitian for more information. Summary  Fiber is a type of carbohydrate. It is found in fruits, vegetables, whole grains, and beans.  There are many health benefits of eating a high-fiber diet, such as preventing constipation, lowering blood cholesterol, helping with weight loss, and reducing your risk of heart disease, diabetes, and certain cancers.  Gradually increase your intake of fiber. Increasing too fast can result in cramping, bloating, and gas. Drink plenty of water while you increase your fiber.  The best sources of fiber include whole fruits and vegetables, whole grains, nuts, seeds, and beans. This information is not intended to replace advice given to you by your health care provider. Make sure you discuss any questions you have with your health care provider. Document Revised: 05/04/2017 Document Reviewed: 05/04/2017 Elsevier Patient Education  2020 [employer].

## 2019-10-06 ENCOUNTER — Other Ambulatory Visit: Payer: Self-pay | Admitting: Interventional Cardiology

## 2019-10-06 DIAGNOSIS — I739 Peripheral vascular disease, unspecified: Secondary | ICD-10-CM

## 2019-10-07 ENCOUNTER — Ambulatory Visit
Admission: RE | Admit: 2019-10-07 | Discharge: 2019-10-07 | Disposition: A | Discharge status: Home / Self Care | Payer: Medicare HMO | Source: Ambulatory Visit | Attending: Internal Medicine | Admitting: Internal Medicine

## 2019-10-07 ENCOUNTER — Other Ambulatory Visit: Payer: Self-pay

## 2019-10-07 DIAGNOSIS — Z780 Asymptomatic menopausal state: Secondary | ICD-10-CM | POA: Diagnosis not present

## 2019-10-07 DIAGNOSIS — M8580 Other specified disorders of bone density and structure, unspecified site: Secondary | ICD-10-CM

## 2019-10-07 DIAGNOSIS — M85851 Other specified disorders of bone density and structure, right thigh: Secondary | ICD-10-CM | POA: Diagnosis not present

## 2019-10-10 ENCOUNTER — Other Ambulatory Visit: Payer: Self-pay

## 2019-10-10 ENCOUNTER — Ambulatory Visit (HOSPITAL_COMMUNITY)
Admission: RE | Admit: 2019-10-10 | Discharge: 2019-10-10 | Disposition: A | Discharge status: Home / Self Care | Payer: Medicare HMO | Source: Ambulatory Visit | Attending: Internal Medicine | Admitting: Internal Medicine

## 2019-10-10 DIAGNOSIS — I739 Peripheral vascular disease, unspecified: Secondary | ICD-10-CM | POA: Insufficient documentation

## 2019-10-13 ENCOUNTER — Telehealth: Payer: Self-pay | Admitting: *Deleted

## 2019-10-13 DIAGNOSIS — I739 Peripheral vascular disease, unspecified: Secondary | ICD-10-CM

## 2019-10-13 NOTE — Telephone Encounter (Signed)
Left message to call office

## 2019-10-13 NOTE — Telephone Encounter (Signed)
-----   Message from Jettie Booze, MD sent at 10/12/2019  9:26 PM EDT ----- Left SFA lesion noted with significant claudication.  Please have her see Dr. Fletcher Anon or Dr. Gwenlyn Found.

## 2019-10-14 NOTE — Telephone Encounter (Signed)
I spoke to the patient and informed her that someone would arrange a visit for her with Dr Gwenlyn Found or Dr Fletcher Anon per Dr Irish Lack.  She verbalized understanding.

## 2019-10-24 ENCOUNTER — Telehealth: Payer: Self-pay | Admitting: Interventional Cardiology

## 2019-10-24 NOTE — Telephone Encounter (Signed)
Patient states she is returning a call, however she is not certain what the missed call was in regards to.

## 2019-10-24 NOTE — Telephone Encounter (Signed)
Let the patient know that there is no documentation of anyone calling. She is set up to see Dr. Fletcher Anon on 4/20. She denies having any needs at this time. She will let us know of anything changes.

## 2019-10-25 ENCOUNTER — Telehealth: Payer: Self-pay | Admitting: Interventional Cardiology

## 2019-10-25 NOTE — Telephone Encounter (Signed)
Spoke with pt who report she is scheduled to travel by car to [facility] on Monday 4/19. Pt is wanting to cancel appointment with Dr. Fletcher Anon but questioning if based on doppler studies, if MD feels she is ok to travel long distance.   Will route to MD for recommendations.

## 2019-10-25 NOTE — Telephone Encounter (Signed)
Patient is scheduled to see Dr. Fletcher Anon tomorrow at the [facility] office. She was planning to  to travel by car to [place] and back and leave tomorrow. Please let her know if it is safe for her to travel or not.

## 2019-10-27 ENCOUNTER — Telehealth: Payer: Self-pay | Admitting: Cardiovascular Disease

## 2019-10-27 ENCOUNTER — Other Ambulatory Visit: Payer: Self-pay | Admitting: Cardiology

## 2019-10-27 MED ORDER — IRBESARTAN 150 MG PO TABS: 150.0000 mg | ORAL_TABLET | Freq: Every day | ORAL | 3 refills | Status: DC

## 2019-10-27 NOTE — Telephone Encounter (Signed)
Pt's medication was sent to pt's pharmacy as requested. Confirmation received.  °

## 2019-10-27 NOTE — Telephone Encounter (Signed)
Duplicate. See other note.

## 2019-10-27 NOTE — Telephone Encounter (Signed)
New message   This patient had to cancel appt because she is going out of town. I did not see any other np appointments at this time or would this go under a consult. Please call to the patient to advise when she can be rescheduled.

## 2019-10-28 NOTE — Telephone Encounter (Signed)
The patient has been called and advised. They will be gone through May 14th. A new patient appointment has been made for 5/18 with Dr. Fletcher Anon.

## 2019-10-28 NOTE — Telephone Encounter (Signed)
She can travel from a vascular standpoint.  I advise that she stops every 2 hours to walk for 5 to 10 minutes to stretch her legs.

## 2019-11-01 ENCOUNTER — Ambulatory Visit: Payer: Medicare HMO | Admitting: Cardiovascular Disease

## 2019-11-29 ENCOUNTER — Other Ambulatory Visit: Payer: Self-pay

## 2019-11-29 ENCOUNTER — Ambulatory Visit: Payer: Medicare HMO | Admitting: Cardiovascular Disease

## 2019-11-29 ENCOUNTER — Encounter: Payer: Self-pay | Admitting: Cardiovascular Disease

## 2019-11-29 VITALS — BP 142/76 | HR 90 | Temp 97.0°F | Ht 64.0 in | Wt 122.6 lb

## 2019-11-29 DIAGNOSIS — E782 Mixed hyperlipidemia: Secondary | ICD-10-CM | POA: Diagnosis not present

## 2019-11-29 DIAGNOSIS — I739 Peripheral vascular disease, unspecified: Secondary | ICD-10-CM | POA: Diagnosis not present

## 2019-11-29 DIAGNOSIS — I2510 Atherosclerotic heart disease of native coronary artery without angina pectoris: Secondary | ICD-10-CM

## 2019-11-29 DIAGNOSIS — I10 Essential (primary) hypertension: Secondary | ICD-10-CM | POA: Diagnosis not present

## 2019-11-29 NOTE — Patient Instructions (Signed)
Medication Instructions:  No changes *If you need a refill on your cardiac medications before your next appointment, please call your pharmacy*   Lab Work: None ordered If you have labs (blood work) drawn today and your tests are completely normal, you will receive your results only by: Marland Kitchen MyChart Message (if you have MyChart) OR . A paper copy in the mail If you have any lab test that is abnormal or we need to change your treatment, we will call you to review the results.   Testing/Procedures: None ordered   Follow-Up: At [facility], you and your health needs are our priority.  As part of our continuing mission to provide you with exceptional heart care, we have created designated Provider Care Teams.  These Care Teams include your primary Cardiologist (physician) and Advanced Practice Providers (APPs -  Physician Assistants and Nurse Practitioners) who all work together to provide you with the care you need, when you need it.  We recommend signing up for the patient portal called "MyChart".  Sign up information is provided on this After Visit Summary.  MyChart is used to connect with patients for Virtual Visits (Telemedicine).  Patients are able to view lab/test results, encounter notes, upcoming appointments, etc.  Non-urgent messages can be sent to your provider as well.   To learn more about what you can do with MyChart, go to [email or web address].    Your next appointment:   1 month(s)  The format for your next appointment:   In Person  Provider:   Kathlyn Sacramento, MD

## 2019-11-29 NOTE — Progress Notes (Signed)
Cardiology Office Note   Date:  11/29/2019   ID:  Melissa Elliott, DOB 08-28-1940, MRN [id number]  PCP:  Wenda Low, MD  Cardiologist: Dr. Irish Lack  No chief complaint on file.     History of Present Illness: Melissa Elliott is a 79 y.o. female who was referred by Dr. Irish Lack for evaluation management of peripheral arterial disease.  She has known history of coronary artery disease status post CABG in 1996 with subsequent stenting.  She has chronic medical conditions that include hyperlipidemia, stomach ulcers, prediabetes, essential hypertension and migraines. She is a very active person and exercises on a regular basis.  However, during the COVID-19 pandemic, she could not go to the gym.  She resumed in January and shortly after that she noticed left leg discomfort with walking.  This initially happened after walking one quarter of a mile and forced her to stop and rest for few minutes before she could resume walking again.  The symptoms became more severe as she exercised but then there was some improvement after.  She has no rest pain or lower extremity ulceration.  She was planning on participating in a 5K walk in April but she felt that she could not do it.  No chest pain or shortness of breath.  She is not a smoker. She underwent vascular studies in March which showed an ABI of 0.94 on the right and 0.79 on the left.  Duplex showed severe distal left SFA stenosis with peak velocity of 461.   Past Medical History:  Diagnosis Date  . Asthma   . Complication of anesthesia    "takes a long time to wakeup; sometimes days" (05/11/2018)  . Coronary artery disease   . H/O hiatal hernia   . Heart murmur   . High cholesterol    "on RX because of the bypass" (02/01/2013)  . History of duodenal ulcer   . Hot flashes    "on a regular basis" (05/11/2018)  . Hypertension   . Hypoglycemia    "that's something I have to watch constantly" (02/01/2013)  . Migraine    "probably  stopped w/menses" (05/11/2018)  . Pneumonia ? 1970's   "once" (05/11/2018)  . Sciatic nerve pain 12/2012   "RLE; just had it once" (05/11/2018)    Past Surgical History:  Procedure Laterality Date  . APPENDECTOMY  1970  . BREAST BIOPSY Left    benign  . COLONOSCOPY WITH PROPOFOL N/A 06/19/2015   Procedure: COLONOSCOPY WITH PROPOFOL;  Surgeon: Garlan Fair, MD;  Location: [facility];  Service: Endoscopy;  Laterality: N/A;  . CORONARY ANGIOPLASTY WITH STENT PLACEMENT  05/11/2018  . CORONARY ANGIOPLASTY WITH STENT PLACEMENT  1997  . CORONARY ARTERY BYPASS GRAFT  ~ 1997   CABG X2  . CORONARY STENT INTERVENTION N/A 05/11/2018   Procedure: CORONARY STENT INTERVENTION;  Surgeon: Jettie Booze, MD;  Location: [place] [facility];  Service: Cardiovascular;  Laterality: N/A;  . ESOPHAGOGASTRODUODENOSCOPY N/A 02/02/2013   Procedure: ESOPHAGOGASTRODUODENOSCOPY (EGD);  Surgeon: Wonda Horner, MD;  Location: [facility] ENDOSCOPY;  Service: Endoscopy;  Laterality: N/A;  . LEFT HEART CATH AND CORS/GRAFTS ANGIOGRAPHY N/A 05/11/2018   Procedure: LEFT HEART CATH AND CORS/GRAFTS ANGIOGRAPHY;  Surgeon: Jettie Booze, MD;  Location: [place] [facility];  Service: Cardiovascular;  Laterality: N/A;  . LEFT HEART CATH AND CORS/GRAFTS ANGIOGRAPHY N/A 05/14/2018   Procedure: LEFT HEART CATH AND CORS/GRAFTS ANGIOGRAPHY;  Surgeon: Jettie Booze, MD;  Location: [facility]  [facility];  Service: Cardiovascular;  Laterality: N/A;  . SKIN LESION EXCISION Left 1960's   "upper arm; sore that never healed" (02/01/2013)  . TONSILLECTOMY  1940's     Current Outpatient Medications  Medication Sig Dispense Refill  . ADVAIR DISKUS 250-50 MCG/DOSE AEPB Inhale 1 puff into the lungs 2 (two) times daily. Only uses in winter and spring months  11  . albuterol (PROVENTIL HFA;VENTOLIN HFA) 108 (90 Base) MCG/ACT inhaler Inhale 2 puffs into the lungs every 6 (six) hours as needed for wheezing. 1 Inhaler 3  . aspirin  (ASPIRIN EC) 81 MG EC tablet Take 81 mg by mouth daily. Swallow whole.    . Calcium Carbonate-Vitamin D (CALCIUM-D) 600-400 MG-UNIT TABS Take 1 tablet by mouth daily with lunch.    . cholecalciferol (VITAMIN D) 1000 units tablet Take 1,000 Units by mouth daily with lunch.    . clopidogrel (PLAVIX) 75 MG tablet TAKE 1 TABLET (75 MG TOTAL) BY MOUTH DAILY WITH BREAKFAST. 90 tablet 3  . fexofenadine (ALLEGRA) 180 MG tablet Take 180 mg by mouth daily as needed for allergies or rhinitis.    Marland Kitchen irbesartan (AVAPRO) 150 MG tablet Take 1 tablet (150 mg total) by mouth daily. 90 tablet 3  . metFORMIN (GLUCOPHAGE) 500 MG tablet Take 500 mg by mouth daily. with food  1  . metoprolol succinate (TOPROL-XL) 50 MG 24 hr tablet Take 50 mg by mouth at bedtime.     . montelukast (SINGULAIR) 10 MG tablet Take 10 mg by mouth at bedtime.    . Multiple Vitamins-Minerals (PRESERVISION AREDS 2) CAPS Take 1 capsule by mouth 2 (two) times daily.    . nitroGLYCERIN (NITROSTAT) 0.4 MG SL tablet Place 0.4 mg under the tongue every 5 (five) minutes as needed for chest pain (UP TO 3 DOSES BEFORE CALLING EMS).    . simvastatin (ZOCOR) 40 MG tablet Take 40 mg by mouth at bedtime.      No current facility-administered medications for this visit.    Allergies:   Ciprofloxacin, Sulfa antibiotics, Iodine, Eggs or egg-derived products, and Penicillins    Social History:  The patient  reports that she has never smoked. She has never used smokeless tobacco. She reports current alcohol use. She reports that she does not use drugs.   Family History:  The patient's family history includes Asthma in her mother; Breast cancer in her paternal aunt; CAD in her mother; Heart attack in her father; Hypertension in her father, maternal grandfather, maternal grandmother, and mother.    ROS:  Please see the history of present illness.   Otherwise, review of systems are positive for none.   All other systems are reviewed and negative.     PHYSICAL EXAM: VS:  BP (!) 142/76   Pulse 90   Temp (!) 97 F (36.1 C)   Ht 5\' 4"  (1.626 m)   Wt 122 lb 9.6 oz (55.6 kg)   SpO2 98%   BMI 21.04 kg/m  , BMI Body mass index is 21.04 kg/m. GEN: Well nourished, well developed, in no acute distress  HEENT: normal  Neck: no JVD, carotid bruits, or masses Cardiac: RRR; no murmurs, rubs, or gallops,no edema  Respiratory:  clear to auscultation bilaterally, normal work of breathing GI: soft, nontender, nondistended, + BS MS: no deformity or atrophy  Skin: warm and dry, no rash Neuro:  Strength and sensation are intact Psych: euthymic mood, full affect Vascular: Femoral pulses slightly diminished bilaterally.  Distal pulses are not palpable.  EKG:  EKG is not ordered today.    Recent Labs: No results found for requested labs within last 8760 hours.    Lipid Panel    Component Value Date/Time   CHOL 157 05/14/2018 0203   TRIG 120 05/14/2018 0203   HDL 52 05/14/2018 0203   CHOLHDL 3.0 05/14/2018 0203   VLDL 24 05/14/2018 0203   LDLCALC 81 05/14/2018 0203      Wt Readings from Last 3 Encounters:  11/29/19 122 lb 9.6 oz (55.6 kg)  10/03/19 119 lb 3.2 oz (54.1 kg)  09/22/18 126 lb 6.4 oz (57.3 kg)        No flowsheet data found.    ASSESSMENT AND PLAN:  1.  Peripheral arterial disease: Severe left calf claudication Rutherford class III due to severe distal SFA disease.  I discussed with her the natural history and management of claudication.  She is already on good medical therapy.  We discussed the option of management including attempting a walking program or proceeding with angiography and possible endovascular intervention.  The patient was instructed on the proper way of doing a walking program and she wants to do that for at least 1 month before considering invasive evaluation.  She is allergic to contrast and any angiography will require premedication.  2.  Coronary artery disease involving native coronary  arteries without angina: She is overall doing well.  She is on dual antiplatelet therapy.  3.  Essential hypertension: Blood pressure is controlled.  4.  Hyperlipidemia: Currently on simvastatin 40 mg daily.  Most recent LDL that I see was 81 and recent triglyceride was 250.  We should consider a more potent statin.    Disposition:   FU with me in 1 month  Signed,  Kathlyn Sacramento, MD  11/29/2019 10:14 AM    [place]

## 2020-01-10 ENCOUNTER — Other Ambulatory Visit: Payer: Self-pay

## 2020-01-10 ENCOUNTER — Ambulatory Visit: Payer: Medicare HMO | Admitting: Cardiovascular Disease

## 2020-01-10 ENCOUNTER — Encounter: Payer: Self-pay | Admitting: Cardiovascular Disease

## 2020-01-10 VITALS — BP 140/60 | HR 83 | Temp 97.2°F | Ht 64.0 in | Wt 124.4 lb

## 2020-01-10 DIAGNOSIS — I10 Essential (primary) hypertension: Secondary | ICD-10-CM | POA: Diagnosis not present

## 2020-01-10 DIAGNOSIS — E785 Hyperlipidemia, unspecified: Secondary | ICD-10-CM | POA: Diagnosis not present

## 2020-01-10 DIAGNOSIS — Z01810 Encounter for preprocedural cardiovascular examination: Secondary | ICD-10-CM | POA: Diagnosis not present

## 2020-01-10 DIAGNOSIS — I739 Peripheral vascular disease, unspecified: Secondary | ICD-10-CM

## 2020-01-10 DIAGNOSIS — I2510 Atherosclerotic heart disease of native coronary artery without angina pectoris: Secondary | ICD-10-CM | POA: Diagnosis not present

## 2020-01-10 DIAGNOSIS — Z01818 Encounter for other preprocedural examination: Secondary | ICD-10-CM

## 2020-01-10 MED ORDER — DIPHENHYDRAMINE HCL 50 MG PO TABS: ORAL_TABLET | ORAL | 0 refills | Status: DC

## 2020-01-10 MED ORDER — PREDNISONE 50 MG PO TABS: ORAL_TABLET | ORAL | 0 refills | Status: DC

## 2020-01-10 NOTE — H&P (View-Only) (Signed)
Cardiology Office Note   Date:  01/10/2020   ID:  Melissa Elliott, DOB 05-26-1941, MRN [id number]  PCP:  Wenda Low, MD  Cardiologist: Dr. Irish Lack  No chief complaint on file.     History of Present Illness: Melissa Elliott is a 79 y.o. female who is here today for follow-up visit regarding peripheral arterial disease.   She has known history of coronary artery disease status post CABG in 1996 with subsequent stenting.  She has chronic medical conditions that include hyperlipidemia, stomach ulcers, prediabetes, essential hypertension and migraines. She is a very active person and exercises on a regular basis.  She was seen recently for progressive left calf claudication which significantly affected her ability to exercise. She underwent vascular studies in March which showed an ABI of 0.94 on the right and 0.79 on the left.  Duplex showed severe distal left SFA stenosis with peak velocity of 461.  I suggested starting an exercise program first.  She tried that for 1 month and reports no significant improvement in symptoms.  She feels very limited by left calf claudication especially if she is going uphill.  Although symptoms do not start until she walks at least a quarter of a mile, for her, this is very limiting given how active she is.  No rest pain or lower extremity ulceration.   Past Medical History:  Diagnosis Date  . Asthma   . Complication of anesthesia    "takes a long time to wakeup; sometimes days" (05/11/2018)  . Coronary artery disease   . H/O hiatal hernia   . Heart murmur   . High cholesterol    "on RX because of the bypass" (02/01/2013)  . History of duodenal ulcer   . Hot flashes    "on a regular basis" (05/11/2018)  . Hypertension   . Hypoglycemia    "that's something I have to watch constantly" (02/01/2013)  . Migraine    "probably stopped w/menses" (05/11/2018)  . Pneumonia ? 1970's   "once" (05/11/2018)  . Sciatic nerve pain 12/2012   "RLE;  just had it once" (05/11/2018)    Past Surgical History:  Procedure Laterality Date  . APPENDECTOMY  1970  . BREAST BIOPSY Left    benign  . COLONOSCOPY WITH PROPOFOL N/A 06/19/2015   Procedure: COLONOSCOPY WITH PROPOFOL;  Surgeon: Garlan Fair, MD;  Location: [facility];  Service: Endoscopy;  Laterality: N/A;  . CORONARY ANGIOPLASTY WITH STENT PLACEMENT  05/11/2018  . CORONARY ANGIOPLASTY WITH STENT PLACEMENT  1997  . CORONARY ARTERY BYPASS GRAFT  ~ 1997   CABG X2  . CORONARY STENT INTERVENTION N/A 05/11/2018   Procedure: CORONARY STENT INTERVENTION;  Surgeon: Jettie Booze, MD;  Location: [place] [facility];  Service: Cardiovascular;  Laterality: N/A;  . ESOPHAGOGASTRODUODENOSCOPY N/A 02/02/2013   Procedure: ESOPHAGOGASTRODUODENOSCOPY (EGD);  Surgeon: Wonda Horner, MD;  Location: [facility] ENDOSCOPY;  Service: Endoscopy;  Laterality: N/A;  . LEFT HEART CATH AND CORS/GRAFTS ANGIOGRAPHY N/A 05/11/2018   Procedure: LEFT HEART CATH AND CORS/GRAFTS ANGIOGRAPHY;  Surgeon: Jettie Booze, MD;  Location: [place] [facility];  Service: Cardiovascular;  Laterality: N/A;  . LEFT HEART CATH AND CORS/GRAFTS ANGIOGRAPHY N/A 05/14/2018   Procedure: LEFT HEART CATH AND CORS/GRAFTS ANGIOGRAPHY;  Surgeon: Jettie Booze, MD;  Location: [place] [facility];  Service: Cardiovascular;  Laterality: N/A;  . SKIN LESION EXCISION Left 1960's   "upper arm; sore that never healed" (02/01/2013)  . TONSILLECTOMY  1940's  Current Outpatient Medications  Medication Sig Dispense Refill  . ADVAIR DISKUS 250-50 MCG/DOSE AEPB Inhale 1 puff into the lungs 2 (two) times daily. Only uses in winter and spring months  11  . albuterol (PROVENTIL HFA;VENTOLIN HFA) 108 (90 Base) MCG/ACT inhaler Inhale 2 puffs into the lungs every 6 (six) hours as needed for wheezing. 1 Inhaler 3  . aspirin (ASPIRIN EC) 81 MG EC tablet Take 81 mg by mouth daily. Swallow whole.    . Calcium Carbonate-Vitamin D (CALCIUM-D)  600-400 MG-UNIT TABS Take 1 tablet by mouth daily with lunch.    . cholecalciferol (VITAMIN D) 1000 units tablet Take 1,000 Units by mouth daily with lunch.    . clopidogrel (PLAVIX) 75 MG tablet TAKE 1 TABLET (75 MG TOTAL) BY MOUTH DAILY WITH BREAKFAST. 90 tablet 3  . fexofenadine (ALLEGRA) 180 MG tablet Take 180 mg by mouth daily as needed for allergies or rhinitis.    Marland Kitchen irbesartan (AVAPRO) 150 MG tablet Take 1 tablet (150 mg total) by mouth daily. 90 tablet 3  . metFORMIN (GLUCOPHAGE) 500 MG tablet Take 500 mg by mouth daily. with food  1  . metoprolol succinate (TOPROL-XL) 50 MG 24 hr tablet Take 50 mg by mouth at bedtime.     . montelukast (SINGULAIR) 10 MG tablet Take 10 mg by mouth at bedtime.    . Multiple Vitamins-Minerals (PRESERVISION AREDS 2) CAPS Take 1 capsule by mouth 2 (two) times daily.    . nitroGLYCERIN (NITROSTAT) 0.4 MG SL tablet Place 0.4 mg under the tongue every 5 (five) minutes as needed for chest pain (UP TO 3 DOSES BEFORE CALLING EMS).    . simvastatin (ZOCOR) 40 MG tablet Take 40 mg by mouth at bedtime.     . diphenhydrAMINE (BENADRYL) 50 MG tablet Take one 50 mg tablet the morning of the procedure with the last dose of the prednisone. 1 tablet 0  . predniSONE (DELTASONE) 50 MG tablet Take one 50 mg tablet every 6 hours (total of 3 doses) with the last tablet the morning of the procedure. 3 tablet 0   No current facility-administered medications for this visit.    Allergies:   Ciprofloxacin, Sulfa antibiotics, Iodine, Eggs or egg-derived products, and Penicillins    Social History:  The patient  reports that she has never smoked. She has never used smokeless tobacco. She reports current alcohol use. She reports that she does not use drugs.   Family History:  The patient's family history includes Asthma in her mother; Breast cancer in her paternal aunt; CAD in her mother; Heart attack in her father; Hypertension in her father, maternal grandfather, maternal  grandmother, and mother.    ROS:  Please see the history of present illness.   Otherwise, review of systems are positive for none.   All other systems are reviewed and negative.    PHYSICAL EXAM: VS:  BP 140/60   Pulse 83   Temp (!) 97.2 F (36.2 C)   Ht 5\' 4"  (1.626 m)   Wt 124 lb 6.4 oz (56.4 kg)   SpO2 97%   BMI 21.35 kg/m  , BMI Body mass index is 21.35 kg/m. GEN: Well nourished, well developed, in no acute distress  HEENT: normal  Neck: no JVD, carotid bruits, or masses Cardiac: RRR; no murmurs, rubs, or gallops,no edema  Respiratory:  clear to auscultation bilaterally, normal work of breathing GI: soft, nontender, nondistended, + BS MS: no deformity or atrophy  Skin: warm and dry, no  rash Neuro:  Strength and sensation are intact Psych: euthymic mood, full affect Vascular: Femoral pulses slightly diminished bilaterally.  Distal pulses are not palpable.   EKG:  EKG is not ordered today.    Recent Labs: No results found for requested labs within last 8760 hours.    Lipid Panel    Component Value Date/Time   CHOL 157 05/14/2018 0203   TRIG 120 05/14/2018 0203   HDL 52 05/14/2018 0203   CHOLHDL 3.0 05/14/2018 0203   VLDL 24 05/14/2018 0203   LDLCALC 81 05/14/2018 0203      Wt Readings from Last 3 Encounters:  01/10/20 124 lb 6.4 oz (56.4 kg)  11/29/19 122 lb 9.6 oz (55.6 kg)  10/03/19 119 lb 3.2 oz (54.1 kg)        No flowsheet data found.    ASSESSMENT AND PLAN:  1.  Peripheral arterial disease: Severe left calf claudication Rutherford class III due to severe distal SFA disease.  The patient tried a walking exercise program but reports no improvement.  She wants to be more active again and thus recommend proceeding with abdominal aortogram with lower extremity angiography and possible endovascular intervention.  I discussed the procedure in details as well as risks and benefits.  She is allergic to contrast and any angiography will require  premedication. She is already on dual antiplatelet therapy.  2.  Coronary artery disease involving native coronary arteries without angina: She is overall doing well.  She is on dual antiplatelet therapy.  3.  Essential hypertension: Blood pressure is controlled.  4.  Hyperlipidemia: Currently on simvastatin 40 mg daily.  Most recent LDL that I see was 81 and recent triglyceride was 250.  We should consider a more potent statin.    Disposition:   FU with me in 1 month  Signed,  Kathlyn Sacramento, MD  01/10/2020 9:45 AM    [place]

## 2020-01-10 NOTE — Patient Instructions (Signed)
Medication Instructions:  No changes *If you need a refill on your cardiac medications before your next appointment, please call your pharmacy*   Lab Work: Your provider would like for you to return by 02/01/20 to have the following labs drawn: BMET and CBC. You do not need an appointment for the lab. Once in our office lobby there is a podium where you can sign in and ring the doorbell to alert [place] that you are here. The lab is open from 8:00 am to 4:30 pm; closed for lunch from 12:45pm-1:45pm. You do not need to be fasting.   If you have labs (blood work) drawn today and your tests are completely normal, you will receive your results only by: Marland Kitchen MyChart Message (if you have MyChart) OR . A paper copy in the mail If you have any lab test that is abnormal or we need to change your treatment, we will call you to review the results.   Testing/Procedures: Your physician has requested that you have a peripheral vascular angiogram. This exam is performed at the hospital. During this exam IV contrast is used to look at arterial blood flow. Please review the information sheet given for details.  Follow-Up: At [facility], you and your health needs are our priority.  As part of our continuing mission to provide you with exceptional heart care, we have created designated Provider Care Teams.  These Care Teams include your primary Cardiologist (physician) and Advanced Practice Providers (APPs -  Physician Assistants and Nurse Practitioners) who all work together to provide you with the care you need, when you need it.  We recommend signing up for the patient portal called "MyChart".  Sign up information is provided on this After Visit Summary.  MyChart is used to connect with patients for Virtual Visits (Telemedicine).  Patients are able to view lab/test results, encounter notes, upcoming appointments, etc.  Non-urgent messages can be sent to your provider as well.   To learn more about what you can do  with MyChart, go to [email or web address].    Your next appointment:   Keep your post procedure follow up with Dr. Fletcher Anon on 8/24 at 10:40 am  Other Instructions    [place] [address] [place] [address] Dept: [phone] Loc: [place]  01/10/2020  You are scheduled for a Peripheral Angiogram on Wednesday, July 28 with Dr. Kathlyn Sacramento.  1. Please arrive at the [facility] (Main Entrance A) at [facility]: [address] [place], [place] [address] at 6:30 AM (This time is two hours before your procedure to ensure your preparation). Free valet parking service is available.   Special note: Every effort is made to have your procedure done on time. Please understand that emergencies sometimes delay scheduled procedures.  2. Diet: Do not eat solid foods after midnight.  The patient may have clear liquids until 5am upon the day of the procedure.  3. Labs: You will need to have blood drawn  by 02/01/20  4. Medication instructions in preparation for your procedure: Hold Metformin the morning of the procedure and then 48 hours after.  CONTRAST ALLERGY: 13 hours prior to your procedure time take one Prednisone 50 mg tablet, then 7 hours prior to your procedure  take one Prednisone 50 mg tablet, and then 1-2 hours prior to procedure time (just before you leave for the hospital) take one Prednisone 50 mg tablet and one  Benadryl 50 mg tablet. Please call the office if you have any questions.  On the morning of your procedure, take your Aspirin and Plavix/Clopidogrel and any morning medicines NOT listed above.  You may use sips of water.  5. Plan for one night stay--bring personal belongings. 6. Bring a current list of your medications and current insurance cards. 7. You MUST have a responsible person to drive you home. 8. Someone MUST be with you the first 24 hours  after you arrive home or your discharge will be delayed. 9. Please wear clothes that are easy to get on and off and wear slip-on shoes.  Thank you for allowing [place] to care for you!   -- [facility]

## 2020-01-10 NOTE — Progress Notes (Signed)
Cardiology Office Note   Date:  01/10/2020   ID:  Melissa Elliott, DOB 1940/12/30, MRN [id number]  PCP:  Wenda Low, MD  Cardiologist: Dr. Irish Lack  No chief complaint on file.     History of Present Illness: Melissa Elliott is a 79 y.o. female who is here today for follow-up visit regarding peripheral arterial disease.   She has known history of coronary artery disease status post CABG in 1996 with subsequent stenting.  She has chronic medical conditions that include hyperlipidemia, stomach ulcers, prediabetes, essential hypertension and migraines. She is a very active person and exercises on a regular basis.  She was seen recently for progressive left calf claudication which significantly affected her ability to exercise. She underwent vascular studies in March which showed an ABI of 0.94 on the right and 0.79 on the left.  Duplex showed severe distal left SFA stenosis with peak velocity of 461.  I suggested starting an exercise program first.  She tried that for 1 month and reports no significant improvement in symptoms.  She feels very limited by left calf claudication especially if she is going uphill.  Although symptoms do not start until she walks at least a quarter of a mile, for her, this is very limiting given how active she is.  No rest pain or lower extremity ulceration.   Past Medical History:  Diagnosis Date  . Asthma   . Complication of anesthesia    "takes a long time to wakeup; sometimes days" (05/11/2018)  . Coronary artery disease   . H/O hiatal hernia   . Heart murmur   . High cholesterol    "on RX because of the bypass" (02/01/2013)  . History of duodenal ulcer   . Hot flashes    "on a regular basis" (05/11/2018)  . Hypertension   . Hypoglycemia    "that's something I have to watch constantly" (02/01/2013)  . Migraine    "probably stopped w/menses" (05/11/2018)  . Pneumonia ? 1970's   "once" (05/11/2018)  . Sciatic nerve pain 12/2012   "RLE;  just had it once" (05/11/2018)    Past Surgical History:  Procedure Laterality Date  . APPENDECTOMY  1970  . BREAST BIOPSY Left    benign  . COLONOSCOPY WITH PROPOFOL N/A 06/19/2015   Procedure: COLONOSCOPY WITH PROPOFOL;  Surgeon: Garlan Fair, MD;  Location: [facility];  Service: Endoscopy;  Laterality: N/A;  . CORONARY ANGIOPLASTY WITH STENT PLACEMENT  05/11/2018  . CORONARY ANGIOPLASTY WITH STENT PLACEMENT  1997  . CORONARY ARTERY BYPASS GRAFT  ~ 1997   CABG X2  . CORONARY STENT INTERVENTION N/A 05/11/2018   Procedure: CORONARY STENT INTERVENTION;  Surgeon: Jettie Booze, MD;  Location: [place] [facility];  Service: Cardiovascular;  Laterality: N/A;  . ESOPHAGOGASTRODUODENOSCOPY N/A 02/02/2013   Procedure: ESOPHAGOGASTRODUODENOSCOPY (EGD);  Surgeon: Wonda Horner, MD;  Location: [facility];  Service: Endoscopy;  Laterality: N/A;  . LEFT HEART CATH AND CORS/GRAFTS ANGIOGRAPHY N/A 05/11/2018   Procedure: LEFT HEART CATH AND CORS/GRAFTS ANGIOGRAPHY;  Surgeon: Jettie Booze, MD;  Location: [place] [facility];  Service: Cardiovascular;  Laterality: N/A;  . LEFT HEART CATH AND CORS/GRAFTS ANGIOGRAPHY N/A 05/14/2018   Procedure: LEFT HEART CATH AND CORS/GRAFTS ANGIOGRAPHY;  Surgeon: Jettie Booze, MD;  Location: [place] [facility];  Service: Cardiovascular;  Laterality: N/A;  . SKIN LESION EXCISION Left 1960's   "upper arm; sore that never healed" (02/01/2013)  . TONSILLECTOMY  1940's  Current Outpatient Medications  Medication Sig Dispense Refill  . ADVAIR DISKUS 250-50 MCG/DOSE AEPB Inhale 1 puff into the lungs 2 (two) times daily. Only uses in winter and spring months  11  . albuterol (PROVENTIL HFA;VENTOLIN HFA) 108 (90 Base) MCG/ACT inhaler Inhale 2 puffs into the lungs every 6 (six) hours as needed for wheezing. 1 Inhaler 3  . aspirin (ASPIRIN EC) 81 MG EC tablet Take 81 mg by mouth daily. Swallow whole.    . Calcium Carbonate-Vitamin D (CALCIUM-D)  600-400 MG-UNIT TABS Take 1 tablet by mouth daily with lunch.    . cholecalciferol (VITAMIN D) 1000 units tablet Take 1,000 Units by mouth daily with lunch.    . clopidogrel (PLAVIX) 75 MG tablet TAKE 1 TABLET (75 MG TOTAL) BY MOUTH DAILY WITH BREAKFAST. 90 tablet 3  . fexofenadine (ALLEGRA) 180 MG tablet Take 180 mg by mouth daily as needed for allergies or rhinitis.    Marland Kitchen irbesartan (AVAPRO) 150 MG tablet Take 1 tablet (150 mg total) by mouth daily. 90 tablet 3  . metFORMIN (GLUCOPHAGE) 500 MG tablet Take 500 mg by mouth daily. with food  1  . metoprolol succinate (TOPROL-XL) 50 MG 24 hr tablet Take 50 mg by mouth at bedtime.     . montelukast (SINGULAIR) 10 MG tablet Take 10 mg by mouth at bedtime.    . Multiple Vitamins-Minerals (PRESERVISION AREDS 2) CAPS Take 1 capsule by mouth 2 (two) times daily.    . nitroGLYCERIN (NITROSTAT) 0.4 MG SL tablet Place 0.4 mg under the tongue every 5 (five) minutes as needed for chest pain (UP TO 3 DOSES BEFORE CALLING EMS).    . simvastatin (ZOCOR) 40 MG tablet Take 40 mg by mouth at bedtime.     . diphenhydrAMINE (BENADRYL) 50 MG tablet Take one 50 mg tablet the morning of the procedure with the last dose of the prednisone. 1 tablet 0  . predniSONE (DELTASONE) 50 MG tablet Take one 50 mg tablet every 6 hours (total of 3 doses) with the last tablet the morning of the procedure. 3 tablet 0   No current facility-administered medications for this visit.    Allergies:   Ciprofloxacin, Sulfa antibiotics, Iodine, Eggs or egg-derived products, and Penicillins    Social History:  The patient  reports that she has never smoked. She has never used smokeless tobacco. She reports current alcohol use. She reports that she does not use drugs.   Family History:  The patient's family history includes Asthma in her mother; Breast cancer in her paternal aunt; CAD in her mother; Heart attack in her father; Hypertension in her father, maternal grandfather, maternal  grandmother, and mother.    ROS:  Please see the history of present illness.   Otherwise, review of systems are positive for none.   All other systems are reviewed and negative.    PHYSICAL EXAM: VS:  BP 140/60   Pulse 83   Temp (!) 97.2 F (36.2 C)   Ht 5\' 4"  (1.626 m)   Wt 124 lb 6.4 oz (56.4 kg)   SpO2 97%   BMI 21.35 kg/m  , BMI Body mass index is 21.35 kg/m. GEN: Well nourished, well developed, in no acute distress  HEENT: normal  Neck: no JVD, carotid bruits, or masses Cardiac: RRR; no murmurs, rubs, or gallops,no edema  Respiratory:  clear to auscultation bilaterally, normal work of breathing GI: soft, nontender, nondistended, + BS MS: no deformity or atrophy  Skin: warm and dry, no  rash Neuro:  Strength and sensation are intact Psych: euthymic mood, full affect Vascular: Femoral pulses slightly diminished bilaterally.  Distal pulses are not palpable.   EKG:  EKG is not ordered today.    Recent Labs: No results found for requested labs within last 8760 hours.    Lipid Panel    Component Value Date/Time   CHOL 157 05/14/2018 0203   TRIG 120 05/14/2018 0203   HDL 52 05/14/2018 0203   CHOLHDL 3.0 05/14/2018 0203   VLDL 24 05/14/2018 0203   LDLCALC 81 05/14/2018 0203      Wt Readings from Last 3 Encounters:  01/10/20 124 lb 6.4 oz (56.4 kg)  11/29/19 122 lb 9.6 oz (55.6 kg)  10/03/19 119 lb 3.2 oz (54.1 kg)        No flowsheet data found.    ASSESSMENT AND PLAN:  1.  Peripheral arterial disease: Severe left calf claudication Rutherford class III due to severe distal SFA disease.  The patient tried a walking exercise program but reports no improvement.  She wants to be more active again and thus recommend proceeding with abdominal aortogram with lower extremity angiography and possible endovascular intervention.  I discussed the procedure in details as well as risks and benefits.  She is allergic to contrast and any angiography will require  premedication. She is already on dual antiplatelet therapy.  2.  Coronary artery disease involving native coronary arteries without angina: She is overall doing well.  She is on dual antiplatelet therapy.  3.  Essential hypertension: Blood pressure is controlled.  4.  Hyperlipidemia: Currently on simvastatin 40 mg daily.  Most recent LDL that I see was 81 and recent triglyceride was 250.  We should consider a more potent statin.    Disposition:   FU with me in 1 month  Signed,  Kathlyn Sacramento, MD  01/10/2020 9:45 AM    [place]

## 2020-01-30 ENCOUNTER — Telehealth: Payer: Self-pay | Admitting: Cardiovascular Disease

## 2020-01-30 NOTE — Telephone Encounter (Signed)
Melissa Elliott is calling stating she was looking over her paperwork for her upcoming procedure with Dr. Fletcher Anon and realized it states she needs blood work done by 02/01/20. [name] is currently in [place] and won't be back until 02/02/20. She states she could get the labs on 02/02/20 at the [facility] when she gets back or have them performed in [place] if that is what Dr. Fletcher Anon would prefer. Please advise.

## 2020-01-30 NOTE — Telephone Encounter (Signed)
Returned call to patient, patient is out of town but will be back on 7/22.  Procedure date 7/28. She states she will make sure she is back on 7/22 in time to get these labs drawn.   She also wanted to confirm she doesn't need a covid test.    She is fully vaccinated (vaccine record in chart). advised per current policy, no covid test needed.

## 2020-02-02 DIAGNOSIS — I739 Peripheral vascular disease, unspecified: Secondary | ICD-10-CM | POA: Diagnosis not present

## 2020-02-02 DIAGNOSIS — Z01818 Encounter for other preprocedural examination: Secondary | ICD-10-CM | POA: Diagnosis not present

## 2020-02-03 LAB — CBC
Hematocrit: 36.2 % (ref 34.0–46.6)
Hemoglobin: 12.2 g/dL (ref 11.1–15.9)
MCH: 29.3 pg (ref 26.6–33.0)
MCHC: 33.7 g/dL (ref 31.5–35.7)
MCV: 87 fL (ref 79–97)
Platelets: 289 10*3/uL (ref 150–450)
RBC: 4.17 x10E6/uL (ref 3.77–5.28)
RDW: 12.8 % (ref 11.7–15.4)
WBC: 8.6 10*3/uL (ref 3.4–10.8)

## 2020-02-03 LAB — BASIC METABOLIC PANEL
BUN/Creatinine Ratio: 15 (ref 12–28)
BUN: 17 mg/dL (ref 8–27)
CO2: 24 mmol/L (ref 20–29)
Calcium: 9.1 mg/dL (ref 8.7–10.3)
Chloride: 102 mmol/L (ref 96–106)
Creatinine, Ser: 1.11 mg/dL — ABNORMAL HIGH (ref 0.57–1.00)
GFR calc Af Amer: 55 mL/min/{1.73_m2} — ABNORMAL LOW (ref >59)
GFR calc non Af Amer: 48 mL/min/{1.73_m2} — ABNORMAL LOW (ref >59)
Glucose: 136 mg/dL — ABNORMAL HIGH (ref 65–99)
Potassium: 5.1 mmol/L (ref 3.5–5.2)
Sodium: 142 mmol/L (ref 134–144)

## 2020-02-06 ENCOUNTER — Telehealth: Payer: Self-pay | Admitting: *Deleted

## 2020-02-06 NOTE — Telephone Encounter (Signed)
Pt contacted pre-abdominal aortogram  scheduled at [facility] for: Wednesday February 08, 2020 8:30 AM Verified arrival time and place: [place]) at: 6:30 AM  No solid food after midnight prior to cath, clear liquids until 5 AM day of procedure.  CONTRAST ALLERGY: 13 hour Prednisone and Benadryl Prep reviewed with patient: 02/07/20 Prednisone 50 mg 7:30 PM 02/08/20 Prednisone 50 mg 1:30 AM 02/08/20 Prednisone 50 mg and Benadryl 50 mg just prior to leaving home for hospital Pt advised not to drive to hospital  Hold: Metformin-day of procedure and 48 hours post procedure  Except hold medications AM meds can be  taken pre-cath with sips of water including: ASA 81 mg Plavix 75 mg Prednisone 50 mg Benadryl 50 mg  Confirmed patient has responsible adult to drive home post procedure and observe 24 hours after arriving home: yes  You are allowed ONE visitor in the waiting room during your procedure. Both you and your visitor must wear a mask once you enter the hospital.      COVID-19 Pre-Screening Questions:   In the past 14 days have you had a new cough associated with shortness of breath, fever (100.4 or greater) or sudden loss of taste or sense of smell? no  In the past 14 days have you been around anyone with known Covid 19? no  Any international travel in the past 14 days? no  Have you been vaccinated for COVID-19? Yes, see immunization history   Reviewed procedure/mask/visitor instructions, COVID-19 screening questions with patient.

## 2020-02-07 DIAGNOSIS — I10 Essential (primary) hypertension: Secondary | ICD-10-CM | POA: Diagnosis not present

## 2020-02-07 DIAGNOSIS — I739 Peripheral vascular disease, unspecified: Secondary | ICD-10-CM | POA: Diagnosis not present

## 2020-02-07 DIAGNOSIS — E782 Mixed hyperlipidemia: Secondary | ICD-10-CM | POA: Diagnosis not present

## 2020-02-07 DIAGNOSIS — R7309 Other abnormal glucose: Secondary | ICD-10-CM | POA: Diagnosis not present

## 2020-02-07 DIAGNOSIS — I700 Atherosclerosis of aorta: Secondary | ICD-10-CM | POA: Diagnosis not present

## 2020-02-07 DIAGNOSIS — J4530 Mild persistent asthma, uncomplicated: Secondary | ICD-10-CM | POA: Diagnosis not present

## 2020-02-07 DIAGNOSIS — N183 Chronic kidney disease, stage 3 unspecified: Secondary | ICD-10-CM | POA: Diagnosis not present

## 2020-02-07 DIAGNOSIS — I25810 Atherosclerosis of coronary artery bypass graft(s) without angina pectoris: Secondary | ICD-10-CM | POA: Diagnosis not present

## 2020-02-08 ENCOUNTER — Encounter (HOSPITAL_COMMUNITY)
Admission: RE | Disposition: A | Discharge status: Home / Self Care | Payer: Self-pay | Source: Home / Self Care | Attending: Cardiovascular Disease

## 2020-02-08 ENCOUNTER — Other Ambulatory Visit: Payer: Self-pay

## 2020-02-08 ENCOUNTER — Other Ambulatory Visit: Payer: Self-pay | Admitting: *Deleted

## 2020-02-08 ENCOUNTER — Ambulatory Visit (HOSPITAL_COMMUNITY)
Admission: RE | Admit: 2020-02-08 | Discharge: 2020-02-08 | Disposition: A | Discharge status: Home / Self Care | Payer: Medicare HMO | Attending: Cardiovascular Disease | Admitting: Cardiovascular Disease

## 2020-02-08 DIAGNOSIS — I10 Essential (primary) hypertension: Secondary | ICD-10-CM | POA: Insufficient documentation

## 2020-02-08 DIAGNOSIS — Z7982 Long term (current) use of aspirin: Secondary | ICD-10-CM | POA: Insufficient documentation

## 2020-02-08 DIAGNOSIS — I2510 Atherosclerotic heart disease of native coronary artery without angina pectoris: Secondary | ICD-10-CM | POA: Diagnosis not present

## 2020-02-08 DIAGNOSIS — Z951 Presence of aortocoronary bypass graft: Secondary | ICD-10-CM | POA: Diagnosis not present

## 2020-02-08 DIAGNOSIS — J45909 Unspecified asthma, uncomplicated: Secondary | ICD-10-CM | POA: Insufficient documentation

## 2020-02-08 DIAGNOSIS — E785 Hyperlipidemia, unspecified: Secondary | ICD-10-CM | POA: Diagnosis not present

## 2020-02-08 DIAGNOSIS — R7303 Prediabetes: Secondary | ICD-10-CM | POA: Diagnosis not present

## 2020-02-08 DIAGNOSIS — Z882 Allergy status to sulfonamides status: Secondary | ICD-10-CM | POA: Insufficient documentation

## 2020-02-08 DIAGNOSIS — Z7902 Long term (current) use of antithrombotics/antiplatelets: Secondary | ICD-10-CM | POA: Insufficient documentation

## 2020-02-08 DIAGNOSIS — Z880 Allergy status to penicillin: Secondary | ICD-10-CM | POA: Insufficient documentation

## 2020-02-08 DIAGNOSIS — I70212 Atherosclerosis of native arteries of extremities with intermittent claudication, left leg: Secondary | ICD-10-CM | POA: Insufficient documentation

## 2020-02-08 DIAGNOSIS — Z79899 Other long term (current) drug therapy: Secondary | ICD-10-CM | POA: Insufficient documentation

## 2020-02-08 DIAGNOSIS — I739 Peripheral vascular disease, unspecified: Secondary | ICD-10-CM

## 2020-02-08 DIAGNOSIS — Z888 Allergy status to other drugs, medicaments and biological substances status: Secondary | ICD-10-CM | POA: Insufficient documentation

## 2020-02-08 DIAGNOSIS — Z881 Allergy status to other antibiotic agents status: Secondary | ICD-10-CM | POA: Diagnosis not present

## 2020-02-08 DIAGNOSIS — E7800 Pure hypercholesterolemia, unspecified: Secondary | ICD-10-CM | POA: Diagnosis not present

## 2020-02-08 DIAGNOSIS — Z7984 Long term (current) use of oral hypoglycemic drugs: Secondary | ICD-10-CM | POA: Insufficient documentation

## 2020-02-08 HISTORY — PX: PERIPHERAL VASCULAR BALLOON ANGIOPLASTY: CATH118281

## 2020-02-08 HISTORY — PX: ABDOMINAL AORTOGRAM W/LOWER EXTREMITY: CATH118223

## 2020-02-08 HISTORY — PX: PERIPHERAL VASCULAR ATHERECTOMY: CATH118256

## 2020-02-08 LAB — GLUCOSE, CAPILLARY
Glucose-Capillary: 172 mg/dL — ABNORMAL HIGH (ref 70–99)
Glucose-Capillary: 145 mg/dL — ABNORMAL HIGH (ref 70–99)

## 2020-02-08 LAB — POCT ACTIVATED CLOTTING TIME
Activated Clotting Time: 164 seconds
Activated Clotting Time: 279 seconds

## 2020-02-08 SURGERY — ABDOMINAL AORTOGRAM W/LOWER EXTREMITY
Anesthesia: LOCAL

## 2020-02-08 MED ORDER — LIDOCAINE HCL (PF) 1 % IJ SOLN
INTRAMUSCULAR | Status: DC | PRN
  Administered 2020-02-08: 15 mL via INTRADERMAL

## 2020-02-08 MED ORDER — HEPARIN SODIUM (PORCINE) 1000 UNIT/ML IJ SOLN
INTRAMUSCULAR | Status: AC
  Filled 2020-02-08: qty 1

## 2020-02-08 MED ORDER — LABETALOL HCL 5 MG/ML IV SOLN: 10.0000 mg | INTRAVENOUS | Status: DC | PRN

## 2020-02-08 MED ORDER — LIDOCAINE HCL (PF) 1 % IJ SOLN
INTRAMUSCULAR | Status: AC
  Filled 2020-02-08: qty 30

## 2020-02-08 MED ORDER — HEPARIN (PORCINE) IN NACL 1000-0.9 UT/500ML-% IV SOLN
INTRAVENOUS | Status: DC | PRN
  Administered 2020-02-08 (×2): 500 mL

## 2020-02-08 MED ORDER — ONDANSETRON HCL 4 MG/2ML IJ SOLN: 4.0000 mg | Freq: Four times a day (QID) | INTRAMUSCULAR | Status: DC | PRN

## 2020-02-08 MED ORDER — FENTANYL CITRATE (PF) 100 MCG/2ML IJ SOLN
INTRAMUSCULAR | Status: DC | PRN
  Administered 2020-02-08: 25 ug via INTRAVENOUS

## 2020-02-08 MED ORDER — SODIUM CHLORIDE 0.9 % IV SOLN: 250.0000 mL | INTRAVENOUS | Status: DC | PRN

## 2020-02-08 MED ORDER — IODIXANOL 320 MG/ML IV SOLN
INTRAVENOUS | Status: DC | PRN
  Administered 2020-02-08: 90 mL via INTRA_ARTERIAL

## 2020-02-08 MED ORDER — SODIUM CHLORIDE 0.9% FLUSH: 3.0000 mL | Freq: Two times a day (BID) | INTRAVENOUS | Status: DC

## 2020-02-08 MED ORDER — MIDAZOLAM HCL 2 MG/2ML IJ SOLN
INTRAMUSCULAR | Status: DC | PRN
  Administered 2020-02-08: 1 mg via INTRAVENOUS

## 2020-02-08 MED ORDER — HEPARIN SODIUM (PORCINE) 1000 UNIT/ML IJ SOLN
INTRAMUSCULAR | Status: DC | PRN
  Administered 2020-02-08: 2000 [IU] via INTRAVENOUS
  Administered 2020-02-08: 4000 [IU] via INTRAVENOUS

## 2020-02-08 MED ORDER — SODIUM CHLORIDE 0.9% FLUSH: 3.0000 mL | INTRAVENOUS | Status: DC | PRN

## 2020-02-08 MED ORDER — SODIUM CHLORIDE 0.9 % WEIGHT BASED INFUSION: 1.0000 mL/kg/h | INTRAVENOUS | Status: DC

## 2020-02-08 MED ORDER — ACETAMINOPHEN 325 MG PO TABS: 650.0000 mg | ORAL_TABLET | ORAL | Status: DC | PRN

## 2020-02-08 MED ORDER — VIPERSLIDE LUBRICANT OPTIME
TOPICAL | Status: DC | PRN
  Administered 2020-02-08: 20 mL via SURGICAL_CAVITY

## 2020-02-08 MED ORDER — ASPIRIN 81 MG PO CHEW: 81.0000 mg | CHEWABLE_TABLET | ORAL | Status: DC

## 2020-02-08 MED ORDER — NITROGLYCERIN 1 MG/10 ML FOR IR/CATH LAB
INTRA_ARTERIAL | Status: DC | PRN
  Administered 2020-02-08 (×2): 200 ug via INTRA_ARTERIAL

## 2020-02-08 MED ORDER — FENTANYL CITRATE (PF) 100 MCG/2ML IJ SOLN
INTRAMUSCULAR | Status: AC
  Filled 2020-02-08: qty 2

## 2020-02-08 MED ORDER — SODIUM CHLORIDE 0.9 % WEIGHT BASED INFUSION
3.0000 mL/kg/h | INTRAVENOUS | Status: AC
  Administered 2020-02-08: 3 mL/kg/h via INTRAVENOUS

## 2020-02-08 MED ORDER — HEPARIN (PORCINE) IN NACL 1000-0.9 UT/500ML-% IV SOLN
INTRAVENOUS | Status: AC
  Filled 2020-02-08: qty 1000

## 2020-02-08 MED ORDER — SODIUM CHLORIDE 0.9 % IV SOLN: INTRAVENOUS | Status: AC

## 2020-02-08 MED ORDER — MIDAZOLAM HCL 5 MG/5ML IJ SOLN
INTRAMUSCULAR | Status: AC
  Filled 2020-02-08: qty 5

## 2020-02-08 SURGICAL SUPPLY — 24 items
BAG SNAP BAND KOVER 36X36 (MISCELLANEOUS) ×1 IMPLANT
CATH ANGIO 5F PIGTAIL 65CM (CATHETERS) ×1 IMPLANT
CATH CROSS OVER TEMPO 5F (CATHETERS) ×1 IMPLANT
CATH STRAIGHT 5FR 65CM (CATHETERS) ×1 IMPLANT
COVER DOME SNAP 22 D (MISCELLANEOUS) ×1 IMPLANT
DCB RANGER 5.0X60 135 (BALLOONS) IMPLANT
DEVICE CLOSURE MYNXGRIP 6/7F (Vascular Products) ×1 IMPLANT
DIAMONDBACK SOLID OAS 2.0MM (CATHETERS) ×3
KIT ENCORE 26 ADVANTAGE (KITS) ×1 IMPLANT
KIT MICROPUNCTURE NIT STIFF (SHEATH) ×1 IMPLANT
KIT PV (KITS) ×3 IMPLANT
LUBRICANT VIPERSLIDE CORONARY (MISCELLANEOUS) ×1 IMPLANT
RANGER DCB 5.0X60 135 (BALLOONS) ×3
SHEATH PINNACLE 5F 10CM (SHEATH) ×1 IMPLANT
SHEATH PINNACLE 6F 10CM (SHEATH) ×1 IMPLANT
SHEATH PINNACLE ST 6F 45CM (SHEATH) ×1 IMPLANT
SHEATH PROBE COVER 6X72 (BAG) ×1 IMPLANT
SYR MEDRAD MARK 7 150ML (SYRINGE) ×3 IMPLANT
SYSTEM DIMNDBCK SLD OAS 2.0MM (CATHETERS) IMPLANT
TRANSDUCER W/STOPCOCK (MISCELLANEOUS) ×3 IMPLANT
TRAY PV CATH (CUSTOM PROCEDURE TRAY) ×3 IMPLANT
TUBING INJECTOR 48 (MISCELLANEOUS) ×1 IMPLANT
WIRE HITORQ VERSACORE ST 145CM (WIRE) ×1 IMPLANT
WIRE VIPER ADVANCE .017X335CM (WIRE) ×1 IMPLANT

## 2020-02-08 NOTE — Discharge Instructions (Signed)
Angiogram, Care After This sheet gives you information about how to care for yourself after your procedure. Your health care provider may also give you more specific instructions. If you have problems or questions, contact your health care provider. What can I expect after the procedure? After the procedure, it is common to have bruising and tenderness at the catheter insertion area. Follow these instructions at home: Insertion site care  Follow instructions from your health care provider about how to take care of your insertion site. Make sure you: ? Wash your hands with soap and water before you change your bandage (dressing). If soap and water are not available, use hand sanitizer. ? Change your dressing as told by your health care provider. ? Leave stitches (sutures), skin glue, or adhesive strips in place. These skin closures may need to stay in place for 2 weeks or longer. If adhesive strip edges start to loosen and curl up, you may trim the loose edges. Do not remove adhesive strips completely unless your health care provider tells you to do that.  Do not take baths, swim, or use a hot tub until your health care provider approves.  You may shower 24-48 hours after the procedure or as told by your health care provider. ? Gently wash the site with plain soap and water. ? Pat the area dry with a clean towel. ? Do not rub the site. This may cause bleeding.  Do not apply powder or lotion to the site. Keep the site clean and dry.  Check your insertion site every day for signs of infection. Check for: ? Redness, swelling, or pain. ? Fluid or blood. ? Warmth. ? Pus or a bad smell. Activity  Rest as told by your health care provider, usually for 1-2 days.  Do not lift anything that is heavier than 10 lbs. (4.5 kg) or as told by your health care provider.  Do not drive for 24 hours if you were given a medicine to help you relax (sedative).  Do not drive or use heavy machinery while  taking prescription pain medicine. General instructions   Return to your normal activities as told by your health care provider, usually in about a week. Ask your health care provider what activities are safe for you.  If the catheter site starts bleeding, lie flat and put pressure on the site. If the bleeding does not stop, get help right away. This is a medical emergency.  Drink enough fluid to keep your urine clear or pale yellow. This helps flush the contrast dye from your body.  Take over-the-counter and prescription medicines only as told by your health care provider.  Keep all follow-up visits as told by your health care provider. This is important. Contact a health care provider if:  You have a fever or chills.  You have redness, swelling, or pain around your insertion site.  You have fluid or blood coming from your insertion site.  The insertion site feels warm to the touch.  You have pus or a bad smell coming from your insertion site.  You have bruising around the insertion site.  You notice blood collecting in the tissue around the catheter site (hematoma). The hematoma may be painful to the touch. Get help right away if:  You have severe pain at the catheter insertion area.  The catheter insertion area swells very fast.  The catheter insertion area is bleeding, and the bleeding does not stop when you hold steady pressure on the area.    The area near or just beyond the catheter insertion site becomes pale, cool, tingly, or numb. These symptoms may represent a serious problem that is an emergency. Do not wait to see if the symptoms will go away. Get medical help right away. Call your local emergency services (911 in the [place] Do not drive yourself to the hospital. Summary  After the procedure, it is common to have bruising and tenderness at the catheter insertion area.  After the procedure, it is important to rest and drink plenty of fluids.  Do not take baths,  swim, or use a hot tub until your health care provider says it is okay to do so. You may shower 24-48 hours after the procedure or as told by your health care provider.  If the catheter site starts bleeding, lie flat and put pressure on the site. If the bleeding does not stop, get help right away. This is a medical emergency. This information is not intended to replace advice given to you by your health care provider. Make sure you discuss any questions you have with your health care provider. Document Revised: 06/12/2017 Document Reviewed: 06/04/2016 Elsevier Patient Education  2020 [employer]  

## 2020-02-08 NOTE — Interval H&P Note (Signed)
History and Physical Interval Note:  02/08/2020 8:38 AM  Melissa Elliott  has presented today for surgery, with the diagnosis of pad.  The various methods of treatment have been discussed with the patient and family. After consideration of risks, benefits and other options for treatment, the patient has consented to  Procedure(s): ABDOMINAL AORTOGRAM W/LOWER EXTREMITY (N/A) as a surgical intervention.  The patient's history has been reviewed, patient examined, no change in status, stable for surgery.  I have reviewed the patient's chart and labs.  Questions were answered to the patient's satisfaction.     Kathlyn Sacramento

## 2020-02-09 ENCOUNTER — Encounter (HOSPITAL_COMMUNITY): Payer: Self-pay | Admitting: Cardiovascular Disease

## 2020-02-10 ENCOUNTER — Telehealth: Payer: Self-pay | Admitting: Cardiovascular Disease

## 2020-02-10 NOTE — Telephone Encounter (Signed)
  Patient recently had peripheral vascular cath on 02/08/20 and she is calling because she is having bruising and tenderness around the site. She was told to call the office is she experienced bruising. Please call

## 2020-02-10 NOTE — Telephone Encounter (Signed)
Patient called with concern due to having discomfort and swelling at her injection site since her vascular cath on 7/28. She states the bruising is more than before as well. Referenced to call from [place] at 1030, transferred call to [place].

## 2020-02-10 NOTE — Telephone Encounter (Signed)
Returned the call to the patient. She stated that her site from her angiography on Wednesday is tender and there is some slight bruising. She has been advised that this is normal but she should keep an eye on it. She denies drainage. She has been advised that if she develops a lump at the site, a fever or any drainage that she should call.

## 2020-02-10 NOTE — Telephone Encounter (Signed)
The patient called in with concerns about swelling about her site. She stated that the site was tender.   Call placed to Dr. Fletcher Anon. She did have a small hematoma. He has advised that she watch it over the weekend and call back on Monday if it has grown. She has also been advised that she can call the on call provider this weekend.

## 2020-02-13 ENCOUNTER — Ambulatory Visit (HOSPITAL_COMMUNITY)
Admission: RE | Admit: 2020-02-13 | Discharge: 2020-02-13 | Disposition: A | Discharge status: Home / Self Care | Payer: Medicare HMO | Source: Ambulatory Visit | Attending: Cardiology | Admitting: Cardiology

## 2020-02-13 ENCOUNTER — Other Ambulatory Visit: Payer: Self-pay

## 2020-02-13 ENCOUNTER — Telehealth: Payer: Self-pay | Admitting: Cardiovascular Disease

## 2020-02-13 DIAGNOSIS — G8918 Other acute postprocedural pain: Secondary | ICD-10-CM | POA: Diagnosis not present

## 2020-02-13 DIAGNOSIS — I739 Peripheral vascular disease, unspecified: Secondary | ICD-10-CM

## 2020-02-13 NOTE — Telephone Encounter (Signed)
Returned the call to the patient. She stated that she feels like the lump and swelling are worse from the conversation on Friday. She also stated that the pain has gotten worse.   She will come in today at 1:45 for a groin pseudoaneurysm.

## 2020-02-13 NOTE — Telephone Encounter (Signed)
Follow Up:   Pt called on Friday and talked to [place]. Pt says she is still having some swelling,hurting in her leg. She also have a bruise on that leg. Please call to advise.

## 2020-02-15 DIAGNOSIS — H353131 Nonexudative age-related macular degeneration, bilateral, early dry stage: Secondary | ICD-10-CM | POA: Diagnosis not present

## 2020-02-15 DIAGNOSIS — H2513 Age-related nuclear cataract, bilateral: Secondary | ICD-10-CM | POA: Diagnosis not present

## 2020-02-28 ENCOUNTER — Ambulatory Visit (HOSPITAL_COMMUNITY)
Admission: RE | Admit: 2020-02-28 | Discharge: 2020-02-28 | Disposition: A | Discharge status: Home / Self Care | Payer: Medicare HMO | Source: Ambulatory Visit | Attending: Cardiovascular Disease | Admitting: Cardiovascular Disease

## 2020-02-28 ENCOUNTER — Other Ambulatory Visit: Payer: Self-pay

## 2020-02-28 ENCOUNTER — Other Ambulatory Visit (HOSPITAL_COMMUNITY): Payer: Self-pay | Admitting: Cardiovascular Disease

## 2020-02-28 DIAGNOSIS — I739 Peripheral vascular disease, unspecified: Secondary | ICD-10-CM

## 2020-03-06 ENCOUNTER — Ambulatory Visit: Payer: Medicare HMO | Admitting: Cardiovascular Disease

## 2020-03-06 ENCOUNTER — Encounter: Payer: Self-pay | Admitting: Cardiovascular Disease

## 2020-03-06 ENCOUNTER — Other Ambulatory Visit: Payer: Self-pay

## 2020-03-06 VITALS — BP 150/64 | HR 73 | Ht 64.0 in | Wt 120.0 lb

## 2020-03-06 DIAGNOSIS — I739 Peripheral vascular disease, unspecified: Secondary | ICD-10-CM

## 2020-03-06 DIAGNOSIS — I10 Essential (primary) hypertension: Secondary | ICD-10-CM | POA: Diagnosis not present

## 2020-03-06 DIAGNOSIS — E785 Hyperlipidemia, unspecified: Secondary | ICD-10-CM | POA: Diagnosis not present

## 2020-03-06 DIAGNOSIS — I2510 Atherosclerotic heart disease of native coronary artery without angina pectoris: Secondary | ICD-10-CM | POA: Diagnosis not present

## 2020-03-06 NOTE — Progress Notes (Signed)
Cardiology Office Note   Date:  03/06/2020   ID:  Melissa Elliott, DOB November 08, 1940, MRN [id number]  PCP:  Melissa Low, MD  Cardiologist: Dr. Irish Lack  No chief complaint on file.     History of Present Illness: Melissa Elliott is a 79 y.o. female who is here today for follow-up visit regarding peripheral arterial disease.   She has known history of coronary artery disease status post CABG in 1996 with subsequent stenting.  She has chronic medical conditions that include hyperlipidemia, stomach ulcers, prediabetes, essential hypertension and migraines. She is a very active person and exercises on a regular basis.  She was seen recently for progressive left calf claudication which significantly affected her ability to exercise. She underwent vascular studies in March which showed an ABI of 0.94 on the right and 0.79 on the left.  Duplex showed severe distal left SFA stenosis with peak velocity of 461.  Symptoms did not improve with the walking program and thus I proceeded with angiography in July which showed no significant aortoiliac disease, there was severe stenosis in the distal left SFA with two-vessel runoff below the knee.  I performed successful orbital atherectomy and drug-coated balloon angioplasty to the distal left SFA. She had some discomfort in the right groin access site.  Ultrasound showed no evidence of pseudoaneurysm but there was small hematoma.  Postprocedure vascular studies showed improvement in ABI to normal on the left.  Duplex showed patent distal left SFA.  There was moderately increased velocity in the mid SFA. She has been doing well with complete resolution of claudication.  She has minimal discomfort in the right groin area.  No chest pain or shortness of breath.  Past Medical History:  Diagnosis Date  . Asthma   . Complication of anesthesia    "takes a long time to wakeup; sometimes days" (05/11/2018)  . Coronary artery disease   . H/O hiatal hernia    . Heart murmur   . High cholesterol    "on RX because of the bypass" (02/01/2013)  . History of duodenal ulcer   . Hot flashes    "on a regular basis" (05/11/2018)  . Hypertension   . Hypoglycemia    "that's something I have to watch constantly" (02/01/2013)  . Migraine    "probably stopped w/menses" (05/11/2018)  . Pneumonia ? 1970's   "once" (05/11/2018)  . Sciatic nerve pain 12/2012   "RLE; just had it once" (05/11/2018)    Past Surgical History:  Procedure Laterality Date  . ABDOMINAL AORTOGRAM W/LOWER EXTREMITY N/A 02/08/2020   Procedure: ABDOMINAL AORTOGRAM W/LOWER EXTREMITY;  Surgeon: Wellington Hampshire, MD;  Location: [place] [facility];  Service: Cardiovascular;  Laterality: N/A;  . APPENDECTOMY  1970  . BREAST BIOPSY Left    benign  . COLONOSCOPY WITH PROPOFOL N/A 06/19/2015   Procedure: COLONOSCOPY WITH PROPOFOL;  Surgeon: Garlan Fair, MD;  Location: [facility];  Service: Endoscopy;  Laterality: N/A;  . CORONARY ANGIOPLASTY WITH STENT PLACEMENT  05/11/2018  . CORONARY ANGIOPLASTY WITH STENT PLACEMENT  1997  . CORONARY ARTERY BYPASS GRAFT  ~ 1997   CABG X2  . CORONARY STENT INTERVENTION N/A 05/11/2018   Procedure: CORONARY STENT INTERVENTION;  Surgeon: Jettie Booze, MD;  Location: [place] [facility];  Service: Cardiovascular;  Laterality: N/A;  . ESOPHAGOGASTRODUODENOSCOPY N/A 02/02/2013   Procedure: ESOPHAGOGASTRODUODENOSCOPY (EGD);  Surgeon: Wonda Horner, MD;  Location: [facility];  Service: Endoscopy;  Laterality: N/A;  . LEFT  HEART CATH AND CORS/GRAFTS ANGIOGRAPHY N/A 05/11/2018   Procedure: LEFT HEART CATH AND CORS/GRAFTS ANGIOGRAPHY;  Surgeon: Jettie Booze, MD;  Location: [place] [facility];  Service: Cardiovascular;  Laterality: N/A;  . LEFT HEART CATH AND CORS/GRAFTS ANGIOGRAPHY N/A 05/14/2018   Procedure: LEFT HEART CATH AND CORS/GRAFTS ANGIOGRAPHY;  Surgeon: Jettie Booze, MD;  Location: [place] [facility];  Service: Cardiovascular;   Laterality: N/A;  . PERIPHERAL VASCULAR ATHERECTOMY Left 02/08/2020   Procedure: PERIPHERAL VASCULAR ATHERECTOMY;  Surgeon: Wellington Hampshire, MD;  Location: [place] [facility];  Service: Cardiovascular;  Laterality: Left;  sfa (distal)  . PERIPHERAL VASCULAR BALLOON ANGIOPLASTY Left 02/08/2020   Procedure: PERIPHERAL VASCULAR BALLOON ANGIOPLASTY;  Surgeon: Wellington Hampshire, MD;  Location: [place] [facility];  Service: Cardiovascular;  Laterality: Left;  sfa (distal)  . SKIN LESION EXCISION Left 1960's   "upper arm; sore that never healed" (02/01/2013)  . TONSILLECTOMY  1940's     Current Outpatient Medications  Medication Sig Dispense Refill  . ADVAIR DISKUS 250-50 MCG/DOSE AEPB Inhale 1 puff into the lungs 2 (two) times daily. Only uses in winter and spring months  11  . albuterol (PROVENTIL HFA;VENTOLIN HFA) 108 (90 Base) MCG/ACT inhaler Inhale 2 puffs into the lungs every 6 (six) hours as needed for wheezing. 1 Inhaler 3  . aspirin (ASPIRIN EC) 81 MG EC tablet Take 81 mg by mouth daily. Swallow whole.    . Calcium Carbonate-Vitamin D (CALCIUM-D) 600-400 MG-UNIT TABS Take 1 tablet by mouth daily in the afternoon.     . cholecalciferol (VITAMIN D) 1000 units tablet Take 1,000 Units by mouth daily with lunch.    . Cholecalciferol (VITAMIN D3) 50 MCG (2000 UT) TABS Take 2,000 Units by mouth daily in the afternoon.    . clopidogrel (PLAVIX) 75 MG tablet TAKE 1 TABLET (75 MG TOTAL) BY MOUTH DAILY WITH BREAKFAST. 90 tablet 3  . diphenhydrAMINE (BENADRYL) 50 MG tablet Take one 50 mg tablet the morning of the procedure with the last dose of the prednisone. 1 tablet 0  . fexofenadine (ALLEGRA) 180 MG tablet Take 180 mg by mouth daily.     . irbesartan (AVAPRO) 150 MG tablet Take 1 tablet (150 mg total) by mouth daily. (Patient taking differently: Take 150 mg by mouth every evening. ) 90 tablet 3  . metFORMIN (GLUCOPHAGE) 500 MG tablet Take 500 mg by mouth daily. with food  1  . metoprolol succinate  (TOPROL-XL) 50 MG 24 hr tablet Take 50 mg by mouth daily.     . montelukast (SINGULAIR) 10 MG tablet Take 10 mg by mouth at bedtime.    . Multiple Vitamins-Minerals (PRESERVISION AREDS 2) CAPS Take 1 capsule by mouth 2 (two) times daily.    . nitroGLYCERIN (NITROSTAT) 0.4 MG SL tablet Place 0.4 mg under the tongue every 5 (five) minutes as needed for chest pain (UP TO 3 DOSES BEFORE CALLING EMS).    . simvastatin (ZOCOR) 40 MG tablet Take 40 mg by mouth at bedtime.      No current facility-administered medications for this visit.    Allergies:   Ciprofloxacin, Sulfa antibiotics, Iodine, Eggs or egg-derived products, and Penicillins    Social History:  The patient  reports that she has never smoked. She has never used smokeless tobacco. She reports current alcohol use. She reports that she does not use drugs.   Family History:  The patient's family history includes Asthma in her mother; Breast cancer in her  paternal aunt; CAD in her mother; Heart attack in her father; Hypertension in her father, maternal grandfather, maternal grandmother, and mother.    ROS:  Please see the history of present illness.   Otherwise, review of systems are positive for none.   All other systems are reviewed and negative.    PHYSICAL EXAM: VS:  BP (!) 150/64   Pulse 73   Ht 5\' 4"  (1.626 m)   Wt 120 lb (54.4 kg)   BMI 20.60 kg/m  , BMI Body mass index is 20.6 kg/m. GEN: Well nourished, well developed, in no acute distress  HEENT: normal  Neck: no JVD, carotid bruits, or masses Cardiac: RRR; no murmurs, rubs, or gallops,no edema  Respiratory:  clear to auscultation bilaterally, normal work of breathing GI: soft, nontender, nondistended, + BS MS: no deformity or atrophy  Skin: warm and dry, no rash Neuro:  Strength and sensation are intact Psych: euthymic mood, full affect Vascular: Femoral pulses slightly diminished bilaterally.  Distal pulses are palpable on the left with diminished.  There is a small  knot in the right groin likely related to the Mynx closure device but no hematoma or bruit.   EKG:  EKG is not ordered today.    Recent Labs: 02/02/2020: BUN 17; Creatinine, Ser 1.11; Hemoglobin 12.2; Platelets 289; Potassium 5.1; Sodium 142    Lipid Panel    Component Value Date/Time   CHOL 157 05/14/2018 0203   TRIG 120 05/14/2018 0203   HDL 52 05/14/2018 0203   CHOLHDL 3.0 05/14/2018 0203   VLDL 24 05/14/2018 0203   LDLCALC 81 05/14/2018 0203      Wt Readings from Last 3 Encounters:  03/06/20 120 lb (54.4 kg)  02/08/20 121 lb (54.9 kg)  01/10/20 124 lb 6.4 oz (56.4 kg)        No flowsheet data found.    ASSESSMENT AND PLAN:  1.  Peripheral arterial disease: Status post recent endovascular intervention on left SFA for severe claudication.  ABI improved to normal and claudication resolved.  She is already on dual antiplatelet therapy.  Repeat vascular studies in 6 months.    2.  Coronary artery disease involving native coronary arteries without angina: She is overall doing well.  She is on dual antiplatelet therapy.  3.  Essential hypertension: Blood pressure is mildly elevated but usually is well controlled.  4.  Hyperlipidemia: Currently on simvastatin 40 mg daily.  Recommend a target LDL of less than 70.   Disposition:   FU with me in 6 months  Signed,  Kathlyn Sacramento, MD  03/06/2020 10:55 AM    [place]

## 2020-03-06 NOTE — Patient Instructions (Signed)

## 2020-06-04 ENCOUNTER — Other Ambulatory Visit: Payer: Self-pay | Admitting: Internal Medicine

## 2020-06-04 DIAGNOSIS — Z1231 Encounter for screening mammogram for malignant neoplasm of breast: Secondary | ICD-10-CM

## 2020-06-26 DIAGNOSIS — D485 Neoplasm of uncertain behavior of skin: Secondary | ICD-10-CM | POA: Diagnosis not present

## 2020-06-26 DIAGNOSIS — D2261 Melanocytic nevi of right upper limb, including shoulder: Secondary | ICD-10-CM | POA: Diagnosis not present

## 2020-06-26 DIAGNOSIS — L578 Other skin changes due to chronic exposure to nonionizing radiation: Secondary | ICD-10-CM | POA: Diagnosis not present

## 2020-06-26 DIAGNOSIS — Z86018 Personal history of other benign neoplasm: Secondary | ICD-10-CM | POA: Diagnosis not present

## 2020-07-31 ENCOUNTER — Ambulatory Visit: Payer: Medicare HMO

## 2020-08-03 ENCOUNTER — Other Ambulatory Visit: Payer: Self-pay

## 2020-08-03 ENCOUNTER — Ambulatory Visit
Admission: RE | Admit: 2020-08-03 | Discharge: 2020-08-03 | Disposition: A | Discharge status: Home / Self Care | Payer: Medicare HMO | Source: Ambulatory Visit | Attending: Internal Medicine | Admitting: Internal Medicine

## 2020-08-03 DIAGNOSIS — Z1231 Encounter for screening mammogram for malignant neoplasm of breast: Secondary | ICD-10-CM

## 2020-09-25 ENCOUNTER — Encounter (HOSPITAL_COMMUNITY): Payer: Medicare HMO

## 2020-09-27 ENCOUNTER — Ambulatory Visit: Payer: Medicare HMO | Admitting: Interventional Cardiology

## 2020-09-30 NOTE — Progress Notes (Signed)
Cardiology Office Note   Date:  10/01/2020   ID:  Melissa Elliott, DOB [birth year], MRN [id number]  PCP:  Wenda Low, MD    No chief complaint on file.  CAD  Wt Readings from Last 3 Encounters:  10/01/20 124 lb (56.2 kg)  03/06/20 120 lb (54.4 kg)  02/08/20 121 lb (54.9 kg)       History of Present Illness: Melissa Elliott is a 80 y.o. female  with CAD. 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   She had several stomach ulcers several years ago; likely secondary to meloxicam. Not taking NSAIDs now.  She hadoutpatient cardiac catheterization which was done on 05/11/2018 which showed a patent LIMA to LAD, occluded SVG to OM. CTO of mid RCA with distal collaterals. Patient had a significant bifurcation lesion at obtuse marginal branch treated with drug-eluting stent x2. Postprocedure, patient was placed on aspirin and Plavix. RCA was managed medically. She was discharged on 05/12/2018. She initially felt well, however by the following day, she started having significant fatigue and intermittent left-sided chest pain up to1 to 2 minutes each. She did not have any chest pain prior to the recent cardiac catheterization and this was something new. [facility] didnot show significant ST changes, didshow some nonspecific T wave changes. Initial point-of-care troponin is 2.66 and trended down.  Repeat cath showed widely patent culotte stents in the OM territory.It was felt that the elevated troponin was likely from the complex bifurcation intervention done several days prior.  She was seen back for post hospital f/u on 05/20/18 and noted symptoms of dizziness and hear syncope. Worse with positional changes. Her BP was low, in the low 937T systolic. Orthostatic VS were +. Her irbesartan was reduced down from 300 mg daily to 150 mg daily. She was instructed to monitor BP at home and to return in 2  weeks to reassess symptoms and BP.Sx improved.  She had a precancerous lesion removed from the left leg.  In 2020, she noted a burning sensation in her left calf with walking. Initially it started after 0.25 mile, now it is at 0.5 miles where it starts.  No nonhealing sores.  Not related to the skin procedure.  Records from Dr. Fletcher Anon show: "orbital atherectomy and drug-coated balloon angioplasty to the distal left SFA. She had some discomfort in the right groin access site.  Ultrasound showed no evidence of pseudoaneurysm but there was small hematoma.  Postprocedure vascular studies showed improvement in ABI to normal on the left.  Duplex showed patent distal left SFA.  There was moderately increased velocity in the mid SFA." She had complete resolution of claudication.   Denies : Chest pain. Dizziness. Leg edema. Nitroglycerin use. Orthopnea. Palpitations. Paroxysmal nocturnal dyspnea. Shortness of breath. Syncope.   Now with sciatica related pain.  Walking is limited.    Past Medical History:  Diagnosis Date  . Asthma   . Complication of anesthesia    "takes a long time to wakeup; sometimes days" (05/11/2018)  . Coronary artery disease   . H/O hiatal hernia   . Heart murmur   . High cholesterol    "on RX because of the bypass" (02/01/2013)  . History of duodenal ulcer   . Hot flashes    "on a regular basis" (05/11/2018)  . Hypertension   . Hypoglycemia    "that's something I have to watch constantly" (02/01/2013)  . Migraine    "probably stopped  w/menses" (05/11/2018)  . Pneumonia ? 1970's   "once" (05/11/2018)  . Sciatic nerve pain 12/2012   "RLE; just had it once" (05/11/2018)    Past Surgical History:  Procedure Laterality Date  . ABDOMINAL AORTOGRAM W/LOWER EXTREMITY N/A 02/08/2020   Procedure: ABDOMINAL AORTOGRAM W/LOWER EXTREMITY;  Surgeon: Wellington Hampshire, MD;  Location: [place] [facility];  Service: Cardiovascular;  Laterality: N/A;  . APPENDECTOMY  1970  .  BREAST BIOPSY Left    benign  . COLONOSCOPY WITH PROPOFOL N/A 06/19/2015   Procedure: COLONOSCOPY WITH PROPOFOL;  Surgeon: Garlan Fair, MD;  Location: [facility];  Service: Endoscopy;  Laterality: N/A;  . CORONARY ANGIOPLASTY WITH STENT PLACEMENT  05/11/2018  . CORONARY ANGIOPLASTY WITH STENT PLACEMENT  1997  . CORONARY ARTERY BYPASS GRAFT  ~ 1997   CABG X2  . CORONARY STENT INTERVENTION N/A 05/11/2018   Procedure: CORONARY STENT INTERVENTION;  Surgeon: Jettie Booze, MD;  Location: [place] [facility];  Service: Cardiovascular;  Laterality: N/A;  . ESOPHAGOGASTRODUODENOSCOPY N/A 02/02/2013   Procedure: ESOPHAGOGASTRODUODENOSCOPY (EGD);  Surgeon: Wonda Horner, MD;  Location: [facility]COPY;  Service: Endoscopy;  Laterality: N/A;  . LEFT HEART CATH AND CORS/GRAFTS ANGIOGRAPHY N/A 05/11/2018   Procedure: LEFT HEART CATH AND CORS/GRAFTS ANGIOGRAPHY;  Surgeon: Jettie Booze, MD;  Location: [place] [facility];  Service: Cardiovascular;  Laterality: N/A;  . LEFT HEART CATH AND CORS/GRAFTS ANGIOGRAPHY N/A 05/14/2018   Procedure: LEFT HEART CATH AND CORS/GRAFTS ANGIOGRAPHY;  Surgeon: Jettie Booze, MD;  Location: [place] [facility];  Service: Cardiovascular;  Laterality: N/A;  . PERIPHERAL VASCULAR ATHERECTOMY Left 02/08/2020   Procedure: PERIPHERAL VASCULAR ATHERECTOMY;  Surgeon: Wellington Hampshire, MD;  Location: [place] [facility];  Service: Cardiovascular;  Laterality: Left;  sfa (distal)  . PERIPHERAL VASCULAR BALLOON ANGIOPLASTY Left 02/08/2020   Procedure: PERIPHERAL VASCULAR BALLOON ANGIOPLASTY;  Surgeon: Wellington Hampshire, MD;  Location: [place] [facility];  Service: Cardiovascular;  Laterality: Left;  sfa (distal)  . SKIN LESION EXCISION Left 1960's   "upper arm; sore that never healed" (02/01/2013)  . TONSILLECTOMY  1940's     Current Outpatient Medications  Medication Sig Dispense Refill  . ADVAIR DISKUS 250-50 MCG/DOSE AEPB Inhale 1 puff into the lungs 2 (two)  times daily. Only uses in winter and spring months  11  . albuterol (PROVENTIL HFA;VENTOLIN HFA) 108 (90 Base) MCG/ACT inhaler Inhale 2 puffs into the lungs every 6 (six) hours as needed for wheezing. 1 Inhaler 3  . aspirin 81 MG EC tablet Take 81 mg by mouth daily. Swallow whole.    . Calcium Carbonate-Vitamin D (CALCIUM-D) 600-400 MG-UNIT TABS Take 1 tablet by mouth daily in the afternoon.     . cholecalciferol (VITAMIN D) 1000 units tablet Take 1,000 Units by mouth daily with lunch.    . clopidogrel (PLAVIX) 75 MG tablet TAKE 1 TABLET (75 MG TOTAL) BY MOUTH DAILY WITH BREAKFAST. 90 tablet 3  . fexofenadine (ALLEGRA) 180 MG tablet Take 180 mg by mouth daily.    . irbesartan (AVAPRO) 150 MG tablet Take 1 tablet (150 mg total) by mouth daily. 90 tablet 3  . metFORMIN (GLUCOPHAGE) 500 MG tablet Take 500 mg by mouth daily. with food  1  . metoprolol succinate (TOPROL-XL) 50 MG 24 hr tablet Take 50 mg by mouth daily.     . montelukast (SINGULAIR) 10 MG tablet Take 10 mg by mouth at bedtime.    . Multiple Vitamins-Minerals (PRESERVISION  AREDS 2) CAPS Take 1 capsule by mouth 2 (two) times daily.    . nitroGLYCERIN (NITROSTAT) 0.4 MG SL tablet Place 0.4 mg under the tongue every 5 (five) minutes as needed for chest pain (UP TO 3 DOSES BEFORE CALLING EMS).    . simvastatin (ZOCOR) 40 MG tablet Take 40 mg by mouth at bedtime.     Marland Kitchen tiZANidine (ZANAFLEX) 2 MG tablet Take by mouth every 6 (six) hours as needed for muscle spasms.     No current facility-administered medications for this visit.    Allergies:   Ciprofloxacin, Sulfa antibiotics, Iodine, Eggs or egg-derived products, and Penicillins    Social History:  The patient  reports that she has never smoked. She has never used smokeless tobacco. She reports current alcohol use. She reports that she does not use drugs.   Family History:  The patient's family history includes Asthma in her mother; Breast cancer in her paternal aunt; CAD in her mother;  Heart attack in her father; Hypertension in her father, maternal grandfather, maternal grandmother, and mother.    ROS:  Please see the history of present illness.   Otherwise, review of systems are positive for leg pain related to sciatica.   All other systems are reviewed and negative.    PHYSICAL EXAM: VS:  BP 130/80   Pulse 76   Ht 5\' 4"  (1.626 m)   Wt 124 lb (56.2 kg)   SpO2 97%   BMI 21.28 kg/m  , BMI Body mass index is 21.28 kg/m. GEN: Well nourished, well developed, in no acute distress  HEENT: normal  Neck: no JVD, carotid bruits, or masses Cardiac: RRR; no murmurs, rubs, or gallops,no edema  Respiratory:  clear to auscultation bilaterally, normal work of breathing GI: soft, nontender, nondistended, + BS MS: no deformity or atrophy  Skin: warm and dry, no rash Neuro:  Strength and sensation are intact Psych: euthymic mood, full affect   EKG:   The ekg ordered today demonstrates NSR, no ST changes   Recent Labs: 02/02/2020: BUN 17; Creatinine, Ser 1.11; Hemoglobin 12.2; Platelets 289; Potassium 5.1; Sodium 142   Lipid Panel    Component Value Date/Time   CHOL 157 05/14/2018 0203   TRIG 120 05/14/2018 0203   HDL 52 05/14/2018 0203   CHOLHDL 3.0 05/14/2018 0203   VLDL 24 05/14/2018 0203   LDLCALC 81 05/14/2018 0203     Other studies Reviewed: Additional studies/ records that were reviewed today with results demonstrating: PV records reviewed.     ASSESSMENT AND PLAN:  1. CAD: Status post CABG and PCI.  No angina.  Walking at a slower pace with her sciatica. Refill SL NTG as needed.  2. PAD: Status post PTCA of her left SFA.   3. Hypertension: Low-salt diet.  Avoid processed foods.  The current medical regimen is effective;  continue present plan and medications. 4. Hyperlipidemia: Whole food, plant-based diet recommended.   Current medicines are reviewed at length with the patient today.  The patient concerns regarding her medicines were addressed.  The  following changes have been made:  No change  Labs/ tests ordered today include:  No orders of the defined types were placed in this encounter.   Recommend 150 minutes/week of aerobic exercise Low fat, low carb, high fiber diet recommended  Disposition:   FU in 1 year   Signed, Larae Grooms, MD  10/01/2020 1:35 PM    [facility] [place], [place]  [address] Phone: [phone]; Fax: [phone]

## 2020-10-01 ENCOUNTER — Ambulatory Visit: Payer: Medicare HMO | Admitting: Interventional Cardiology

## 2020-10-01 ENCOUNTER — Encounter: Payer: Self-pay | Admitting: Interventional Cardiology

## 2020-10-01 ENCOUNTER — Other Ambulatory Visit: Payer: Self-pay

## 2020-10-01 VITALS — BP 130/80 | HR 76 | Ht 64.0 in | Wt 124.0 lb

## 2020-10-01 DIAGNOSIS — R7303 Prediabetes: Secondary | ICD-10-CM | POA: Diagnosis not present

## 2020-10-01 DIAGNOSIS — I25118 Atherosclerotic heart disease of native coronary artery with other forms of angina pectoris: Secondary | ICD-10-CM

## 2020-10-01 DIAGNOSIS — E782 Mixed hyperlipidemia: Secondary | ICD-10-CM | POA: Diagnosis not present

## 2020-10-01 DIAGNOSIS — I10 Essential (primary) hypertension: Secondary | ICD-10-CM

## 2020-10-01 DIAGNOSIS — I252 Old myocardial infarction: Secondary | ICD-10-CM | POA: Diagnosis not present

## 2020-10-01 DIAGNOSIS — I739 Peripheral vascular disease, unspecified: Secondary | ICD-10-CM

## 2020-10-01 NOTE — Patient Instructions (Signed)

## 2020-10-02 ENCOUNTER — Ambulatory Visit (HOSPITAL_COMMUNITY)
Admission: RE | Admit: 2020-10-02 | Discharge: 2020-10-02 | Disposition: A | Discharge status: Home / Self Care | Payer: Medicare HMO | Source: Ambulatory Visit | Attending: Cardiovascular Disease | Admitting: Cardiovascular Disease

## 2020-10-02 DIAGNOSIS — I739 Peripheral vascular disease, unspecified: Secondary | ICD-10-CM | POA: Diagnosis not present

## 2020-10-16 ENCOUNTER — Encounter: Payer: Self-pay | Admitting: Cardiovascular Disease

## 2020-10-16 ENCOUNTER — Ambulatory Visit: Payer: Medicare HMO | Admitting: Cardiovascular Disease

## 2020-10-16 ENCOUNTER — Other Ambulatory Visit: Payer: Self-pay

## 2020-10-16 VITALS — BP 142/72 | HR 89 | Ht 64.0 in | Wt 122.2 lb

## 2020-10-16 DIAGNOSIS — I739 Peripheral vascular disease, unspecified: Secondary | ICD-10-CM

## 2020-10-16 DIAGNOSIS — I2510 Atherosclerotic heart disease of native coronary artery without angina pectoris: Secondary | ICD-10-CM

## 2020-10-16 DIAGNOSIS — E785 Hyperlipidemia, unspecified: Secondary | ICD-10-CM

## 2020-10-16 DIAGNOSIS — I10 Essential (primary) hypertension: Secondary | ICD-10-CM

## 2020-10-16 NOTE — Patient Instructions (Signed)
Medication Instructions:  No changes  *If you need a refill on your cardiac medications before your next appointment, please call your pharmacy*   Lab Work: None ordered  If you have labs (blood work) drawn today and your tests are completely normal, you will receive your results only by: Marland Kitchen MyChart Message (if you have MyChart) OR . A paper copy in the mail If you have any lab test that is abnormal or we need to change your treatment, we will call you to review the results.   Testing/Procedures: Your physician has requested that you have an ankle brachial index (ABI) in 12 months. During this test an ultrasound and blood pressure cuff are used to evaluate the arteries that supply the arms and legs with blood. Allow thirty minutes for this exam. There are no restrictions or special instructions.  Your physician has requested that you have a lower extremity arterial duplex in 12 months. During this test, exercise and ultrasound are used to evaluate arterial blood flow in the legs. Allow one hour for this exam. There are no restrictions or special instructions.   Follow-Up: At [facility], you and your health needs are our priority.  As part of our continuing mission to provide you with exceptional heart care, we have created designated Provider Care Teams.  These Care Teams include your primary Cardiologist (physician) and Advanced Practice Providers (APPs -  Physician Assistants and Nurse Practitioners) who all work together to provide you with the care you need, when you need it.  We recommend signing up for the patient portal called "MyChart".  Sign up information is provided on this After Visit Summary.  MyChart is used to connect with patients for Virtual Visits (Telemedicine).  Patients are able to view lab/test results, encounter notes, upcoming appointments, etc.  Non-urgent messages can be sent to your provider as well.   To learn more about what you can do with MyChart, go to  [email or web address].    Your next appointment:   12 month(s)  The format for your next appointment:   In Person  Provider:   Kathlyn Sacramento, MD

## 2020-10-16 NOTE — Progress Notes (Signed)
Cardiology Office Note   Date:  10/16/2020   ID:  ARNISHA Elliott, DOB 1940-12-01, MRN [id number]  PCP:  Wenda Low, MD  Cardiologist: Dr. Irish Lack  No chief complaint on file.     History of Present Illness: Melissa Elliott is a 80 y.o. female who is here today for follow-up visit regarding peripheral arterial disease.   She has known history of coronary artery disease status post CABG in 1996 with subsequent stenting.  She has chronic medical conditions that include hyperlipidemia, stomach ulcers, prediabetes, essential hypertension and migraines. She is a very active person and exercises on a regular basis.  She was seen last year for progressive left calf claudication which significantly affected her ability to exercise. She underwent vascular studies in March which showed an ABI of 0.94 on the right and 0.79 on the left.  Duplex showed severe distal left SFA stenosis with peak velocity of 461.  Symptoms did not improve with the walking program and thus I proceeded with angiography in July, 2021 which showed no significant aortoiliac disease, there was severe stenosis in the distal left SFA with two-vessel runoff below the knee.  I performed successful orbital atherectomy and drug-coated balloon angioplasty to the distal left SFA.  She has been doing well with no recurrent claudication.  She is mostly bothered now by severe right-sided low back pain radiating to her right leg.  She did not improve with a muscle relaxer.   Past Medical History:  Diagnosis Date  . Asthma   . Complication of anesthesia    "takes a long time to wakeup; sometimes days" (05/11/2018)  . Coronary artery disease   . H/O hiatal hernia   . Heart murmur   . High cholesterol    "on RX because of the bypass" (02/01/2013)  . History of duodenal ulcer   . Hot flashes    "on a regular basis" (05/11/2018)  . Hypertension   . Hypoglycemia    "that's something I have to watch constantly" (02/01/2013)   . Migraine    "probably stopped w/menses" (05/11/2018)  . Pneumonia ? 1970's   "once" (05/11/2018)  . Sciatic nerve pain 12/2012   "RLE; just had it once" (05/11/2018)    Past Surgical History:  Procedure Laterality Date  . ABDOMINAL AORTOGRAM W/LOWER EXTREMITY N/A 02/08/2020   Procedure: ABDOMINAL AORTOGRAM W/LOWER EXTREMITY;  Surgeon: Wellington Hampshire, MD;  Location: [facility];  Service: Cardiovascular;  Laterality: N/A;  . APPENDECTOMY  1970  . BREAST BIOPSY Left    benign  . COLONOSCOPY WITH PROPOFOL N/A 06/19/2015   Procedure: COLONOSCOPY WITH PROPOFOL;  Surgeon: Garlan Fair, MD;  Location: [facility];  Service: Endoscopy;  Laterality: N/A;  . CORONARY ANGIOPLASTY WITH STENT PLACEMENT  05/11/2018  . CORONARY ANGIOPLASTY WITH STENT PLACEMENT  1997  . CORONARY ARTERY BYPASS GRAFT  ~ 1997   CABG X2  . CORONARY STENT INTERVENTION N/A 05/11/2018   Procedure: CORONARY STENT INTERVENTION;  Surgeon: Jettie Booze, MD;  Location: [facility];  Service: Cardiovascular;  Laterality: N/A;  . ESOPHAGOGASTRODUODENOSCOPY N/A 02/02/2013   Procedure: ESOPHAGOGASTRODUODENOSCOPY (EGD);  Surgeon: Wonda Horner, MD;  Location: [facility];  Service: Endoscopy;  Laterality: N/A;  . LEFT HEART CATH AND CORS/GRAFTS ANGIOGRAPHY N/A 05/11/2018   Procedure: LEFT HEART CATH AND CORS/GRAFTS ANGIOGRAPHY;  Surgeon: Jettie Booze, MD;  Location: [facility];  Service: Cardiovascular;  Laterality: N/A;  . LEFT HEART CATH AND CORS/GRAFTS  ANGIOGRAPHY N/A 05/14/2018   Procedure: LEFT HEART CATH AND CORS/GRAFTS ANGIOGRAPHY;  Surgeon: Jettie Booze, MD;  Location: [place] [facility];  Service: Cardiovascular;  Laterality: N/A;  . PERIPHERAL VASCULAR ATHERECTOMY Left 02/08/2020   Procedure: PERIPHERAL VASCULAR ATHERECTOMY;  Surgeon: Wellington Hampshire, MD;  Location: [place] [facility];  Service: Cardiovascular;  Laterality: Left;  sfa (distal)  . PERIPHERAL VASCULAR  BALLOON ANGIOPLASTY Left 02/08/2020   Procedure: PERIPHERAL VASCULAR BALLOON ANGIOPLASTY;  Surgeon: Wellington Hampshire, MD;  Location: [place] [facility];  Service: Cardiovascular;  Laterality: Left;  sfa (distal)  . SKIN LESION EXCISION Left 1960's   "upper arm; sore that never healed" (02/01/2013)  . TONSILLECTOMY  1940's     Current Outpatient Medications  Medication Sig Dispense Refill  . ADVAIR DISKUS 250-50 MCG/DOSE AEPB Inhale 1 puff into the lungs 2 (two) times daily. Only uses in winter and spring months  11  . albuterol (PROVENTIL HFA;VENTOLIN HFA) 108 (90 Base) MCG/ACT inhaler Inhale 2 puffs into the lungs every 6 (six) hours as needed for wheezing. 1 Inhaler 3  . aspirin 81 MG EC tablet Take 81 mg by mouth daily. Swallow whole.    . Calcium Carbonate-Vitamin D (CALCIUM-D) 600-400 MG-UNIT TABS Take 1 tablet by mouth daily in the afternoon.     . cholecalciferol (VITAMIN D) 1000 units tablet Take 1,000 Units by mouth daily with lunch.    . clopidogrel (PLAVIX) 75 MG tablet TAKE 1 TABLET (75 MG TOTAL) BY MOUTH DAILY WITH BREAKFAST. 90 tablet 3  . fexofenadine (ALLEGRA) 180 MG tablet Take 180 mg by mouth daily.    . irbesartan (AVAPRO) 150 MG tablet Take 1 tablet (150 mg total) by mouth daily. 90 tablet 3  . metFORMIN (GLUCOPHAGE) 500 MG tablet Take 500 mg by mouth daily. with food  1  . metoprolol succinate (TOPROL-XL) 50 MG 24 hr tablet Take 50 mg by mouth daily.     . montelukast (SINGULAIR) 10 MG tablet Take 10 mg by mouth at bedtime.    . Multiple Vitamins-Minerals (PRESERVISION AREDS 2) CAPS Take 1 capsule by mouth 2 (two) times daily.    . nitroGLYCERIN (NITROSTAT) 0.4 MG SL tablet Place 0.4 mg under the tongue every 5 (five) minutes as needed for chest pain (UP TO 3 DOSES BEFORE CALLING EMS).    . simvastatin (ZOCOR) 40 MG tablet Take 40 mg by mouth at bedtime.      No current facility-administered medications for this visit.    Allergies:   Ciprofloxacin, Sulfa antibiotics,  Iodine, Eggs or egg-derived products, and Penicillins    Social History:  The patient  reports that she has never smoked. She has never used smokeless tobacco. She reports current alcohol use. She reports that she does not use drugs.   Family History:  The patient's family history includes Asthma in her mother; Breast cancer in her paternal aunt; CAD in her mother; Heart attack in her father; Hypertension in her father, maternal grandfather, maternal grandmother, and mother.    ROS:  Please see the history of present illness.   Otherwise, review of systems are positive for none.   All other systems are reviewed and negative.    PHYSICAL EXAM: VS:  BP (!) 142/72   Pulse 89   Ht 5\' 4"  (1.626 m)   Wt 122 lb 3.2 oz (55.4 kg)   SpO2 98%   BMI 20.98 kg/m  , BMI Body mass index is 20.98 kg/m. GEN: Well nourished,  well developed, in no acute distress  HEENT: normal  Neck: no JVD, carotid bruits, or masses Cardiac: RRR; no murmurs, rubs, or gallops,no edema  Respiratory:  clear to auscultation bilaterally, normal work of breathing GI: soft, nontender, nondistended, + BS MS: no deformity or atrophy  Skin: warm and dry, no rash Neuro:  Strength and sensation are intact Psych: euthymic mood, full affect  EKG:  EKG is not ordered today.    Recent Labs: 02/02/2020: BUN 17; Creatinine, Ser 1.11; Hemoglobin 12.2; Platelets 289; Potassium 5.1; Sodium 142    Lipid Panel    Component Value Date/Time   CHOL 157 05/14/2018 0203   TRIG 120 05/14/2018 0203   HDL 52 05/14/2018 0203   CHOLHDL 3.0 05/14/2018 0203   VLDL 24 05/14/2018 0203   LDLCALC 81 05/14/2018 0203      Wt Readings from Last 3 Encounters:  10/16/20 122 lb 3.2 oz (55.4 kg)  10/01/20 124 lb (56.2 kg)  03/06/20 120 lb (54.4 kg)        No flowsheet data found.    ASSESSMENT AND PLAN:  1.  Peripheral arterial disease: Status post recent endovascular intervention on left SFA for severe claudication.  Mildly reduced  ABI on the left side with borderline elevated velocity in the mid SFA but still with no claudication.  Recommend continuing medical therapy and repeat Doppler studies in a year.  2.  Coronary artery disease involving native coronary arteries without angina: She is overall doing well.  She is on dual antiplatelet therapy.  3.  Essential hypertension: Blood pressure is mildly elevated but usually is well controlled.  4.  Hyperlipidemia: Currently on simvastatin 40 mg daily.  Recommend a target LDL of less than 70.  5.  Right-sided low back pain with radiation to right leg: Likely due to a spinal etiology.  Does not seem to be vascular.  Given severe persistent symptoms, suspect that she will require some form of imaging and referral to a spine specialist.  She has a follow-up appointment with her primary care physician tomorrow.   Disposition:   FU with me in 12 months  Signed,  Kathlyn Sacramento, MD  10/16/2020 10:04 AM    [place]

## 2020-10-18 ENCOUNTER — Other Ambulatory Visit: Payer: Self-pay

## 2020-10-18 ENCOUNTER — Ambulatory Visit
Admission: RE | Admit: 2020-10-18 | Discharge: 2020-10-18 | Disposition: A | Discharge status: Home / Self Care | Payer: Medicare HMO | Source: Ambulatory Visit | Attending: Internal Medicine | Admitting: Internal Medicine

## 2020-10-18 ENCOUNTER — Other Ambulatory Visit: Payer: Self-pay | Admitting: Internal Medicine

## 2020-10-18 DIAGNOSIS — M5431 Sciatica, right side: Secondary | ICD-10-CM

## 2020-10-18 DIAGNOSIS — I700 Atherosclerosis of aorta: Secondary | ICD-10-CM | POA: Diagnosis not present

## 2020-10-18 DIAGNOSIS — M47816 Spondylosis without myelopathy or radiculopathy, lumbar region: Secondary | ICD-10-CM | POA: Diagnosis not present

## 2020-10-18 DIAGNOSIS — M5432 Sciatica, left side: Secondary | ICD-10-CM | POA: Diagnosis not present

## 2020-10-25 DIAGNOSIS — M5116 Intervertebral disc disorders with radiculopathy, lumbar region: Secondary | ICD-10-CM | POA: Diagnosis not present

## 2020-10-27 ENCOUNTER — Other Ambulatory Visit: Payer: Self-pay | Admitting: Interventional Cardiology

## 2020-10-28 ENCOUNTER — Other Ambulatory Visit: Payer: Self-pay | Admitting: Interventional Cardiology

## 2020-10-31 ENCOUNTER — Other Ambulatory Visit: Payer: Self-pay | Admitting: Internal Medicine

## 2020-10-31 DIAGNOSIS — M5116 Intervertebral disc disorders with radiculopathy, lumbar region: Secondary | ICD-10-CM

## 2020-11-01 ENCOUNTER — Ambulatory Visit
Admission: RE | Admit: 2020-11-01 | Discharge: 2020-11-01 | Disposition: A | Discharge status: Home / Self Care | Payer: Medicare HMO | Source: Ambulatory Visit | Attending: Internal Medicine | Admitting: Internal Medicine

## 2020-11-01 ENCOUNTER — Other Ambulatory Visit: Payer: Self-pay

## 2020-11-01 DIAGNOSIS — M48061 Spinal stenosis, lumbar region without neurogenic claudication: Secondary | ICD-10-CM | POA: Diagnosis not present

## 2020-11-01 DIAGNOSIS — M5116 Intervertebral disc disorders with radiculopathy, lumbar region: Secondary | ICD-10-CM

## 2020-11-02 DIAGNOSIS — M5416 Radiculopathy, lumbar region: Secondary | ICD-10-CM | POA: Diagnosis not present

## 2020-11-08 DIAGNOSIS — M5416 Radiculopathy, lumbar region: Secondary | ICD-10-CM | POA: Diagnosis not present

## 2020-11-13 DIAGNOSIS — M5416 Radiculopathy, lumbar region: Secondary | ICD-10-CM | POA: Diagnosis not present

## 2020-11-27 DIAGNOSIS — M5416 Radiculopathy, lumbar region: Secondary | ICD-10-CM | POA: Diagnosis not present

## 2020-11-27 DIAGNOSIS — M47816 Spondylosis without myelopathy or radiculopathy, lumbar region: Secondary | ICD-10-CM | POA: Diagnosis not present

## 2020-11-28 DIAGNOSIS — R531 Weakness: Secondary | ICD-10-CM | POA: Diagnosis not present

## 2020-11-28 DIAGNOSIS — M549 Dorsalgia, unspecified: Secondary | ICD-10-CM | POA: Diagnosis not present

## 2020-11-28 DIAGNOSIS — G8929 Other chronic pain: Secondary | ICD-10-CM | POA: Diagnosis not present

## 2020-11-30 DIAGNOSIS — G8929 Other chronic pain: Secondary | ICD-10-CM | POA: Diagnosis not present

## 2020-11-30 DIAGNOSIS — R531 Weakness: Secondary | ICD-10-CM | POA: Diagnosis not present

## 2020-11-30 DIAGNOSIS — M549 Dorsalgia, unspecified: Secondary | ICD-10-CM | POA: Diagnosis not present

## 2020-12-03 DIAGNOSIS — R531 Weakness: Secondary | ICD-10-CM | POA: Diagnosis not present

## 2020-12-03 DIAGNOSIS — M549 Dorsalgia, unspecified: Secondary | ICD-10-CM | POA: Diagnosis not present

## 2020-12-03 DIAGNOSIS — G8929 Other chronic pain: Secondary | ICD-10-CM | POA: Diagnosis not present

## 2020-12-05 DIAGNOSIS — G8929 Other chronic pain: Secondary | ICD-10-CM | POA: Diagnosis not present

## 2020-12-05 DIAGNOSIS — R531 Weakness: Secondary | ICD-10-CM | POA: Diagnosis not present

## 2020-12-05 DIAGNOSIS — M549 Dorsalgia, unspecified: Secondary | ICD-10-CM | POA: Diagnosis not present

## 2020-12-07 DIAGNOSIS — E7849 Other hyperlipidemia: Secondary | ICD-10-CM | POA: Diagnosis not present

## 2020-12-07 DIAGNOSIS — I739 Peripheral vascular disease, unspecified: Secondary | ICD-10-CM | POA: Diagnosis not present

## 2020-12-07 DIAGNOSIS — Z23 Encounter for immunization: Secondary | ICD-10-CM | POA: Diagnosis not present

## 2020-12-07 DIAGNOSIS — Z0000 Encounter for general adult medical examination without abnormal findings: Secondary | ICD-10-CM | POA: Diagnosis not present

## 2020-12-07 DIAGNOSIS — E782 Mixed hyperlipidemia: Secondary | ICD-10-CM | POA: Diagnosis not present

## 2020-12-07 DIAGNOSIS — R7303 Prediabetes: Secondary | ICD-10-CM | POA: Diagnosis not present

## 2020-12-07 DIAGNOSIS — I25810 Atherosclerosis of coronary artery bypass graft(s) without angina pectoris: Secondary | ICD-10-CM | POA: Diagnosis not present

## 2020-12-07 DIAGNOSIS — I700 Atherosclerosis of aorta: Secondary | ICD-10-CM | POA: Diagnosis not present

## 2020-12-07 DIAGNOSIS — Z1389 Encounter for screening for other disorder: Secondary | ICD-10-CM | POA: Diagnosis not present

## 2020-12-07 DIAGNOSIS — M8580 Other specified disorders of bone density and structure, unspecified site: Secondary | ICD-10-CM | POA: Diagnosis not present

## 2020-12-07 DIAGNOSIS — E7800 Pure hypercholesterolemia, unspecified: Secondary | ICD-10-CM | POA: Diagnosis not present

## 2020-12-07 DIAGNOSIS — N183 Chronic kidney disease, stage 3 unspecified: Secondary | ICD-10-CM | POA: Diagnosis not present

## 2020-12-07 DIAGNOSIS — J4530 Mild persistent asthma, uncomplicated: Secondary | ICD-10-CM | POA: Diagnosis not present

## 2020-12-07 DIAGNOSIS — I10 Essential (primary) hypertension: Secondary | ICD-10-CM | POA: Diagnosis not present

## 2020-12-11 DIAGNOSIS — G8929 Other chronic pain: Secondary | ICD-10-CM | POA: Diagnosis not present

## 2020-12-11 DIAGNOSIS — M549 Dorsalgia, unspecified: Secondary | ICD-10-CM | POA: Diagnosis not present

## 2020-12-14 DIAGNOSIS — G8929 Other chronic pain: Secondary | ICD-10-CM | POA: Diagnosis not present

## 2020-12-14 DIAGNOSIS — M549 Dorsalgia, unspecified: Secondary | ICD-10-CM | POA: Diagnosis not present

## 2021-01-03 DIAGNOSIS — M47816 Spondylosis without myelopathy or radiculopathy, lumbar region: Secondary | ICD-10-CM | POA: Diagnosis not present

## 2021-01-11 DIAGNOSIS — M47896 Other spondylosis, lumbar region: Secondary | ICD-10-CM | POA: Diagnosis not present

## 2021-01-11 DIAGNOSIS — M47816 Spondylosis without myelopathy or radiculopathy, lumbar region: Secondary | ICD-10-CM | POA: Diagnosis not present

## 2021-01-11 DIAGNOSIS — M5416 Radiculopathy, lumbar region: Secondary | ICD-10-CM | POA: Diagnosis not present

## 2021-02-15 DIAGNOSIS — H2513 Age-related nuclear cataract, bilateral: Secondary | ICD-10-CM | POA: Diagnosis not present

## 2021-02-15 DIAGNOSIS — H353221 Exudative age-related macular degeneration, left eye, with active choroidal neovascularization: Secondary | ICD-10-CM | POA: Diagnosis not present

## 2021-02-15 DIAGNOSIS — H353111 Nonexudative age-related macular degeneration, right eye, early dry stage: Secondary | ICD-10-CM | POA: Diagnosis not present

## 2021-02-27 ENCOUNTER — Encounter (INDEPENDENT_AMBULATORY_CARE_PROVIDER_SITE_OTHER): Payer: Self-pay

## 2021-03-05 ENCOUNTER — Other Ambulatory Visit: Payer: Self-pay

## 2021-03-05 ENCOUNTER — Ambulatory Visit (INDEPENDENT_AMBULATORY_CARE_PROVIDER_SITE_OTHER): Payer: Medicare HMO | Admitting: Ophthalmology

## 2021-03-05 ENCOUNTER — Encounter (INDEPENDENT_AMBULATORY_CARE_PROVIDER_SITE_OTHER): Payer: Self-pay | Admitting: Ophthalmology

## 2021-03-05 ENCOUNTER — Encounter (INDEPENDENT_AMBULATORY_CARE_PROVIDER_SITE_OTHER): Payer: Self-pay

## 2021-03-05 DIAGNOSIS — H353221 Exudative age-related macular degeneration, left eye, with active choroidal neovascularization: Secondary | ICD-10-CM | POA: Diagnosis not present

## 2021-03-05 DIAGNOSIS — H353132 Nonexudative age-related macular degeneration, bilateral, intermediate dry stage: Secondary | ICD-10-CM

## 2021-03-05 DIAGNOSIS — H43821 Vitreomacular adhesion, right eye: Secondary | ICD-10-CM | POA: Diagnosis not present

## 2021-03-05 MED ORDER — BEVACIZUMAB 2.5 MG/0.1ML IZ SOSY
2.5000 mg | PREFILLED_SYRINGE | INTRAVITREAL | Status: AC | PRN
  Administered 2021-03-05: 2.5 mg via INTRAVITREAL

## 2021-03-05 NOTE — Assessment & Plan Note (Signed)

## 2021-03-05 NOTE — Assessment & Plan Note (Signed)

## 2021-03-05 NOTE — Progress Notes (Signed)
03/05/2021     CHIEF COMPLAINT Patient presents for  Chief Complaint  Patient presents with   Retina Evaluation      HISTORY OF PRESENT ILLNESS: Melissa Elliott is a 80 y.o. female who presents to the clinic today for:   HPI     Retina Evaluation           Laterality: left eye   Onset: 3 weeks ago   Duration: 3   Associated Symptoms: Distortion (wavy vision) and Photophobia (night driving).  Negative for Flashes and Floaters   Context: distance vision and near vision   Treatments tried: no treatments         Comments   NP Wet AMD OS, Dr. Shirleen Schirmer Patient has an allergy to ciprofloxacin.  "If I close my left eye when I am doing cross-stitch it is crooked/ wavy. It is fine unless I close my right eye. Looking at the doorway the straight door is wavy, only when I close my right eye. Looking with both eyes, it is fine. I only noticed it right before my appt with Dr Katy Fitch." Her appt with Dr Katy Fitch was 02/15/21, 3 weeks ago. Denies FOL, floaters, pain. Pt states "Dr. Katy Fitch said I have cataracts but have to wait to get this figure out." BS: does not check      Last edited by Laurin Coder, COA on 03/05/2021  1:42 PM.      Referring physician: Wenda Low, MD [address],  [place] [address]  HISTORICAL INFORMATION:   Selected notes from the [facility]: No current outpatient medications on file. (Ophthalmic Drugs)   No current facility-administered medications for this visit. (Ophthalmic Drugs)   Current Outpatient Medications (Other)  Medication Sig   ADVAIR DISKUS 250-50 MCG/DOSE AEPB Inhale 1 puff into the lungs 2 (two) times daily. Only uses in winter and spring months   albuterol (PROVENTIL HFA;VENTOLIN HFA) 108 (90 Base) MCG/ACT inhaler Inhale 2 puffs into the lungs every 6 (six) hours as needed for wheezing.   aspirin 81 MG EC tablet Take 81 mg by mouth daily. Swallow whole.   Calcium  Carbonate-Vitamin D (CALCIUM-D) 600-400 MG-UNIT TABS Take 1 tablet by mouth daily in the afternoon.    cholecalciferol (VITAMIN D) 1000 units tablet Take 1,000 Units by mouth daily with lunch.   clopidogrel (PLAVIX) 75 MG tablet TAKE 1 TABLET BY MOUTH EVERY DAY WITH BREAKFAST   fexofenadine (ALLEGRA) 180 MG tablet Take 180 mg by mouth daily.   irbesartan (AVAPRO) 150 MG tablet TAKE 1 TABLET(150 MG) BY MOUTH DAILY   metFORMIN (GLUCOPHAGE) 500 MG tablet Take 500 mg by mouth daily. with food   metoprolol succinate (TOPROL-XL) 50 MG 24 hr tablet Take 50 mg by mouth daily.    montelukast (SINGULAIR) 10 MG tablet Take 10 mg by mouth at bedtime.   Multiple Vitamins-Minerals (PRESERVISION AREDS 2) CAPS Take 1 capsule by mouth 2 (two) times daily.   nitroGLYCERIN (NITROSTAT) 0.4 MG SL tablet Place 0.4 mg under the tongue every 5 (five) minutes as needed for chest pain (UP TO 3 DOSES BEFORE CALLING EMS).   simvastatin (ZOCOR) 40 MG tablet Take 40 mg by mouth at bedtime.    No current facility-administered medications for this visit. (Other)      REVIEW OF SYSTEMS:    ALLERGIES Allergies  Allergen Reactions   Ciprofloxacin Nausea And Vomiting    Severe "  dry heaves"   Sulfa Antibiotics Swelling    Swelling in face, neck, arms, hands.    Iodine Hives   Eggs Or Egg-Derived Products Nausea And Vomiting    "sometimes I can eat eggs; sometimes I can't"   Penicillins     As a child Has patient had a PCN reaction causing immediate rash, facial/tongue/throat swelling, SOB or lightheadedness with hypotension: Unknown Has patient had a PCN reaction causing severe rash involving mucus membranes or skin necrosis: Unknown Has patient had a PCN reaction that required hospitalization: Unknown Has patient had a PCN reaction occurring within the last 10 years: Unknown If all of the above answers are "NO", then may proceed with Cephalosporin use.     PAST MEDICAL HISTORY Past Medical History:  Diagnosis  Date   Asthma    Complication of anesthesia    "takes a long time to wakeup; sometimes days" (05/11/2018)   Coronary artery disease    H/O hiatal hernia    Heart murmur    High cholesterol    "on RX because of the bypass" (02/01/2013)   History of duodenal ulcer    Hot flashes    "on a regular basis" (05/11/2018)   Hypertension    Hypoglycemia    "that's something I have to watch constantly" (02/01/2013)   Migraine    "probably stopped w/menses" (05/11/2018)   Pneumonia ? 1970's   "once" (05/11/2018)   Sciatic nerve pain 12/2012   "RLE; just had it once" (05/11/2018)   Past Surgical History:  Procedure Laterality Date   ABDOMINAL AORTOGRAM W/LOWER EXTREMITY N/A 02/08/2020   Procedure: ABDOMINAL AORTOGRAM W/LOWER EXTREMITY;  Surgeon: Wellington Hampshire, MD;  Location: [facility];  Service: Cardiovascular;  Laterality: N/A;   APPENDECTOMY  1970   BREAST BIOPSY Left    benign   COLONOSCOPY WITH PROPOFOL N/A 06/19/2015   Procedure: COLONOSCOPY WITH PROPOFOL;  Surgeon: Garlan Fair, MD;  Location: [facility];  Service: Endoscopy;  Laterality: N/A;   CORONARY ANGIOPLASTY WITH STENT PLACEMENT  05/11/2018   CORONARY ANGIOPLASTY WITH STENT PLACEMENT  1997   CORONARY ARTERY BYPASS GRAFT  ~ 1997   CABG X2   CORONARY STENT INTERVENTION N/A 05/11/2018   Procedure: CORONARY STENT INTERVENTION;  Surgeon: Jettie Booze, MD;  Location: [facility];  Service: Cardiovascular;  Laterality: N/A;   ESOPHAGOGASTRODUODENOSCOPY N/A 02/02/2013   Procedure: ESOPHAGOGASTRODUODENOSCOPY (EGD);  Surgeon: Wonda Horner, MD;  Location: [facility];  Service: Endoscopy;  Laterality: N/A;   LEFT HEART CATH AND CORS/GRAFTS ANGIOGRAPHY N/A 05/11/2018   Procedure: LEFT HEART CATH AND CORS/GRAFTS ANGIOGRAPHY;  Surgeon: Jettie Booze, MD;  Location: [facility];  Service: Cardiovascular;  Laterality: N/A;   LEFT HEART CATH AND CORS/GRAFTS ANGIOGRAPHY N/A 05/14/2018   Procedure:  LEFT HEART CATH AND CORS/GRAFTS ANGIOGRAPHY;  Surgeon: Jettie Booze, MD;  Location: [facility];  Service: Cardiovascular;  Laterality: N/A;   PERIPHERAL VASCULAR ATHERECTOMY Left 02/08/2020   Procedure: PERIPHERAL VASCULAR ATHERECTOMY;  Surgeon: Wellington Hampshire, MD;  Location: [facility];  Service: Cardiovascular;  Laterality: Left;  sfa (distal)   PERIPHERAL VASCULAR BALLOON ANGIOPLASTY Left 02/08/2020   Procedure: PERIPHERAL VASCULAR BALLOON ANGIOPLASTY;  Surgeon: Wellington Hampshire, MD;  Location: [facility];  Service: Cardiovascular;  Laterality: Left;  sfa (distal)   SKIN LESION EXCISION Left 1960's   "upper arm; sore that never healed" (02/01/2013)   TONSILLECTOMY  1940's    FAMILY  HISTORY Family History  Problem Relation Age of Onset   CAD Mother    Hypertension Mother    Asthma Mother    Heart attack Father    Hypertension Father    Hypertension Maternal Grandmother    Hypertension Maternal Grandfather    Breast cancer Paternal Aunt    Stroke Neg Hx     SOCIAL HISTORY Social History   Tobacco Use   Smoking status: Never   Smokeless tobacco: Never   Tobacco comments:    Exposure through parents.  Vaping Use   Vaping Use: Never used  Substance Use Topics   Alcohol use: Yes    Alcohol/week: 0.0 standard drinks    Comment: 05/11/2018  "glass of wine maybe once/month"   Drug use: Never         OPHTHALMIC EXAM:  Base Eye Exam     Visual Acuity (ETDRS)       Right Left   Dist cc 20/40 +2 20/30 -2   Dist ph cc 20/30 +2 NI    Correction: Glasses         Tonometry (Tonopen, 1:40 PM)       Right Left   Pressure 18 16         Pupils       Pupils Dark Light Shape React APD   Right PERRL 4 3 Round Brisk None   Left PERRL 4 3 Round Brisk None         Visual Fields (Counting fingers)       Left Right    Full Full         Extraocular Movement       Right Left    Full Full         Neuro/Psych      Oriented x3: Yes   Mood/Affect: Normal         Dilation     Both eyes: 1.0% Mydriacyl, 2.5% Phenylephrine @ 1:40 PM           Slit Lamp and Fundus Exam     External Exam       Right Left   External Normal Normal         Slit Lamp Exam       Right Left   Lids/Lashes Normal Normal   Conjunctiva/Sclera White and quiet White and quiet   Cornea Clear Clear   Anterior Chamber Deep and quiet Deep and quiet   Iris Round and reactive Round and reactive   Lens Clear Clear   Anterior Vitreous Normal Normal         Fundus Exam       Right Left   Posterior Vitreous Normal Normal   Disc Normal Normal   C/D Ratio 0.55 0.55   Macula Normal Normal   Vessels Normal Normal   Periphery Normal Normal            IMAGING AND PROCEDURES  Imaging and Procedures for 03/05/21  OCT, Retina - OU - Both Eyes       Right Eye Quality was good. Scan locations included subfoveal. Progression has no prior data. Findings include normal foveal contour, retinal drusen .   Left Eye Quality was good. Scan locations included subfoveal. Central Foveal Thickness: 473. Progression has no prior data. Findings include abnormal foveal contour, intraretinal fluid, subretinal fluid, pigment epithelial detachment, retinal drusen .   Notes Newly discovered vascularized pigment epithelial detachment, wet AMD, with subretinal fluid and significant foveal elevation, OS  OD no sign of CNVM     Color Fundus Photography Optos - OU - Both Eyes       Right Eye Progression has no prior data. Disc findings include normal observations. Macula : normal observations, drusen. Vessels : normal observations. Periphery : normal observations.   Left Eye Progression has no prior data. Macula : retinal pigment epithelium abnormalities, drusen, edema. Vessels : normal observations. Periphery : normal observations.   Notes Subfoveal pigment epithelial detachment macular thickening left eye, no obvious  clinical hemorrhage OS     Intravitreal Injection, Pharmacologic Agent - OS - Left Eye       Time Out 03/05/2021. 2:25 PM. Confirmed correct patient, procedure, site, and patient consented.   Anesthesia Subconjunctival anesthesia was used. Anesthetic medications included Akten 3.5%.   Procedure Preparation included 10% betadine to eyelids, 5% betadine to ocular surface, Tobramycin 0.3%. A 30 gauge needle was used.   Injection: 2.5 mg bevacizumab 2.5 MG/0.1ML   Route: Intravitreal, Site: Left Eye   NDC: [phone], Lot: [id number]   Post-op Post injection exam found visual acuity of at least counting fingers. The patient tolerated the procedure well. There were no complications. The patient received written and verbal post procedure care education. Post injection medications were not given.              ASSESSMENT/PLAN:  Exudative age-related macular degeneration of left eye with active choroidal neovascularization (HCC) The nature of wet macular degeneration was discussed with the patient.  Forms of therapy reviewed include the use of Anti-VEGF medications injected painlessly into the eye, as well as other possible treatment modalities, including thermal laser therapy. Fellow eye involvement and risks were discussed with the patient. Upon the finding of wet age related macular degeneration, treatment will be offered. The treatment regimen is on a treat as needed basis with the intent to treat if necessary and extend interval of exams when possible. On average 1 out of 6 patients do not need lifetime therapy. However, the risk of recurrent disease is high for a lifetime.  Initially monthly, then periodic, examinations and evaluations will determine whether the next treatment is required on the day of the examination.  Intermediate stage nonexudative age-related macular degeneration of both eyes The nature of age--related macular degeneration was discussed with the patient as well as  the distinction between dry and wet types. Checking an Amsler Grid daily with advice to return immediately should a distortion develop, was given to the patient. The patient 's smoking status now and in the past was determined and advice based on the AREDS study was provided regarding the consumption of antioxidant supplements. AREDS 2 vitamin formulation was recommended. Consumption of dark leafy vegetables and fresh fruits of various colors was recommended. Treatment modalities for wet macular degeneration particularly the use of intravitreal injections of anti-blood vessel growth factors was discussed with the patient. Avastin, Lucentis, and Eylea are the available options. On occasion, therapy includes the use of photodynamic therapy and thermal laser. Stressed to the patient do not rub eyes.  Patient was advised to check Amsler Grid daily and return immediately if changes are noted. Instructions on using the grid were given to the patient. All patient questions were answered.      ICD-10-CM   1. Exudative age-related macular degeneration of left eye with active choroidal neovascularization (HCC)  H35.3221 OCT, Retina - OU - Both Eyes    Color Fundus Photography Optos - OU - Both Eyes    Intravitreal Injection,  Pharmacologic Agent - OS - Left Eye    bevacizumab (AVASTIN) SOSY 2.5 mg    2. Intermediate stage nonexudative age-related macular degeneration of both eyes  H35.3132 OCT, Retina - OU - Both Eyes    Color Fundus Photography Optos - OU - Both Eyes    3. Vitreomacular adhesion of right eye  H43.821 OCT, Retina - OU - Both Eyes    Color Fundus Photography Optos - OU - Both Eyes      1.  OD no signs of CNVM  2.  OS with good acuity preserved yet with subfoveal pigment epithelial detachment and adjacent serous retinal detachments from wet AMD.  We will commence with antivegF therapy, intravitreal Avastin OS now  3.  Risk benefits reviewed with the patient  Ophthalmic Meds Ordered this  visit:  Meds ordered this encounter  Medications   bevacizumab (AVASTIN) SOSY 2.5 mg       Return in about 1 month (around 04/05/2021) for dilate, OS, AVASTIN OCT.  There are no Patient Instructions on file for this visit.   Explained the diagnoses, plan, and follow up with the patient and they expressed understanding.  Patient expressed understanding of the importance of proper follow up care.   Clent Demark Joyel Chenette M.D. Diseases & Surgery of the Retina and Vitreous [facility] 03/05/21     Abbreviations: M myopia (nearsighted); A astigmatism; H hyperopia (farsighted); P presbyopia; Mrx spectacle prescription;  CTL contact lenses; OD right eye; OS left eye; OU both eyes  XT exotropia; ET esotropia; PEK punctate epithelial keratitis; PEE punctate epithelial erosions; DES dry eye syndrome; MGD meibomian gland dysfunction; ATs artificial tears; PFAT's preservative free artificial tears; [place] nuclear sclerotic cataract; PSC posterior subcapsular cataract; ERM epi-retinal membrane; PVD posterior vitreous detachment; RD retinal detachment; DM diabetes mellitus; DR diabetic retinopathy; NPDR non-proliferative diabetic retinopathy; PDR proliferative diabetic retinopathy; CSME clinically significant macular edema; DME diabetic macular edema; dbh dot blot hemorrhages; CWS cotton wool spot; POAG primary open angle glaucoma; C/D cup-to-disc ratio; HVF humphrey visual field; GVF goldmann visual field; OCT optical coherence tomography; IOP intraocular pressure; BRVO Branch retinal vein occlusion; CRVO central retinal vein occlusion; CRAO central retinal artery occlusion; BRAO branch retinal artery occlusion; RT retinal tear; SB scleral buckle; PPV pars plana vitrectomy; VH Vitreous hemorrhage; PRP panretinal laser photocoagulation; IVK intravitreal kenalog; VMT vitreomacular traction; MH Macular hole;  NVD neovascularization of the disc; NVE neovascularization elsewhere; AREDS age related eye  disease study; ARMD age related macular degeneration; POAG primary open angle glaucoma; EBMD epithelial/anterior basement membrane dystrophy; ACIOL anterior chamber intraocular lens; IOL intraocular lens; PCIOL posterior chamber intraocular lens; Phaco/IOL phacoemulsification with intraocular lens placement; [place] photorefractive keratectomy; LASIK laser assisted in situ keratomileusis; HTN hypertension; DM diabetes mellitus; COPD chronic obstructive pulmonary disease

## 2021-04-09 ENCOUNTER — Encounter (INDEPENDENT_AMBULATORY_CARE_PROVIDER_SITE_OTHER): Payer: Medicare HMO | Admitting: Ophthalmology

## 2021-04-11 ENCOUNTER — Ambulatory Visit (INDEPENDENT_AMBULATORY_CARE_PROVIDER_SITE_OTHER): Payer: Medicare HMO | Admitting: Ophthalmology

## 2021-04-11 ENCOUNTER — Other Ambulatory Visit: Payer: Self-pay

## 2021-04-11 ENCOUNTER — Encounter (INDEPENDENT_AMBULATORY_CARE_PROVIDER_SITE_OTHER): Payer: Self-pay | Admitting: Ophthalmology

## 2021-04-11 DIAGNOSIS — H353221 Exudative age-related macular degeneration, left eye, with active choroidal neovascularization: Secondary | ICD-10-CM

## 2021-04-11 MED ORDER — BEVACIZUMAB 2.5 MG/0.1ML IZ SOSY
2.5000 mg | PREFILLED_SYRINGE | INTRAVITREAL | Status: AC | PRN
  Administered 2021-04-11: 2.5 mg via INTRAVITREAL

## 2021-04-11 NOTE — Progress Notes (Signed)
04/11/2021     CHIEF COMPLAINT Patient presents for  Chief Complaint  Patient presents with   Retina Follow Up      HISTORY OF PRESENT ILLNESS: Melissa Elliott is a 80 y.o. female who presents to the clinic today for:   HPI     Retina Follow Up   Patient presents with  Wet AMD.  In left eye.  This started 5 weeks ago.  Duration of 5 weeks.        Comments   5 week f/u OS with OCT and possible Avastin injection OS  Pt denies any visual changes since previous visit. Pt denies any new flashes or floaters. Pt denies any eye pain.  EyeMeds: Preservision AREDS 2      Last edited by Reather Littler, COA on 04/11/2021  2:27 PM.      Referring physician: Wenda Low, MD [address],  [place] [address]  HISTORICAL INFORMATION:   Selected notes from the [facility]: No current outpatient medications on file. (Ophthalmic Drugs)   No current facility-administered medications for this visit. (Ophthalmic Drugs)   Current Outpatient Medications (Other)  Medication Sig   ADVAIR DISKUS 250-50 MCG/DOSE AEPB Inhale 1 puff into the lungs 2 (two) times daily. Only uses in winter and spring months   albuterol (PROVENTIL HFA;VENTOLIN HFA) 108 (90 Base) MCG/ACT inhaler Inhale 2 puffs into the lungs every 6 (six) hours as needed for wheezing.   aspirin 81 MG EC tablet Take 81 mg by mouth daily. Swallow whole.   Calcium Carbonate-Vitamin D (CALCIUM-D) 600-400 MG-UNIT TABS Take 1 tablet by mouth daily in the afternoon.    cholecalciferol (VITAMIN D) 1000 units tablet Take 1,000 Units by mouth daily with lunch.   clopidogrel (PLAVIX) 75 MG tablet TAKE 1 TABLET BY MOUTH EVERY DAY WITH BREAKFAST   fexofenadine (ALLEGRA) 180 MG tablet Take 180 mg by mouth daily.   irbesartan (AVAPRO) 150 MG tablet TAKE 1 TABLET(150 MG) BY MOUTH DAILY   metFORMIN (GLUCOPHAGE) 500 MG tablet Take 500 mg by mouth daily. with food   metoprolol  succinate (TOPROL-XL) 50 MG 24 hr tablet Take 50 mg by mouth daily.    montelukast (SINGULAIR) 10 MG tablet Take 10 mg by mouth at bedtime.   Multiple Vitamins-Minerals (PRESERVISION AREDS 2) CAPS Take 1 capsule by mouth 2 (two) times daily.   nitroGLYCERIN (NITROSTAT) 0.4 MG SL tablet Place 0.4 mg under the tongue every 5 (five) minutes as needed for chest pain (UP TO 3 DOSES BEFORE CALLING EMS).   simvastatin (ZOCOR) 40 MG tablet Take 40 mg by mouth at bedtime.    No current facility-administered medications for this visit. (Other)      REVIEW OF SYSTEMS:    ALLERGIES Allergies  Allergen Reactions   Ciprofloxacin Nausea And Vomiting    Severe "dry heaves"   Sulfa Antibiotics Swelling    Swelling in face, neck, arms, hands.    Iodine Hives   Eggs Or Egg-Derived Products Nausea And Vomiting    "sometimes I can eat eggs; sometimes I can't"   Penicillins     As a child Has patient had a PCN reaction causing immediate rash, facial/tongue/throat swelling, SOB or lightheadedness with hypotension: Unknown Has patient had a PCN reaction causing severe rash involving mucus membranes or skin necrosis: Unknown Has patient had a PCN reaction that required hospitalization: Unknown Has patient had a PCN reaction  occurring within the last 10 years: Unknown If all of the above answers are "NO", then may proceed with Cephalosporin use.     PAST MEDICAL HISTORY Past Medical History:  Diagnosis Date   Asthma    Complication of anesthesia    "takes a long time to wakeup; sometimes days" (05/11/2018)   Coronary artery disease    H/O hiatal hernia    Heart murmur    High cholesterol    "on RX because of the bypass" (02/01/2013)   History of duodenal ulcer    Hot flashes    "on a regular basis" (05/11/2018)   Hypertension    Hypoglycemia    "that's something I have to watch constantly" (02/01/2013)   Migraine    "probably stopped w/menses" (05/11/2018)   Pneumonia ? 1970's   "once"  (05/11/2018)   Sciatic nerve pain 12/2012   "RLE; just had it once" (05/11/2018)   Past Surgical History:  Procedure Laterality Date   ABDOMINAL AORTOGRAM W/LOWER EXTREMITY N/A 02/08/2020   Procedure: ABDOMINAL AORTOGRAM W/LOWER EXTREMITY;  Surgeon: Wellington Hampshire, MD;  Location: [facility];  Service: Cardiovascular;  Laterality: N/A;   APPENDECTOMY  1970   BREAST BIOPSY Left    benign   COLONOSCOPY WITH PROPOFOL N/A 06/19/2015   Procedure: COLONOSCOPY WITH PROPOFOL;  Surgeon: Garlan Fair, MD;  Location: [facility];  Service: Endoscopy;  Laterality: N/A;   CORONARY ANGIOPLASTY WITH STENT PLACEMENT  05/11/2018   CORONARY ANGIOPLASTY WITH STENT PLACEMENT  1997   CORONARY ARTERY BYPASS GRAFT  ~ 1997   CABG X2   CORONARY STENT INTERVENTION N/A 05/11/2018   Procedure: CORONARY STENT INTERVENTION;  Surgeon: Jettie Booze, MD;  Location: [facility];  Service: Cardiovascular;  Laterality: N/A;   ESOPHAGOGASTRODUODENOSCOPY N/A 02/02/2013   Procedure: ESOPHAGOGASTRODUODENOSCOPY (EGD);  Surgeon: Wonda Horner, MD;  Location: [facility];  Service: Endoscopy;  Laterality: N/A;   LEFT HEART CATH AND CORS/GRAFTS ANGIOGRAPHY N/A 05/11/2018   Procedure: LEFT HEART CATH AND CORS/GRAFTS ANGIOGRAPHY;  Surgeon: Jettie Booze, MD;  Location: [facility];  Service: Cardiovascular;  Laterality: N/A;   LEFT HEART CATH AND CORS/GRAFTS ANGIOGRAPHY N/A 05/14/2018   Procedure: LEFT HEART CATH AND CORS/GRAFTS ANGIOGRAPHY;  Surgeon: Jettie Booze, MD;  Location: [facility];  Service: Cardiovascular;  Laterality: N/A;   PERIPHERAL VASCULAR ATHERECTOMY Left 02/08/2020   Procedure: PERIPHERAL VASCULAR ATHERECTOMY;  Surgeon: Wellington Hampshire, MD;  Location: [facility];  Service: Cardiovascular;  Laterality: Left;  sfa (distal)   PERIPHERAL VASCULAR BALLOON ANGIOPLASTY Left 02/08/2020   Procedure: PERIPHERAL VASCULAR BALLOON ANGIOPLASTY;  Surgeon: Wellington Hampshire, MD;  Location: [facility];  Service: Cardiovascular;  Laterality: Left;  sfa (distal)   SKIN LESION EXCISION Left 1960's   "upper arm; sore that never healed" (02/01/2013)   TONSILLECTOMY  1940's    FAMILY HISTORY Family History  Problem Relation Age of Onset   CAD Mother    Hypertension Mother    Asthma Mother    Heart attack Father    Hypertension Father    Hypertension Maternal Grandmother    Hypertension Maternal Grandfather    Breast cancer Paternal Aunt    Stroke Neg Hx     SOCIAL HISTORY Social History   Tobacco Use   Smoking status: Never   Smokeless tobacco: Never   Tobacco comments:    Exposure through parents.  Vaping Use   Vaping Use: Never used  Substance Use Topics   Alcohol use: Yes    Alcohol/week: 0.0 standard drinks    Comment: 05/11/2018  "glass of wine maybe once/month"   Drug use: Never         OPHTHALMIC EXAM:  Base Eye Exam     Visual Acuity (ETDRS)       Right Left   Dist cc 20/30 -2 20/25 -2   Dist ph cc NI     Correction: Glasses         Tonometry (Tonopen, 2:36 PM)       Right Left   Pressure 11 10         Pupils       Pupils Dark Light Shape React APD   Right PERRL 4 3 Round Brisk None   Left PERRL 4 3 Round Brisk None         Visual Fields (Counting fingers)       Left Right    Full Full         Extraocular Movement       Right Left    Full, Ortho Full, Ortho         Neuro/Psych     Oriented x3: Yes   Mood/Affect: Normal         Dilation     Left eye: 1.0% Mydriacyl, 2.5% Phenylephrine @ 2:35 PM           Slit Lamp and Fundus Exam     External Exam       Right Left   External Normal Normal         Slit Lamp Exam       Right Left   Lids/Lashes Normal Normal   Conjunctiva/Sclera White and quiet White and quiet   Cornea Clear Clear   Anterior Chamber Deep and quiet Deep and quiet   Iris Round and reactive Round and reactive   Lens Clear Clear    Anterior Vitreous Normal Normal         Fundus Exam       Right Left   Posterior Vitreous  Normal   Disc  Normal   C/D Ratio  0.55   Macula  Macular thickening, no exudates, no hemorrhage, Choroidal neovascular membrane with PED, Retinal pigment epithelial detachment   Vessels  Normal   Periphery  Normal            IMAGING AND PROCEDURES  Imaging and Procedures for 04/11/21  OCT, Retina - OU - Both Eyes       Right Eye Quality was good. Scan locations included subfoveal. Central Foveal Thickness: 245. Progression has no prior data. Findings include normal foveal contour, retinal drusen .   Left Eye Quality was good. Scan locations included subfoveal. Central Foveal Thickness: 437. Progression has no prior data. Findings include abnormal foveal contour, intraretinal fluid, subretinal fluid, pigment epithelial detachment, retinal drusen .   Notes Recently discovered vascularized pigment epithelial detachment, wet AMD, with subretinal fluid and significant foveal elevation, OS, improved post Avastin No. 1,  OD no sign of CNVM     Intravitreal Injection, Pharmacologic Agent - OS - Left Eye       Time Out 04/11/2021. 3:11 PM. Confirmed correct patient, procedure, site, and patient consented.   Anesthesia Subconjunctival anesthesia was used. Anesthetic medications included Akten 3.5%.   Procedure Preparation included 10% betadine to eyelids, 5% betadine to ocular surface, Tobramycin 0.3%. A 30 gauge needle was used.   Injection: 2.5 mg bevacizumab  2.5 MG/0.1ML   Route: Intravitreal, Site: Left Eye   NDC: [phone], Lot: [id number]   Post-op Post injection exam found visual acuity of at least counting fingers. The patient tolerated the procedure well. There were no complications. The patient received written and verbal post procedure care education. Post injection medications were not given.              ASSESSMENT/PLAN:  Exudative age-related macular  degeneration of left eye with active choroidal neovascularization (HCC) OS with active subfoveal CNVM with PED, improved postinjection #1 Avastin.  Preserved acuity.  We will repeat injection today and follow-up next in 5 weeks     ICD-10-CM   1. Exudative age-related macular degeneration of left eye with active choroidal neovascularization (HCC)  H35.3221 OCT, Retina - OU - Both Eyes    Intravitreal Injection, Pharmacologic Agent - OS - Left Eye    bevacizumab (AVASTIN) SOSY 2.5 mg      1.  OS improved postinjection #1 Avastin, will repeat injection today to maintain acuity and hopefully resolve this condition to a much higher degree  2.  Patient instructed to contact the office promptly for new onset visual acuity declines or distortions  3.  Ophthalmic Meds Ordered this visit:  Meds ordered this encounter  Medications   bevacizumab (AVASTIN) SOSY 2.5 mg       Return in about 5 weeks (around 05/16/2021) for dilate, OS, AVASTIN OCT.  There are no Patient Instructions on file for this visit.   Explained the diagnoses, plan, and follow up with the patient and they expressed understanding.  Patient expressed understanding of the importance of proper follow up care.   Clent Demark Scharlene Catalina M.D. Diseases & Surgery of the Retina and Vitreous [facility] 04/11/21     Abbreviations: M myopia (nearsighted); A astigmatism; H hyperopia (farsighted); P presbyopia; Mrx spectacle prescription;  CTL contact lenses; OD right eye; OS left eye; OU both eyes  XT exotropia; ET esotropia; PEK punctate epithelial keratitis; PEE punctate epithelial erosions; DES dry eye syndrome; MGD meibomian gland dysfunction; ATs artificial tears; PFAT's preservative free artificial tears; [place] nuclear sclerotic cataract; PSC posterior subcapsular cataract; ERM epi-retinal membrane; PVD posterior vitreous detachment; RD retinal detachment; DM diabetes mellitus; DR diabetic retinopathy; NPDR  non-proliferative diabetic retinopathy; PDR proliferative diabetic retinopathy; CSME clinically significant macular edema; DME diabetic macular edema; dbh dot blot hemorrhages; CWS cotton wool spot; POAG primary open angle glaucoma; C/D cup-to-disc ratio; HVF humphrey visual field; GVF goldmann visual field; OCT optical coherence tomography; IOP intraocular pressure; BRVO Branch retinal vein occlusion; CRVO central retinal vein occlusion; CRAO central retinal artery occlusion; BRAO branch retinal artery occlusion; RT retinal tear; SB scleral buckle; PPV pars plana vitrectomy; VH Vitreous hemorrhage; PRP panretinal laser photocoagulation; IVK intravitreal kenalog; VMT vitreomacular traction; MH Macular hole;  NVD neovascularization of the disc; NVE neovascularization elsewhere; AREDS age related eye disease study; ARMD age related macular degeneration; POAG primary open angle glaucoma; EBMD epithelial/anterior basement membrane dystrophy; ACIOL anterior chamber intraocular lens; IOL intraocular lens; PCIOL posterior chamber intraocular lens; Phaco/IOL phacoemulsification with intraocular lens placement; [place] photorefractive keratectomy; LASIK laser assisted in situ keratomileusis; HTN hypertension; DM diabetes mellitus; COPD chronic obstructive pulmonary disease

## 2021-04-11 NOTE — Assessment & Plan Note (Signed)
OS with active subfoveal CNVM with PED, improved postinjection #1 Avastin.  Preserved acuity.  We will repeat injection today and follow-up next in 5 weeks

## 2021-05-16 ENCOUNTER — Encounter (INDEPENDENT_AMBULATORY_CARE_PROVIDER_SITE_OTHER): Payer: Medicare HMO | Admitting: Ophthalmology

## 2021-05-23 ENCOUNTER — Other Ambulatory Visit: Payer: Self-pay

## 2021-05-23 ENCOUNTER — Encounter (INDEPENDENT_AMBULATORY_CARE_PROVIDER_SITE_OTHER): Payer: Self-pay | Admitting: Ophthalmology

## 2021-05-23 ENCOUNTER — Ambulatory Visit (INDEPENDENT_AMBULATORY_CARE_PROVIDER_SITE_OTHER): Payer: Medicare HMO | Admitting: Ophthalmology

## 2021-05-23 DIAGNOSIS — H353132 Nonexudative age-related macular degeneration, bilateral, intermediate dry stage: Secondary | ICD-10-CM | POA: Diagnosis not present

## 2021-05-23 DIAGNOSIS — H353221 Exudative age-related macular degeneration, left eye, with active choroidal neovascularization: Secondary | ICD-10-CM | POA: Diagnosis not present

## 2021-05-23 MED ORDER — BEVACIZUMAB 2.5 MG/0.1ML IZ SOSY
2.5000 mg | PREFILLED_SYRINGE | INTRAVITREAL | Status: AC | PRN
  Administered 2021-05-23: 2.5 mg via INTRAVITREAL

## 2021-05-23 NOTE — Assessment & Plan Note (Signed)
OS improving macular condition now status post 2 injections of Avastin, repeat injection today at 5-week interval and examination again in 5 weeks

## 2021-05-23 NOTE — Assessment & Plan Note (Signed)
No signs of CNVM OD 

## 2021-05-23 NOTE — Progress Notes (Signed)
05/23/2021     CHIEF COMPLAINT Patient presents for  Chief Complaint  Patient presents with   Retina Follow Up      HISTORY OF PRESENT ILLNESS: Melissa Elliott is a 80 y.o. female who presents to the clinic today for:   HPI     Retina Follow Up   Patient presents with  Wet AMD.  In left eye.  This started 6 weeks ago.  Duration of 6 weeks.  Since onset it is stable.      Last edited by Reather Littler, COA on 05/23/2021  3:43 PM.      Referring physician: Wenda Low, MD [address],  [place] [address]  HISTORICAL INFORMATION:   Selected notes from the [facility]: No current outpatient medications on file. (Ophthalmic Drugs)   No current facility-administered medications for this visit. (Ophthalmic Drugs)   Current Outpatient Medications (Other)  Medication Sig   ADVAIR DISKUS 250-50 MCG/DOSE AEPB Inhale 1 puff into the lungs 2 (two) times daily. Only uses in winter and spring months   albuterol (PROVENTIL HFA;VENTOLIN HFA) 108 (90 Base) MCG/ACT inhaler Inhale 2 puffs into the lungs every 6 (six) hours as needed for wheezing.   aspirin 81 MG EC tablet Take 81 mg by mouth daily. Swallow whole.   Calcium Carbonate-Vitamin D (CALCIUM-D) 600-400 MG-UNIT TABS Take 1 tablet by mouth daily in the afternoon.    cholecalciferol (VITAMIN D) 1000 units tablet Take 1,000 Units by mouth daily with lunch.   clopidogrel (PLAVIX) 75 MG tablet TAKE 1 TABLET BY MOUTH EVERY DAY WITH BREAKFAST   fexofenadine (ALLEGRA) 180 MG tablet Take 180 mg by mouth daily.   irbesartan (AVAPRO) 150 MG tablet TAKE 1 TABLET(150 MG) BY MOUTH DAILY   metFORMIN (GLUCOPHAGE) 500 MG tablet Take 500 mg by mouth daily. with food   metoprolol succinate (TOPROL-XL) 50 MG 24 hr tablet Take 50 mg by mouth daily.    montelukast (SINGULAIR) 10 MG tablet Take 10 mg by mouth at bedtime.   Multiple Vitamins-Minerals (PRESERVISION AREDS 2) CAPS  Take 1 capsule by mouth 2 (two) times daily.   nitroGLYCERIN (NITROSTAT) 0.4 MG SL tablet Place 0.4 mg under the tongue every 5 (five) minutes as needed for chest pain (UP TO 3 DOSES BEFORE CALLING EMS).   simvastatin (ZOCOR) 40 MG tablet Take 40 mg by mouth at bedtime.    No current facility-administered medications for this visit. (Other)      REVIEW OF SYSTEMS:    ALLERGIES Allergies  Allergen Reactions   Ciprofloxacin Nausea And Vomiting    Severe "dry heaves"   Sulfa Antibiotics Swelling    Swelling in face, neck, arms, hands.    Iodine Hives   Eggs Or Egg-Derived Products Nausea And Vomiting    "sometimes I can eat eggs; sometimes I can't"   Penicillins     As a child Has patient had a PCN reaction causing immediate rash, facial/tongue/throat swelling, SOB or lightheadedness with hypotension: Unknown Has patient had a PCN reaction causing severe rash involving mucus membranes or skin necrosis: Unknown Has patient had a PCN reaction that required hospitalization: Unknown Has patient had a PCN reaction occurring within the last 10 years: Unknown If all of the above answers are "NO", then may proceed with Cephalosporin use.     PAST MEDICAL HISTORY Past Medical History:  Diagnosis Date   Asthma    Complication  of anesthesia    "takes a long time to wakeup; sometimes days" (05/11/2018)   Coronary artery disease    H/O hiatal hernia    Heart murmur    High cholesterol    "on RX because of the bypass" (02/01/2013)   History of duodenal ulcer    Hot flashes    "on a regular basis" (05/11/2018)   Hypertension    Hypoglycemia    "that's something I have to watch constantly" (02/01/2013)   Migraine    "probably stopped w/menses" (05/11/2018)   Pneumonia ? 1970's   "once" (05/11/2018)   Sciatic nerve pain 12/2012   "RLE; just had it once" (05/11/2018)   Past Surgical History:  Procedure Laterality Date   ABDOMINAL AORTOGRAM W/LOWER EXTREMITY N/A 02/08/2020    Procedure: ABDOMINAL AORTOGRAM W/LOWER EXTREMITY;  Surgeon: Wellington Hampshire, MD;  Location: [facility];  Service: Cardiovascular;  Laterality: N/A;   APPENDECTOMY  1970   BREAST BIOPSY Left    benign   COLONOSCOPY WITH PROPOFOL N/A 06/19/2015   Procedure: COLONOSCOPY WITH PROPOFOL;  Surgeon: Garlan Fair, MD;  Location: [facility];  Service: Endoscopy;  Laterality: N/A;   CORONARY ANGIOPLASTY WITH STENT PLACEMENT  05/11/2018   CORONARY ANGIOPLASTY WITH STENT PLACEMENT  1997   CORONARY ARTERY BYPASS GRAFT  ~ 1997   CABG X2   CORONARY STENT INTERVENTION N/A 05/11/2018   Procedure: CORONARY STENT INTERVENTION;  Surgeon: Jettie Booze, MD;  Location: [facility];  Service: Cardiovascular;  Laterality: N/A;   ESOPHAGOGASTRODUODENOSCOPY N/A 02/02/2013   Procedure: ESOPHAGOGASTRODUODENOSCOPY (EGD);  Surgeon: Wonda Horner, MD;  Location: [facility];  Service: Endoscopy;  Laterality: N/A;   LEFT HEART CATH AND CORS/GRAFTS ANGIOGRAPHY N/A 05/11/2018   Procedure: LEFT HEART CATH AND CORS/GRAFTS ANGIOGRAPHY;  Surgeon: Jettie Booze, MD;  Location: [facility];  Service: Cardiovascular;  Laterality: N/A;   LEFT HEART CATH AND CORS/GRAFTS ANGIOGRAPHY N/A 05/14/2018   Procedure: LEFT HEART CATH AND CORS/GRAFTS ANGIOGRAPHY;  Surgeon: Jettie Booze, MD;  Location: [facility];  Service: Cardiovascular;  Laterality: N/A;   PERIPHERAL VASCULAR ATHERECTOMY Left 02/08/2020   Procedure: PERIPHERAL VASCULAR ATHERECTOMY;  Surgeon: Wellington Hampshire, MD;  Location: [facility];  Service: Cardiovascular;  Laterality: Left;  sfa (distal)   PERIPHERAL VASCULAR BALLOON ANGIOPLASTY Left 02/08/2020   Procedure: PERIPHERAL VASCULAR BALLOON ANGIOPLASTY;  Surgeon: Wellington Hampshire, MD;  Location: [facility];  Service: Cardiovascular;  Laterality: Left;  sfa (distal)   SKIN LESION EXCISION Left 1960's   "upper arm; sore that never healed" (02/01/2013)    TONSILLECTOMY  1940's    FAMILY HISTORY Family History  Problem Relation Age of Onset   CAD Mother    Hypertension Mother    Asthma Mother    Heart attack Father    Hypertension Father    Hypertension Maternal Grandmother    Hypertension Maternal Grandfather    Breast cancer Paternal Aunt    Stroke Neg Hx     SOCIAL HISTORY Social History   Tobacco Use   Smoking status: Never   Smokeless tobacco: Never   Tobacco comments:    Exposure through parents.  Vaping Use   Vaping Use: Never used  Substance Use Topics   Alcohol use: Yes    Alcohol/week: 0.0 standard drinks    Comment: 05/11/2018  "glass of wine maybe once/month"   Drug use: Never         OPHTHALMIC  EXAM:  Base Eye Exam     Visual Acuity (ETDRS)       Right Left   Dist cc 20/30 +1 20/30 +2   Dist ph cc NI NI    Correction: Glasses         Tonometry (Tonopen, 3:52 PM)       Right Left   Pressure 15 15         Pupils       Pupils Dark Light Shape React APD   Right PERRL 4 3 Round Brisk None   Left PERRL 4 3 Round Brisk None         Visual Fields (Counting fingers)       Left Right    Full Full         Extraocular Movement       Right Left    Full, Ortho Full, Ortho         Neuro/Psych     Oriented x3: Yes   Mood/Affect: Normal         Dilation     Both eyes: 1.0% Mydriacyl, 2.5% Phenylephrine @ 3:50 PM           Slit Lamp and Fundus Exam     External Exam       Right Left   External Normal Normal         Slit Lamp Exam       Right Left   Lids/Lashes Normal Normal   Conjunctiva/Sclera White and quiet White and quiet   Cornea Clear Clear   Anterior Chamber Deep and quiet Deep and quiet   Iris Round and reactive Round and reactive   Lens Clear Clear   Anterior Vitreous Normal Normal         Fundus Exam       Right Left   Posterior Vitreous  Normal   Disc  Normal   C/D Ratio  0.55   Macula  Macular thickening, no exudates, no  hemorrhage, Choroidal neovascular membrane with PED, Retinal pigment epithelial detachment   Vessels  Normal   Periphery  Normal            IMAGING AND PROCEDURES  Imaging and Procedures for 05/23/21  OCT, Retina - OU - Both Eyes       Right Eye Quality was good. Scan locations included subfoveal. Central Foveal Thickness: 244. Progression has been stable. Findings include normal foveal contour, retinal drusen .   Left Eye Quality was good. Scan locations included subfoveal. Central Foveal Thickness: 429. Progression has improved. Findings include abnormal foveal contour, intraretinal fluid, subretinal fluid, pigment epithelial detachment, retinal drusen .   Notes Recently discovered vascularized pigment epithelial detachment, wet AMD, with subretinal fluid and significant foveal elevation, OS, improved post Avastin No. 2, slightly improved thickening  OD no sign of CNVM     Intravitreal Injection, Pharmacologic Agent - OS - Left Eye       Time Out 05/23/2021. 4:12 PM. Confirmed correct patient, procedure, site, and patient consented.   Anesthesia Subconjunctival anesthesia was used. Anesthetic medications included Akten 3.5%.   Procedure Preparation included 10% betadine to eyelids, 5% betadine to ocular surface, Tobramycin 0.3%. A 30 gauge needle was used.   Injection: 2.5 mg bevacizumab 2.5 MG/0.1ML   Route: Intravitreal, Site: Left Eye   NDC: [phone], Lot: [id number]   Post-op Post injection exam found visual acuity of at least counting fingers. The patient tolerated the procedure well. There were no complications. The  patient received written and verbal post procedure care education. Post injection medications were not given.              ASSESSMENT/PLAN:  Exudative age-related macular degeneration of left eye with active choroidal neovascularization (HCC) OS improving macular condition now status post 2 injections of Avastin, repeat injection today  at 5-week interval and examination again in 5 weeks  Intermediate stage nonexudative age-related macular degeneration of both eyes No signs of CNVM OD     ICD-10-CM   1. Exudative age-related macular degeneration of left eye with active choroidal neovascularization (HCC)  H35.3221 OCT, Retina - OU - Both Eyes    Intravitreal Injection, Pharmacologic Agent - OS - Left Eye    bevacizumab (AVASTIN) SOSY 2.5 mg    2. Intermediate stage nonexudative age-related macular degeneration of both eyes  H35.3132       1.  OS, improving slowly anatomy from wet AMD with subfoveal vascularized PED and subretinal fluid.  At 5-week interval today.  Stable acuity.  Repeat injection today and maintain 5-week interval  2.  3.  Ophthalmic Meds Ordered this visit:  Meds ordered this encounter  Medications   bevacizumab (AVASTIN) SOSY 2.5 mg       Return in about 5 weeks (around 06/27/2021) for dilate, OS, AVASTIN OCT.  There are no Patient Instructions on file for this visit.   Explained the diagnoses, plan, and follow up with the patient and they expressed understanding.  Patient expressed understanding of the importance of proper follow up care.   Clent Demark Glendon Dunwoody M.D. Diseases & Surgery of the Retina and Vitreous [facility] 05/23/21     Abbreviations: M myopia (nearsighted); A astigmatism; H hyperopia (farsighted); P presbyopia; Mrx spectacle prescription;  CTL contact lenses; OD right eye; OS left eye; OU both eyes  XT exotropia; ET esotropia; PEK punctate epithelial keratitis; PEE punctate epithelial erosions; DES dry eye syndrome; MGD meibomian gland dysfunction; ATs artificial tears; PFAT's preservative free artificial tears; [place] nuclear sclerotic cataract; PSC posterior subcapsular cataract; ERM epi-retinal membrane; PVD posterior vitreous detachment; RD retinal detachment; DM diabetes mellitus; DR diabetic retinopathy; NPDR non-proliferative diabetic retinopathy; PDR  proliferative diabetic retinopathy; CSME clinically significant macular edema; DME diabetic macular edema; dbh dot blot hemorrhages; CWS cotton wool spot; POAG primary open angle glaucoma; C/D cup-to-disc ratio; HVF humphrey visual field; GVF goldmann visual field; OCT optical coherence tomography; IOP intraocular pressure; BRVO Branch retinal vein occlusion; CRVO central retinal vein occlusion; CRAO central retinal artery occlusion; BRAO branch retinal artery occlusion; RT retinal tear; SB scleral buckle; PPV pars plana vitrectomy; VH Vitreous hemorrhage; PRP panretinal laser photocoagulation; IVK intravitreal kenalog; VMT vitreomacular traction; MH Macular hole;  NVD neovascularization of the disc; NVE neovascularization elsewhere; AREDS age related eye disease study; ARMD age related macular degeneration; POAG primary open angle glaucoma; EBMD epithelial/anterior basement membrane dystrophy; ACIOL anterior chamber intraocular lens; IOL intraocular lens; PCIOL posterior chamber intraocular lens; Phaco/IOL phacoemulsification with intraocular lens placement; [place] photorefractive keratectomy; LASIK laser assisted in situ keratomileusis; HTN hypertension; DM diabetes mellitus; COPD chronic obstructive pulmonary disease

## 2021-05-24 DIAGNOSIS — J45901 Unspecified asthma with (acute) exacerbation: Secondary | ICD-10-CM | POA: Diagnosis not present

## 2021-06-26 DIAGNOSIS — D2272 Melanocytic nevi of left lower limb, including hip: Secondary | ICD-10-CM | POA: Diagnosis not present

## 2021-06-26 DIAGNOSIS — D485 Neoplasm of uncertain behavior of skin: Secondary | ICD-10-CM | POA: Diagnosis not present

## 2021-06-26 DIAGNOSIS — L578 Other skin changes due to chronic exposure to nonionizing radiation: Secondary | ICD-10-CM | POA: Diagnosis not present

## 2021-06-26 DIAGNOSIS — D2262 Melanocytic nevi of left upper limb, including shoulder: Secondary | ICD-10-CM | POA: Diagnosis not present

## 2021-06-26 DIAGNOSIS — Z86018 Personal history of other benign neoplasm: Secondary | ICD-10-CM | POA: Diagnosis not present

## 2021-06-27 ENCOUNTER — Encounter (INDEPENDENT_AMBULATORY_CARE_PROVIDER_SITE_OTHER): Payer: Self-pay | Admitting: Ophthalmology

## 2021-06-27 ENCOUNTER — Other Ambulatory Visit: Payer: Self-pay

## 2021-06-27 ENCOUNTER — Ambulatory Visit (INDEPENDENT_AMBULATORY_CARE_PROVIDER_SITE_OTHER): Payer: Medicare HMO | Admitting: Ophthalmology

## 2021-06-27 DIAGNOSIS — H353221 Exudative age-related macular degeneration, left eye, with active choroidal neovascularization: Secondary | ICD-10-CM | POA: Diagnosis not present

## 2021-06-27 MED ORDER — BEVACIZUMAB 2.5 MG/0.1ML IZ SOSY
2.5000 mg | PREFILLED_SYRINGE | INTRAVITREAL | Status: AC | PRN
  Administered 2021-06-27: 2.5 mg via INTRAVITREAL

## 2021-06-27 NOTE — Progress Notes (Signed)
06/27/2021     CHIEF COMPLAINT Patient presents for  Chief Complaint  Patient presents with   Retina Follow Up      HISTORY OF PRESENT ILLNESS: Melissa Elliott is a 80 y.o. female who presents to the clinic today for:   HPI     Retina Follow Up           Diagnosis: Wet AMD   Laterality: left eye   Onset: 5 weeks ago   Severity: mild   Duration: 5 weeks   Course: stable         Comments   5 week fu os and oct and Avastin OS  Pt states VA OU stable since last visit. Pt denies FOL, floaters, or ocular pain OU.  Pt states, "I cannot tell any change at all."        Last edited by Kendra Opitz, COA on 06/27/2021  2:44 PM.      Referring physician: Wenda Low, MD [address],  [place] [id number]  HISTORICAL INFORMATION:   Selected notes from the [facility]: No current outpatient medications on file. (Ophthalmic Drugs)   No current facility-administered medications for this visit. (Ophthalmic Drugs)   Current Outpatient Medications (Other)  Medication Sig   ADVAIR DISKUS 250-50 MCG/DOSE AEPB Inhale 1 puff into the lungs 2 (two) times daily. Only uses in winter and spring months   albuterol (PROVENTIL HFA;VENTOLIN HFA) 108 (90 Base) MCG/ACT inhaler Inhale 2 puffs into the lungs every 6 (six) hours as needed for wheezing.   aspirin 81 MG EC tablet Take 81 mg by mouth daily. Swallow whole.   Calcium Carbonate-Vitamin D (CALCIUM-D) 600-400 MG-UNIT TABS Take 1 tablet by mouth daily in the afternoon.    cholecalciferol (VITAMIN D) 1000 units tablet Take 1,000 Units by mouth daily with lunch.   clopidogrel (PLAVIX) 75 MG tablet TAKE 1 TABLET BY MOUTH EVERY DAY WITH BREAKFAST   fexofenadine (ALLEGRA) 180 MG tablet Take 180 mg by mouth daily.   irbesartan (AVAPRO) 150 MG tablet TAKE 1 TABLET(150 MG) BY MOUTH DAILY   metFORMIN (GLUCOPHAGE) 500 MG tablet Take 500 mg by mouth daily. with food    metoprolol succinate (TOPROL-XL) 50 MG 24 hr tablet Take 50 mg by mouth daily.    montelukast (SINGULAIR) 10 MG tablet Take 10 mg by mouth at bedtime.   Multiple Vitamins-Minerals (PRESERVISION AREDS 2) CAPS Take 1 capsule by mouth 2 (two) times daily.   nitroGLYCERIN (NITROSTAT) 0.4 MG SL tablet Place 0.4 mg under the tongue every 5 (five) minutes as needed for chest pain (UP TO 3 DOSES BEFORE CALLING EMS).   simvastatin (ZOCOR) 40 MG tablet Take 40 mg by mouth at bedtime.    No current facility-administered medications for this visit. (Other)      REVIEW OF SYSTEMS:    ALLERGIES Allergies  Allergen Reactions   Ciprofloxacin Nausea And Vomiting    Severe "dry heaves"   Sulfa Antibiotics Swelling    Swelling in face, neck, arms, hands.    Iodine Hives   Eggs Or Egg-Derived Products Nausea And Vomiting    "sometimes I can eat eggs; sometimes I can't"   Penicillins     As a child Has patient had a PCN reaction causing immediate rash, facial/tongue/throat swelling, SOB or lightheadedness with hypotension: Unknown Has patient had a PCN reaction causing severe rash involving mucus membranes or skin necrosis: Unknown  Has patient had a PCN reaction that required hospitalization: Unknown Has patient had a PCN reaction occurring within the last 10 years: Unknown If all of the above answers are "NO", then may proceed with Cephalosporin use.     PAST MEDICAL HISTORY Past Medical History:  Diagnosis Date   Asthma    Complication of anesthesia    "takes a long time to wakeup; sometimes days" (05/11/2018)   Coronary artery disease    H/O hiatal hernia    Heart murmur    High cholesterol    "on RX because of the bypass" (02/01/2013)   History of duodenal ulcer    Hot flashes    "on a regular basis" (05/11/2018)   Hypertension    Hypoglycemia    "that's something I have to watch constantly" (02/01/2013)   Migraine    "probably stopped w/menses" (05/11/2018)   Pneumonia ? 1970's    "once" (05/11/2018)   Sciatic nerve pain 12/2012   "RLE; just had it once" (05/11/2018)   Past Surgical History:  Procedure Laterality Date   ABDOMINAL AORTOGRAM W/LOWER EXTREMITY N/A 02/08/2020   Procedure: ABDOMINAL AORTOGRAM W/LOWER EXTREMITY;  Surgeon: Wellington Hampshire, MD;  Location: [facility];  Service: Cardiovascular;  Laterality: N/A;   APPENDECTOMY  1970   BREAST BIOPSY Left    benign   COLONOSCOPY WITH PROPOFOL N/A 06/19/2015   Procedure: COLONOSCOPY WITH PROPOFOL;  Surgeon: Garlan Fair, MD;  Location: [facility];  Service: Endoscopy;  Laterality: N/A;   CORONARY ANGIOPLASTY WITH STENT PLACEMENT  05/11/2018   CORONARY ANGIOPLASTY WITH STENT PLACEMENT  1997   CORONARY ARTERY BYPASS GRAFT  ~ 1997   CABG X2   CORONARY STENT INTERVENTION N/A 05/11/2018   Procedure: CORONARY STENT INTERVENTION;  Surgeon: Jettie Booze, MD;  Location: [facility];  Service: Cardiovascular;  Laterality: N/A;   ESOPHAGOGASTRODUODENOSCOPY N/A 02/02/2013   Procedure: ESOPHAGOGASTRODUODENOSCOPY (EGD);  Surgeon: Wonda Horner, MD;  Location: [facility];  Service: Endoscopy;  Laterality: N/A;   LEFT HEART CATH AND CORS/GRAFTS ANGIOGRAPHY N/A 05/11/2018   Procedure: LEFT HEART CATH AND CORS/GRAFTS ANGIOGRAPHY;  Surgeon: Jettie Booze, MD;  Location: [facility];  Service: Cardiovascular;  Laterality: N/A;   LEFT HEART CATH AND CORS/GRAFTS ANGIOGRAPHY N/A 05/14/2018   Procedure: LEFT HEART CATH AND CORS/GRAFTS ANGIOGRAPHY;  Surgeon: Jettie Booze, MD;  Location: [facility];  Service: Cardiovascular;  Laterality: N/A;   PERIPHERAL VASCULAR ATHERECTOMY Left 02/08/2020   Procedure: PERIPHERAL VASCULAR ATHERECTOMY;  Surgeon: Wellington Hampshire, MD;  Location: [facility];  Service: Cardiovascular;  Laterality: Left;  sfa (distal)   PERIPHERAL VASCULAR BALLOON ANGIOPLASTY Left 02/08/2020   Procedure: PERIPHERAL VASCULAR BALLOON ANGIOPLASTY;  Surgeon:  Wellington Hampshire, MD;  Location: [facility];  Service: Cardiovascular;  Laterality: Left;  sfa (distal)   SKIN LESION EXCISION Left 1960's   "upper arm; sore that never healed" (02/01/2013)   TONSILLECTOMY  1940's    FAMILY HISTORY Family History  Problem Relation Age of Onset   CAD Mother    Hypertension Mother    Asthma Mother    Heart attack Father    Hypertension Father    Hypertension Maternal Grandmother    Hypertension Maternal Grandfather    Breast cancer Paternal Aunt    Stroke Neg Hx     SOCIAL HISTORY Social History   Tobacco Use   Smoking status: Never   Smokeless tobacco: Never   Tobacco  comments:    Exposure through parents.  Vaping Use   Vaping Use: Never used  Substance Use Topics   Alcohol use: Yes    Alcohol/week: 0.0 standard drinks    Comment: 05/11/2018  "glass of wine maybe once/month"   Drug use: Never         OPHTHALMIC EXAM:  Base Eye Exam     Visual Acuity (ETDRS)       Right Left   Dist cc 20/30 -2 20/30 +1   Dist ph cc NI NI    Correction: Glasses         Tonometry (Tonopen, 2:48 PM)       Right Left   Pressure 11 13         Pupils       Pupils Shape React APD   Right PERRL Round Brisk None   Left PERRL Round Brisk None         Visual Fields (Counting fingers)       Left Right    Full Full         Extraocular Movement       Right Left    Full, Ortho Full, Ortho         Neuro/Psych     Oriented x3: Yes   Mood/Affect: Normal         Dilation     Left eye: 1.0% Mydriacyl, 2.5% Phenylephrine @ 2:48 PM           Slit Lamp and Fundus Exam     External Exam       Right Left   External Normal Normal         Slit Lamp Exam       Right Left   Lids/Lashes Normal Normal   Conjunctiva/Sclera White and quiet White and quiet   Cornea Clear Clear   Anterior Chamber Deep and quiet Deep and quiet   Iris Round and reactive Round and reactive   Lens Clear Clear   Anterior  Vitreous Normal Normal         Fundus Exam       Right Left   Posterior Vitreous  Normal   Disc  Normal   C/D Ratio  0.55   Macula  Macular thickening, no exudates, no hemorrhage, Choroidal neovascular membrane with PED, Retinal pigment epithelial detachment   Vessels  Normal   Periphery  Normal            IMAGING AND PROCEDURES  Imaging and Procedures for 06/27/21  OCT, Retina - OU - Both Eyes       Right Eye Quality was good. Scan locations included subfoveal. Central Foveal Thickness: 246. Progression has been stable. Findings include normal foveal contour, retinal drusen .   Left Eye Quality was good. Scan locations included subfoveal. Central Foveal Thickness: 392. Progression has improved. Findings include abnormal foveal contour, intraretinal fluid, subretinal fluid, pigment epithelial detachment, retinal drusen .   Notes Recently discovered vascularized pigment epithelial detachment, wet AMD, with subretinal fluid and significant foveal elevation, OS,  slightly improved thickening  OD no sign of CNVM     Intravitreal Injection, Pharmacologic Agent - OS - Left Eye       Time Out 06/27/2021. 3:30 PM. Confirmed correct patient, procedure, site, and patient consented.   Anesthesia Subconjunctival anesthesia was used. Anesthetic medications included Lidocaine 4%.   Procedure Preparation included 10% betadine to eyelids, 5% betadine to ocular surface, Tobramycin 0.3%. A 30 gauge needle was  used.   Injection: 2.5 mg bevacizumab 2.5 MG/0.1ML   Route: Intravitreal, Site: Left Eye   NDC: [phone], Lot: [id number]   Post-op Post injection exam found visual acuity of at least counting fingers. The patient tolerated the procedure well. There were no complications. The patient received written and verbal post procedure care education. Post injection medications were not given.              ASSESSMENT/PLAN:  Exudative age-related macular degeneration of  left eye with active choroidal neovascularization ([place]) OS vastly improved since onset of therapy August 2022.  Preserved acuity.  Subfoveal pigment epithelial detachment does remain.  Currently at interval follow-up of 5 weeks we will consider interval change to 6 weeks next     ICD-10-CM   1. Exudative age-related macular degeneration of left eye with active choroidal neovascularization (HCC)  H35.3221 OCT, Retina - OU - Both Eyes    Intravitreal Injection, Pharmacologic Agent - OS - Left Eye    bevacizumab (AVASTIN) SOSY 2.5 mg      1.  OS vastly improved with preserved acuity we will repeat injection intravitreal Avastin today to maintain at 5-week interval  2.  Dilate follow-up OS next in 6 weeks  3.  Ophthalmic Meds Ordered this visit:  Meds ordered this encounter  Medications   bevacizumab (AVASTIN) SOSY 2.5 mg       Return in about 6 weeks (around 08/08/2021) for dilate, OS, AVASTIN OCT.  There are no Patient Instructions on file for this visit.   Explained the diagnoses, plan, and follow up with the patient and they expressed understanding.  Patient expressed understanding of the importance of proper follow up care.   Clent Demark Sender Rueb M.D. Diseases & Surgery of the Retina and Vitreous [facility]ina [facility] Di[facility] [place] 06/27/21     Abbreviations: M myopia (nearsighted); A astigmatism; H hyperopia (farsighted); P presbyopia; Mrx spectacle prescription;  CTL contact lenses; OD right eye; OS left eye; OU both eyes  XT exotropia; ET esotropia; PEK punctate epithelial keratitis; PEE punctate epithelial erosions; DES dry eye syndrome; MGD meibomian gland dysfunction; ATs artificial tears; PFAT's preservative free artificial tears; [place] nuclear sclerotic cataract; PSC posterior subcapsular cataract; ERM epi-retinal membrane; PVD posterior vitreous detachment; RD retinal detachment; DM diabetes mellitus; DR diabetic retinopathy; NPDR non-proliferative diabetic retinopathy; PDR  proliferative diabetic retinopathy; CSME clinically significant macular edema; DME diabetic macular edema; dbh dot blot hemorrhages; CWS cotton wool spot; POAG primary open angle glaucoma; C/D cup-to-disc ratio; HVF humphrey visual field; GVF goldmann visual field; OCT optical coherence tomography; IOP intraocular pressure; BRVO Branch retinal vein occlusion; CRVO central retinal vein occlusion; CRAO central retinal artery occlusion; BRAO branch retinal artery occlusion; RT retinal tear; SB scleral buckle; PPV pars plana vitrectomy; VH Vitreous hemorrhage; PRP panretinal laser photocoagulation; IVK intravitreal kenalog; VMT vitreomacular traction; MH Macular hole;  NVD neovascularization of the disc; NVE neovascularization elsewhere; AREDS age related eye disease study; ARMD age related macular degeneration; POAG primary open angle glaucoma; EBMD epithelial/anterior basement membrane dystrophy; ACIOL anterior chamber intraocular lens; IOL intraocular lens; PCIOL posterior chamber intraocular lens; Phaco/IOL phacoemulsification with intraocular lens placement; [place] photorefractive keratectomy; LASIK laser assisted in situ keratomileusis; HTN hypertension; DM diabetes mellitus; COPD chronic obstructive pulmonary disease

## 2021-06-27 NOTE — Assessment & Plan Note (Signed)
OS vastly improved since onset of therapy August 2022.  Preserved acuity.  Subfoveal pigment epithelial detachment does remain.  Currently at interval follow-up of 5 weeks we will consider interval change to 6 weeks next

## 2021-07-27 ENCOUNTER — Other Ambulatory Visit: Payer: Self-pay | Admitting: Interventional Cardiology

## 2021-07-30 ENCOUNTER — Other Ambulatory Visit: Payer: Self-pay | Admitting: Internal Medicine

## 2021-07-30 DIAGNOSIS — Z1231 Encounter for screening mammogram for malignant neoplasm of breast: Secondary | ICD-10-CM

## 2021-08-08 ENCOUNTER — Other Ambulatory Visit: Payer: Self-pay

## 2021-08-08 ENCOUNTER — Encounter (INDEPENDENT_AMBULATORY_CARE_PROVIDER_SITE_OTHER): Payer: Self-pay | Admitting: Ophthalmology

## 2021-08-08 ENCOUNTER — Ambulatory Visit (INDEPENDENT_AMBULATORY_CARE_PROVIDER_SITE_OTHER): Payer: Medicare HMO | Admitting: Ophthalmology

## 2021-08-08 DIAGNOSIS — H353221 Exudative age-related macular degeneration, left eye, with active choroidal neovascularization: Secondary | ICD-10-CM

## 2021-08-08 MED ORDER — BEVACIZUMAB 2.5 MG/0.1ML IZ SOSY
2.5000 mg | PREFILLED_SYRINGE | INTRAVITREAL | Status: AC | PRN
  Administered 2021-08-08: 2.5 mg via INTRAVITREAL

## 2021-08-08 NOTE — Assessment & Plan Note (Signed)
OS, vastly improved since onset of therapy multilobular PED is now to a single lobe of PED but with accompanying subretinal fluid.  Stable acuity.  Repeat intravitreal Avastin OS today currently at interval follow-up of 6 weeks.  And follow-up next and likely change to [facility] injection OS

## 2021-08-08 NOTE — Progress Notes (Signed)
08/08/2021     CHIEF COMPLAINT Patient presents for  Chief Complaint  Patient presents with   Retina Follow Up      HISTORY OF PRESENT ILLNESS: Melissa Elliott is a 81 y.o. female who presents to the clinic today for:   HPI     Retina Follow Up           Diagnosis: Wet AMD   Laterality: left eye   Onset: 6 weeks ago   Severity: mild   Duration: 6 weeks   Course: stable         Comments   6 week fu dilate OS and OCT and Avastin OS  Pt states VA OU stable since last visit. Pt denies FOL, floaters, or ocular pain OU.  Pt states, "My vision seems fine, no change. "        Last edited by Kendra Opitz, COA on 08/08/2021  2:25 PM.      Referring physician: Wenda Low, MD [address] [place],  [place] [address]  HISTORICAL INFORMATION:   Selected notes from the [facility]: No current outpatient medications on file. (Ophthalmic Drugs)   No current facility-administered medications for this visit. (Ophthalmic Drugs)   Current Outpatient Medications (Other)  Medication Sig   ADVAIR DISKUS 250-50 MCG/DOSE AEPB Inhale 1 puff into the lungs 2 (two) times daily. Only uses in winter and spring months   albuterol (PROVENTIL HFA;VENTOLIN HFA) 108 (90 Base) MCG/ACT inhaler Inhale 2 puffs into the lungs every 6 (six) hours as needed for wheezing.   aspirin 81 MG EC tablet Take 81 mg by mouth daily. Swallow whole.   Calcium Carbonate-Vitamin D (CALCIUM-D) 600-400 MG-UNIT TABS Take 1 tablet by mouth daily in the afternoon.    cholecalciferol (VITAMIN D) 1000 units tablet Take 1,000 Units by mouth daily with lunch.   clopidogrel (PLAVIX) 75 MG tablet TAKE 1 TABLET BY MOUTH EVERY DAY WITH BREAKFAST   fexofenadine (ALLEGRA) 180 MG tablet Take 180 mg by mouth daily.   irbesartan (AVAPRO) 150 MG tablet TAKE 1 TABLET(150 MG) BY MOUTH DAILY   metFORMIN (GLUCOPHAGE) 500 MG tablet Take 500 mg by mouth daily. with food    metoprolol succinate (TOPROL-XL) 50 MG 24 hr tablet Take 50 mg by mouth daily.    montelukast (SINGULAIR) 10 MG tablet Take 10 mg by mouth at bedtime.   Multiple Vitamins-Minerals (PRESERVISION AREDS 2) CAPS Take 1 capsule by mouth 2 (two) times daily.   nitroGLYCERIN (NITROSTAT) 0.4 MG SL tablet Place 0.4 mg under the tongue every 5 (five) minutes as needed for chest pain (UP TO 3 DOSES BEFORE CALLING EMS).   simvastatin (ZOCOR) 40 MG tablet Take 40 mg by mouth at bedtime.    No current facility-administered medications for this visit. (Other)      REVIEW OF SYSTEMS:    ALLERGIES Allergies  Allergen Reactions   Ciprofloxacin Nausea And Vomiting    Severe "dry heaves"   Sulfa Antibiotics Swelling    Swelling in face, neck, arms, hands.    Iodine Hives   Eggs Or Egg-Derived Products Nausea And Vomiting    "sometimes I can eat eggs; sometimes I can't"   Penicillins     As a child Has patient had a PCN reaction causing immediate rash, facial/tongue/throat swelling, SOB or lightheadedness with hypotension: Unknown Has patient had a PCN reaction causing severe rash involving mucus membranes or skin necrosis:  Unknown Has patient had a PCN reaction that required hospitalization: Unknown Has patient had a PCN reaction occurring within the last 10 years: Unknown If all of the above answers are "NO", then may proceed with Cephalosporin use.     PAST MEDICAL HISTORY Past Medical History:  Diagnosis Date   Asthma    Complication of anesthesia    "takes a long time to wakeup; sometimes days" (05/11/2018)   Coronary artery disease    H/O hiatal hernia    Heart murmur    High cholesterol    "on RX because of the bypass" (02/01/2013)   History of duodenal ulcer    Hot flashes    "on a regular basis" (05/11/2018)   Hypertension    Hypoglycemia    "that's something I have to watch constantly" (02/01/2013)   Migraine    "probably stopped w/menses" (05/11/2018)   Pneumonia ? 1970's    "once" (05/11/2018)   Sciatic nerve pain 12/2012   "RLE; just had it once" (05/11/2018)   Past Surgical History:  Procedure Laterality Date   ABDOMINAL AORTOGRAM W/LOWER EXTREMITY N/A 02/08/2020   Procedure: ABDOMINAL AORTOGRAM W/LOWER EXTREMITY;  Surgeon: Wellington Hampshire, MD;  Location: [facility];  Service: Cardiovascular;  Laterality: N/A;   APPENDECTOMY  1970   BREAST BIOPSY Left    benign   COLONOSCOPY WITH PROPOFOL N/A 06/19/2015   Procedure: COLONOSCOPY WITH PROPOFOL;  Surgeon: Garlan Fair, MD;  Location: [facility];  Service: Endoscopy;  Laterality: N/A;   CORONARY ANGIOPLASTY WITH STENT PLACEMENT  05/11/2018   CORONARY ANGIOPLASTY WITH STENT PLACEMENT  1997   CORONARY ARTERY BYPASS GRAFT  ~ 1997   CABG X2   CORONARY STENT INTERVENTION N/A 05/11/2018   Procedure: CORONARY STENT INTERVENTION;  Surgeon: Jettie Booze, MD;  Location: [facility];  Service: Cardiovascular;  Laterality: N/A;   ESOPHAGOGASTRODUODENOSCOPY N/A 02/02/2013   Procedure: ESOPHAGOGASTRODUODENOSCOPY (EGD);  Surgeon: Wonda Horner, MD;  Location: [facility];  Service: Endoscopy;  Laterality: N/A;   LEFT HEART CATH AND CORS/GRAFTS ANGIOGRAPHY N/A 05/11/2018   Procedure: LEFT HEART CATH AND CORS/GRAFTS ANGIOGRAPHY;  Surgeon: Jettie Booze, MD;  Location: [facility];  Service: Cardiovascular;  Laterality: N/A;   LEFT HEART CATH AND CORS/GRAFTS ANGIOGRAPHY N/A 05/14/2018   Procedure: LEFT HEART CATH AND CORS/GRAFTS ANGIOGRAPHY;  Surgeon: Jettie Booze, MD;  Location: [facility];  Service: Cardiovascular;  Laterality: N/A;   PERIPHERAL VASCULAR ATHERECTOMY Left 02/08/2020   Procedure: PERIPHERAL VASCULAR ATHERECTOMY;  Surgeon: Wellington Hampshire, MD;  Location: [facility];  Service: Cardiovascular;  Laterality: Left;  sfa (distal)   PERIPHERAL VASCULAR BALLOON ANGIOPLASTY Left 02/08/2020   Procedure: PERIPHERAL VASCULAR BALLOON ANGIOPLASTY;  Surgeon:  Wellington Hampshire, MD;  Location: [facility];  Service: Cardiovascular;  Laterality: Left;  sfa (distal)   SKIN LESION EXCISION Left 1960's   "upper arm; sore that never healed" (02/01/2013)   TONSILLECTOMY  1940's    FAMILY HISTORY Family History  Problem Relation Age of Onset   CAD Mother    Hypertension Mother    Asthma Mother    Heart attack Father    Hypertension Father    Hypertension Maternal Grandmother    Hypertension Maternal Grandfather    Breast cancer Paternal Aunt    Stroke Neg Hx     SOCIAL HISTORY Social History   Tobacco Use   Smoking status: Never   Smokeless tobacco: Never  Tobacco comments:    Exposure through parents.  Vaping Use   Vaping Use: Never used  Substance Use Topics   Alcohol use: Yes    Alcohol/week: 0.0 standard drinks    Comment: 05/11/2018  "glass of wine maybe once/month"   Drug use: Never         OPHTHALMIC EXAM:  Base Eye Exam     Visual Acuity (ETDRS)       Right Left   Dist cc 20/40 20/25 -1   Dist ph cc 20/30     Correction: Glasses         Tonometry (Tonopen, 2:29 PM)       Right Left   Pressure 15 14         Pupils       Pupils Dark Light Shape React APD   Right PERRL 4 3 Round Brisk None   Left PERRL 4 3 Round Brisk None         Visual Fields (Counting fingers)       Left Right    Full Full         Extraocular Movement       Right Left    Full, Ortho Full, Ortho         Neuro/Psych     Oriented x3: Yes   Mood/Affect: Normal         Dilation     Left eye: 1.0% Mydriacyl, 2.5% Phenylephrine @ 2:29 PM           Slit Lamp and Fundus Exam     External Exam       Right Left   External Normal Normal         Slit Lamp Exam       Right Left   Lids/Lashes Normal Normal   Conjunctiva/Sclera White and quiet White and quiet   Cornea Clear Clear   Anterior Chamber Deep and quiet Deep and quiet   Iris Round and reactive Round and reactive   Lens Clear Clear    Anterior Vitreous Normal Normal         Fundus Exam       Right Left   Posterior Vitreous  Normal   Disc  Normal   C/D Ratio  0.55   Macula  Macular thickening, no exudates, no hemorrhage, Choroidal neovascular membrane with PED, Retinal pigment epithelial detachment   Vessels  Normal   Periphery  Normal            IMAGING AND PROCEDURES  Imaging and Procedures for 08/08/21  OCT, Retina - OU - Both Eyes       Right Eye Quality was good. Scan locations included subfoveal. Central Foveal Thickness: 243. Progression has been stable. Findings include normal foveal contour, retinal drusen .   Left Eye Quality was good. Scan locations included subfoveal. Central Foveal Thickness: 432. Progression has improved. Findings include abnormal foveal contour, intraretinal fluid, subretinal fluid, pigment epithelial detachment, retinal drusen .   Notes Recently discovered vascularized pigment epithelial detachment, wet AMD, with subretinal fluid and significant foveal elevation, OS,  slightly improved thickening  OD no sign of CNVM     Intravitreal Injection, Pharmacologic Agent - OS - Left Eye       Time Out 08/08/2021. 3:34 PM. Confirmed correct patient, procedure, site, and patient consented.   Anesthesia Subconjunctival anesthesia was used. Anesthetic medications included Lidocaine 4%.   Procedure Preparation included 10% betadine to eyelids, 5% betadine to ocular surface, Tobramycin  0.3%. A 30 gauge needle was used.   Injection: 2.5 mg bevacizumab 2.5 MG/0.1ML   Route: Intravitreal, Site: Left Eye   NDC: [phone], Lot: [id number]   Post-op Post injection exam found visual acuity of at least counting fingers. The patient tolerated the procedure well. There were no complications. The patient received written and verbal post procedure care education. Post injection medications were not given.              ASSESSMENT/PLAN:  Exudative age-related macular  degeneration of left eye with active choroidal neovascularization (HCC) OS, vastly improved since onset of therapy multilobular PED is now to a single lobe of PED but with accompanying subretinal fluid.  Stable acuity.  Repeat intravitreal Avastin OS today currently at interval follow-up of 6 weeks.  And follow-up next and likely change to [facility] injection OS      ICD-10-CM   1. Exudative age-related macular degeneration of left eye with active choroidal neovascularization (HCC)  H35.3221 OCT, Retina - OU - Both Eyes    Intravitreal Injection, Pharmacologic Agent - OS - Left Eye    bevacizumab (AVASTIN) SOSY 2.5 mg      OS vastly improved macular findings and stable acuity on intravitreal Avastin and still active.  Repeat injection today and will change to [facility] next pending financial choices made by patient as well as availability of "good days"  2.  3.  Ophthalmic Meds Ordered this visit:  Meds ordered this encounter  Medications   bevacizumab (AVASTIN) SOSY 2.5 mg       Return in about 5 weeks (around 09/12/2021) for dilate, OS, EYLEA OCT, a change from Avastin.  There are no Patient Instructions on file for this visit.   Explained the diagnoses, plan, and follow up with the patient and they expressed understanding.  Patient expressed understanding of the importance of proper follow up care.   Clent Demark Lady Wisham M.D. Diseases & Surgery of the Retina and Vitreous [facility] [place] 08/08/21     Abbreviations: M myopia (nearsighted); A astigmatism; H hyperopia (farsighted); P presbyopia; Mrx spectacle prescription;  CTL contact lenses; OD right eye; OS left eye; OU both eyes  XT exotropia; ET esotropia; PEK punctate epithelial keratitis; PEE punctate epithelial erosions; DES dry eye syndrome; MGD meibomian gland dysfunction; ATs artificial tears; PFAT's preservative free artificial tears; [place] nuclear sclerotic cataract; PSC posterior subcapsular cataract; ERM epi-retinal  membrane; PVD posterior vitreous detachment; RD retinal detachment; DM diabetes mellitus; DR diabetic retinopathy; NPDR non-proliferative diabetic retinopathy; PDR proliferative diabetic retinopathy; CSME clinically significant macular edema; DME diabetic macular edema; dbh dot blot hemorrhages; CWS cotton wool spot; POAG primary open angle glaucoma; C/D cup-to-disc ratio; HVF humphrey visual field; GVF goldmann visual field; OCT optical coherence tomography; IOP intraocular pressure; BRVO Branch retinal vein occlusion; CRVO central retinal vein occlusion; CRAO central retinal artery occlusion; BRAO branch retinal artery occlusion; RT retinal tear; SB scleral buckle; PPV pars plana vitrectomy; VH Vitreous hemorrhage; PRP panretinal laser photocoagulation; IVK intravitreal kenalog; VMT vitreomacular traction; MH Macular hole;  NVD neovascularization of the disc; NVE neovascularization elsewhere; AREDS age related eye disease study; ARMD age related macular degeneration; POAG primary open angle glaucoma; EBMD epithelial/anterior basement membrane dystrophy; ACIOL anterior chamber intraocular lens; IOL intraocular lens; PCIOL posterior chamber intraocular lens; Phaco/IOL phacoemulsification with intraocular lens placement; [place] photorefractive keratectomy; LASIK laser assisted in situ keratomileusis; HTN hypertension; DM diabetes mellitus; COPD chronic obstructive pulmonary disease

## 2021-08-09 ENCOUNTER — Ambulatory Visit
Admission: RE | Admit: 2021-08-09 | Discharge: 2021-08-09 | Disposition: A | Discharge status: Home / Self Care | Payer: Medicare HMO | Source: Ambulatory Visit | Attending: Internal Medicine | Admitting: Internal Medicine

## 2021-08-09 DIAGNOSIS — Z1231 Encounter for screening mammogram for malignant neoplasm of breast: Secondary | ICD-10-CM

## 2021-08-28 DIAGNOSIS — L988 Other specified disorders of the skin and subcutaneous tissue: Secondary | ICD-10-CM | POA: Diagnosis not present

## 2021-08-28 DIAGNOSIS — D2372 Other benign neoplasm of skin of left lower limb, including hip: Secondary | ICD-10-CM | POA: Diagnosis not present

## 2021-09-10 DIAGNOSIS — M5416 Radiculopathy, lumbar region: Secondary | ICD-10-CM | POA: Diagnosis not present

## 2021-09-10 DIAGNOSIS — M4310 Spondylolisthesis, site unspecified: Secondary | ICD-10-CM | POA: Diagnosis not present

## 2021-09-12 ENCOUNTER — Other Ambulatory Visit: Payer: Self-pay

## 2021-09-12 ENCOUNTER — Ambulatory Visit (INDEPENDENT_AMBULATORY_CARE_PROVIDER_SITE_OTHER): Payer: Medicare HMO | Admitting: Ophthalmology

## 2021-09-12 ENCOUNTER — Encounter (INDEPENDENT_AMBULATORY_CARE_PROVIDER_SITE_OTHER): Payer: Self-pay | Admitting: Ophthalmology

## 2021-09-12 DIAGNOSIS — H2511 Age-related nuclear cataract, right eye: Secondary | ICD-10-CM | POA: Insufficient documentation

## 2021-09-12 DIAGNOSIS — H43822 Vitreomacular adhesion, left eye: Secondary | ICD-10-CM

## 2021-09-12 DIAGNOSIS — H2513 Age-related nuclear cataract, bilateral: Secondary | ICD-10-CM | POA: Diagnosis not present

## 2021-09-12 DIAGNOSIS — H353221 Exudative age-related macular degeneration, left eye, with active choroidal neovascularization: Secondary | ICD-10-CM | POA: Diagnosis not present

## 2021-09-12 MED ORDER — AFLIBERCEPT 2MG/0.05ML IZ SOLN FOR KALEIDOSCOPE
2.0000 mg | INTRAVITREAL | Status: AC | PRN
  Administered 2021-09-12: 2 mg via INTRAVITREAL

## 2021-09-12 MED ORDER — BEVACIZUMAB 2.5 MG/0.1ML IZ SOSY: 2.5000 mg | PREFILLED_SYRINGE | INTRAVITREAL | Status: DC | PRN

## 2021-09-12 NOTE — Progress Notes (Signed)
09/12/2021     CHIEF COMPLAINT Patient presents for  Chief Complaint  Patient presents with   Macular Degeneration      HISTORY OF PRESENT ILLNESS: Melissa Elliott is a 81 y.o. female who presents to the clinic today for:   HPI   5 weeks dilate OS, Eylea OCT. Patient states vision is stable and unchanged since last visit. Denies any new floaters or FOL.  Last edited by Laurin Coder on 09/12/2021  1:17 PM.      Referring physician: Clent Jacks, MD [address] [place],  [place] [address]  HISTORICAL INFORMATION:   Selected notes from the MEDICAL RECORD NUMBER       CURRENT MEDICATIONS: No current outpatient medications on file. (Ophthalmic Drugs)   No current facility-administered medications for this visit. (Ophthalmic Drugs)   Current Outpatient Medications (Other)  Medication Sig   ADVAIR DISKUS 250-50 MCG/DOSE AEPB Inhale 1 puff into the lungs 2 (two) times daily. Only uses in winter and spring months   albuterol (PROVENTIL HFA;VENTOLIN HFA) 108 (90 Base) MCG/ACT inhaler Inhale 2 puffs into the lungs every 6 (six) hours as needed for wheezing.   aspirin 81 MG EC tablet Take 81 mg by mouth daily. Swallow whole.   Calcium Carbonate-Vitamin D (CALCIUM-D) 600-400 MG-UNIT TABS Take 1 tablet by mouth daily in the afternoon.    cholecalciferol (VITAMIN D) 1000 units tablet Take 1,000 Units by mouth daily with lunch.   clopidogrel (PLAVIX) 75 MG tablet TAKE 1 TABLET BY MOUTH EVERY DAY WITH BREAKFAST   fexofenadine (ALLEGRA) 180 MG tablet Take 180 mg by mouth daily.   irbesartan (AVAPRO) 150 MG tablet TAKE 1 TABLET(150 MG) BY MOUTH DAILY   metFORMIN (GLUCOPHAGE) 500 MG tablet Take 500 mg by mouth daily. with food   metoprolol succinate (TOPROL-XL) 50 MG 24 hr tablet Take 50 mg by mouth daily.    montelukast (SINGULAIR) 10 MG tablet Take 10 mg by mouth at bedtime.   Multiple Vitamins-Minerals (PRESERVISION AREDS 2) CAPS Take 1 capsule by mouth 2 (two) times  daily.   nitroGLYCERIN (NITROSTAT) 0.4 MG SL tablet Place 0.4 mg under the tongue every 5 (five) minutes as needed for chest pain (UP TO 3 DOSES BEFORE CALLING EMS).   simvastatin (ZOCOR) 40 MG tablet Take 40 mg by mouth at bedtime.    No current facility-administered medications for this visit. (Other)      REVIEW OF SYSTEMS: ROS   Negative for: Constitutional, Gastrointestinal, Neurological, Skin, Genitourinary, Musculoskeletal, HENT, Endocrine, Cardiovascular, Eyes, Respiratory, Psychiatric, Allergic/Imm, Heme/Lymph Last edited by Hurman Horn, MD on 09/12/2021  1:58 PM.       ALLERGIES Allergies  Allergen Reactions   Ciprofloxacin Nausea And Vomiting    Severe "dry heaves"   Sulfa Antibiotics Swelling    Swelling in face, neck, arms, hands.    Iodine Hives   Eggs Or Egg-Derived Products Nausea And Vomiting    "sometimes I can eat eggs; sometimes I can't"   Penicillins     As a child Has patient had a PCN reaction causing immediate rash, facial/tongue/throat swelling, SOB or lightheadedness with hypotension: Unknown Has patient had a PCN reaction causing severe rash involving mucus membranes or skin necrosis: Unknown Has patient had a PCN reaction that required hospitalization: Unknown Has patient had a PCN reaction occurring within the last 10 years: Unknown If all of the above answers are "NO", then may proceed with Cephalosporin use.  PAST MEDICAL HISTORY Past Medical History:  Diagnosis Date   Asthma    Complication of anesthesia    "takes a long time to wakeup; sometimes days" (05/11/2018)   Coronary artery disease    H/O hiatal hernia    Heart murmur    High cholesterol    "on RX because of the bypass" (02/01/2013)   History of duodenal ulcer    Hot flashes    "on a regular basis" (05/11/2018)   Hypertension    Hypoglycemia    "that's something I have to watch constantly" (02/01/2013)   Migraine    "probably stopped w/menses" (05/11/2018)   Pneumonia  ? 1970's   "once" (05/11/2018)   Sciatic nerve pain 12/2012   "RLE; just had it once" (05/11/2018)   Past Surgical History:  Procedure Laterality Date   ABDOMINAL AORTOGRAM W/LOWER EXTREMITY N/A 02/08/2020   Procedure: ABDOMINAL AORTOGRAM W/LOWER EXTREMITY;  Surgeon: Wellington Hampshire, MD;  Location: [facility];  Service: Cardiovascular;  Laterality: N/A;   APPENDECTOMY  1970   BREAST BIOPSY Left    benign   COLONOSCOPY WITH PROPOFOL N/A 06/19/2015   Procedure: COLONOSCOPY WITH PROPOFOL;  Surgeon: Garlan Fair, MD;  Location: [facility];  Service: Endoscopy;  Laterality: N/A;   CORONARY ANGIOPLASTY WITH STENT PLACEMENT  05/11/2018   CORONARY ANGIOPLASTY WITH STENT PLACEMENT  1997   CORONARY ARTERY BYPASS GRAFT  ~ 1997   CABG X2   CORONARY STENT INTERVENTION N/A 05/11/2018   Procedure: CORONARY STENT INTERVENTION;  Surgeon: Jettie Booze, MD;  Location: [facility];  Service: Cardiovascular;  Laterality: N/A;   ESOPHAGOGASTRODUODENOSCOPY N/A 02/02/2013   Procedure: ESOPHAGOGASTRODUODENOSCOPY (EGD);  Surgeon: Wonda Horner, MD;  Location: [facility];  Service: Endoscopy;  Laterality: N/A;   LEFT HEART CATH AND CORS/GRAFTS ANGIOGRAPHY N/A 05/11/2018   Procedure: LEFT HEART CATH AND CORS/GRAFTS ANGIOGRAPHY;  Surgeon: Jettie Booze, MD;  Location: [facility];  Service: Cardiovascular;  Laterality: N/A;   LEFT HEART CATH AND CORS/GRAFTS ANGIOGRAPHY N/A 05/14/2018   Procedure: LEFT HEART CATH AND CORS/GRAFTS ANGIOGRAPHY;  Surgeon: Jettie Booze, MD;  Location: [facility];  Service: Cardiovascular;  Laterality: N/A;   PERIPHERAL VASCULAR ATHERECTOMY Left 02/08/2020   Procedure: PERIPHERAL VASCULAR ATHERECTOMY;  Surgeon: Wellington Hampshire, MD;  Location: [facility];  Service: Cardiovascular;  Laterality: Left;  sfa (distal)   PERIPHERAL VASCULAR BALLOON ANGIOPLASTY Left 02/08/2020   Procedure: PERIPHERAL VASCULAR BALLOON ANGIOPLASTY;   Surgeon: Wellington Hampshire, MD;  Location: [facility];  Service: Cardiovascular;  Laterality: Left;  sfa (distal)   SKIN LESION EXCISION Left 1960's   "upper arm; sore that never healed" (02/01/2013)   TONSILLECTOMY  1940's    FAMILY HISTORY Family History  Problem Relation Age of Onset   CAD Mother    Hypertension Mother    Asthma Mother    Heart attack Father    Hypertension Father    Hypertension Maternal Grandmother    Hypertension Maternal Grandfather    Breast cancer Paternal Aunt    Stroke Neg Hx     SOCIAL HISTORY Social History   Tobacco Use   Smoking status: Never   Smokeless tobacco: Never   Tobacco comments:    Exposure through parents.  Vaping Use   Vaping Use: Never used  Substance Use Topics   Alcohol use: Yes    Alcohol/week: 0.0 standard drinks    Comment: 05/11/2018  "glass of wine  maybe once/month"   Drug use: Never         OPHTHALMIC EXAM:  Base Eye Exam     Visual Acuity (ETDRS)       Right Left   Dist cc 20/25 -1 20/40 +2   Dist ph cc  20/30 -1    Correction: Glasses         Tonometry (Tonopen, 1:21 PM)       Right Left   Pressure 17 15         Pupils       Pupils Dark Light APD   Right PERRL 4 3 None   Left PERRL 4 3 None         Visual Fields       Left Right    Full Full         Extraocular Movement       Right Left    Full, Ortho Full, Ortho         Neuro/Psych     Oriented x3: Yes   Mood/Affect: Normal         Dilation     Left eye: 1.0% Mydriacyl, 2.5% Phenylephrine @ 1:21 PM           Slit Lamp and Fundus Exam     External Exam       Right Left   External Normal Normal         Slit Lamp Exam       Right Left   Lids/Lashes Normal Normal   Conjunctiva/Sclera White and quiet White and quiet   Cornea Clear Clear   Anterior Chamber Deep and quiet Deep and quiet   Iris Round and reactive Round and reactive   Lens 2+ Nuclear sclerosis 2+ Nuclear sclerosis   Anterior  Vitreous Normal Normal         Fundus Exam       Right Left   Posterior Vitreous  Normal   Disc  Normal   C/D Ratio  0.55   Macula  Macular thickening, no exudates, no hemorrhage, Choroidal neovascular membrane with PED, Retinal pigment epithelial detachment   Vessels  Normal   Periphery  Normal            IMAGING AND PROCEDURES  Imaging and Procedures for 09/12/21  OCT, Retina - OU - Both Eyes       Right Eye Quality was good. Scan locations included subfoveal. Central Foveal Thickness: 246. Progression has been stable. Findings include normal foveal contour, retinal drusen .   Left Eye Quality was good. Scan locations included subfoveal. Central Foveal Thickness: 409. Progression has improved. Findings include abnormal foveal contour, intraretinal fluid, subretinal fluid, pigment epithelial detachment, retinal drusen , vitreous traction.   Notes Recently discovered vascularized pigment epithelial detachment, wet AMD, with subretinal fluid and significant foveal elevation, OS,  slightly improved thickening  OD no sign of CNVM     Intravitreal Injection, Pharmacologic Agent - OS - Left Eye       Time Out 09/12/2021. 2:02 PM. Confirmed correct patient, procedure, site, and patient consented.   Anesthesia Subconjunctival anesthesia was used. Anesthetic medications included Lidocaine 4%.   Procedure Preparation included 10% betadine to eyelids, 5% betadine to ocular surface, Tobramycin 0.3%. A 30 gauge needle was used.   Injection: 2 mg aflibercept 2 MG/0.05ML   Route: Intravitreal, Site: Left Eye   NDC: [id number], Lot: [id number], Waste: 0 mL   Post-op Post injection exam found visual acuity of at  least counting fingers. The patient tolerated the procedure well. There were no complications. The patient received written and verbal post procedure care education. Post injection medications were not given.              ASSESSMENT/PLAN:  Nuclear  sclerotic cataract of both eyes I agree proceed with cataract surgery in each eye  Vitreomacular adhesion of left eye Partial PVD with focal adhesion to the fovea continues  Exudative age-related macular degeneration of left eye with active choroidal neovascularization ([place]) Wet AMD with associated subretinal fluid and vascularized PED slightly improved again today on Eylea.  Repeat injection Eylea today and maintain 5-week follow-up     ICD-10-CM   1. Exudative age-related macular degeneration of left eye with active choroidal neovascularization (HCC)  H35.3221 OCT, Retina - OU - Both Eyes    Intravitreal Injection, Pharmacologic Agent - OS - Left Eye    aflibercept (EYLEA) SOLN 2 mg    DISCONTINUED: bevacizumab (AVASTIN) SOSY 2.5 mg    2. Nuclear sclerotic cataract of both eyes  H25.13     3. Vitreomacular adhesion of left eye  H43.822       1.  OS, vascularized subfoveal PED with subretinal fluid continues to improve slightly.  Repeat Eylea today and examination next again in 5 weeks  2.  Plan Extraction intraocular displacement of each eye, okay to proceed in either eye sequentially, and I explained the patient that hopefully the VMA of the left eye will release with PVD formation after cataract surgery.  3.  Ophthalmic Meds Ordered this visit:  Meds ordered this encounter  Medications   DISCONTD: bevacizumab (AVASTIN) SOSY 2.5 mg   aflibercept (EYLEA) SOLN 2 mg       Return in about 5 weeks (around 10/17/2021) for OS, EYLEA OCT.  There are no Patient Instructions on file for this visit.   Explained the diagnoses, plan, and follow up with the patient and they expressed understanding.  Patient expressed understanding of the importance of proper follow up care.   Clent Demark Samaad Hashem M.D. Diseases & Surgery of the Retina and Vitreous [facility] [place] 09/12/21     Abbreviations: M myopia (nearsighted); A astigmatism; H hyperopia (farsighted); P presbyopia; Mrx  spectacle prescription;  CTL contact lenses; OD right eye; OS left eye; OU both eyes  XT exotropia; ET esotropia; PEK punctate epithelial keratitis; PEE punctate epithelial erosions; DES dry eye syndrome; MGD meibomian gland dysfunction; ATs artificial tears; PFAT's preservative free artificial tears; [place] nuclear sclerotic cataract; PSC posterior subcapsular cataract; ERM epi-retinal membrane; PVD posterior vitreous detachment; RD retinal detachment; DM diabetes mellitus; DR diabetic retinopathy; NPDR non-proliferative diabetic retinopathy; PDR proliferative diabetic retinopathy; CSME clinically significant macular edema; DME diabetic macular edema; dbh dot blot hemorrhages; CWS cotton wool spot; POAG primary open angle glaucoma; C/D cup-to-disc ratio; HVF humphrey visual field; GVF goldmann visual field; OCT optical coherence tomography; IOP intraocular pressure; BRVO Branch retinal vein occlusion; CRVO central retinal vein occlusion; CRAO central retinal artery occlusion; BRAO branch retinal artery occlusion; RT retinal tear; SB scleral buckle; PPV pars plana vitrectomy; VH Vitreous hemorrhage; PRP panretinal laser photocoagulation; IVK intravitreal kenalog; VMT vitreomacular traction; MH Macular hole;  NVD neovascularization of the disc; NVE neovascularization elsewhere; AREDS age related eye disease study; ARMD age related macular degeneration; POAG primary open angle glaucoma; EBMD epithelial/anterior basement membrane dystrophy; ACIOL anterior chamber intraocular lens; IOL intraocular lens; PCIOL posterior chamber intraocular lens; Phaco/IOL phacoemulsification with intraocular lens placement; PRK photorefractive keratectomy; LASIK  laser assisted in situ keratomileusis; HTN hypertension; DM diabetes mellitus; COPD chronic obstructive pulmonary disease

## 2021-09-12 NOTE — Assessment & Plan Note (Signed)
Wet AMD with associated subretinal fluid and vascularized PED slightly improved again today on Eylea.  Repeat injection Eylea today and maintain 5-week follow-up ?

## 2021-09-12 NOTE — Assessment & Plan Note (Signed)
I agree proceed with cataract surgery in each eye ?

## 2021-09-12 NOTE — Assessment & Plan Note (Signed)
Partial PVD with focal adhesion to the fovea continues ?

## 2021-09-24 DIAGNOSIS — R059 Cough, unspecified: Secondary | ICD-10-CM | POA: Diagnosis not present

## 2021-09-24 DIAGNOSIS — J4530 Mild persistent asthma, uncomplicated: Secondary | ICD-10-CM | POA: Diagnosis not present

## 2021-10-04 ENCOUNTER — Encounter (INDEPENDENT_AMBULATORY_CARE_PROVIDER_SITE_OTHER): Payer: Self-pay

## 2021-10-04 DIAGNOSIS — H2512 Age-related nuclear cataract, left eye: Secondary | ICD-10-CM | POA: Diagnosis not present

## 2021-10-04 DIAGNOSIS — H353111 Nonexudative age-related macular degeneration, right eye, early dry stage: Secondary | ICD-10-CM | POA: Diagnosis not present

## 2021-10-04 DIAGNOSIS — H353221 Exudative age-related macular degeneration, left eye, with active choroidal neovascularization: Secondary | ICD-10-CM | POA: Diagnosis not present

## 2021-10-10 ENCOUNTER — Ambulatory Visit (HOSPITAL_COMMUNITY)
Admission: RE | Admit: 2021-10-10 | Discharge: 2021-10-10 | Disposition: A | Discharge status: Home / Self Care | Payer: Medicare HMO | Source: Ambulatory Visit | Attending: Cardiology | Admitting: Cardiology

## 2021-10-10 DIAGNOSIS — I739 Peripheral vascular disease, unspecified: Secondary | ICD-10-CM | POA: Insufficient documentation

## 2021-10-15 ENCOUNTER — Ambulatory Visit: Payer: Medicare HMO | Admitting: Cardiovascular Disease

## 2021-10-15 ENCOUNTER — Encounter: Payer: Self-pay | Admitting: Cardiovascular Disease

## 2021-10-15 VITALS — BP 142/70 | HR 100 | Ht 64.0 in | Wt 123.0 lb

## 2021-10-15 DIAGNOSIS — I739 Peripheral vascular disease, unspecified: Secondary | ICD-10-CM | POA: Diagnosis not present

## 2021-10-15 DIAGNOSIS — E785 Hyperlipidemia, unspecified: Secondary | ICD-10-CM | POA: Diagnosis not present

## 2021-10-15 DIAGNOSIS — I2510 Atherosclerotic heart disease of native coronary artery without angina pectoris: Secondary | ICD-10-CM

## 2021-10-15 DIAGNOSIS — I10 Essential (primary) hypertension: Secondary | ICD-10-CM

## 2021-10-15 NOTE — Patient Instructions (Addendum)
Medication Instructions:  ?No changes ?*If you need a refill on your cardiac medications before your next appointment, please call your pharmacy* ? ? ?Lab Work: ?None ordered ?If you have labs (blood work) drawn today and your tests are completely normal, you will receive your results only by: ?MyChart Message (if you have MyChart) OR ?A paper copy in the mail ?If you have any lab test that is abnormal or we need to change your treatment, we will call you to review the results. ? ? ?Testing/Procedures: ?None ordered ? ? ?Follow-Up: ?At [facility], you and your health needs are our priority.  As part of our continuing mission to provide you with exceptional heart care, we have created designated Provider Care Teams.  These Care Teams include your primary Cardiologist (physician) and Advanced Practice Providers (APPs -  Physician Assistants and Nurse Practitioners) who all work together to provide you with the care you need, when you need it. ? ?We recommend signing up for the patient portal called "MyChart".  Sign up information is provided on this After Visit Summary.  MyChart is used to connect with patients for Virtual Visits (Telemedicine).  Patients are able to view lab/test results, encounter notes, upcoming appointments, etc.  Non-urgent messages can be sent to your provider as well.   ?To learn more about what you can do with MyChart, go to [email or web address].   ? ?Your next appointment:   ?3 month(s) before your [place] trip ? ?The format for your next appointment:   ?In Person ? ?Provider:   ?Dr. Fletcher Anon ? ?EXERCISE PROGRAM FOR INDIVIDUALS WITH  ?PERIPHERAL ARTERIAL DISEASE (PAD)  ? ?General Information:  ? ?Research in vascular exercise has demonstrated remarkable improvement in symptoms of leg pain (claudication) without expensive or invasive interventions. Regular walking programs are extremely helpful for patients with PAD and intermittent claudication.  ?These steps are designed to help you get  started with a safe and effective program to help you walk farther with less pain:  ? Walk at least three times a week (preferably every day). ? Your goal is to build up to 30-45 minutes of total walking time (not counting rest breaks). It may take you several weeks to build up your exercise time starting at 5-10 minutes or whatever you can tolerate. ? Walk as far as possible using moderate to maximal pain (7-8 on the scale below) as a signal to stop, and resume walking when the pain goes away. ? On a treadmill, set the speed and grade at a level that brings on the claudication pain within 3 to 5 minutes. Walk at this rate until you experience claudication of moderate severity, rest until the pain improves, and then resume walking. ? Over time, you will be able to walk longer at the designated speed and grade; workload should then be increased until you develop the pain within 3 to 5 minutes once again. ? This regimen will induce a significant benefit. Studies have demonstrated that participants may be able to walk up to three or four times farther and have less leg pain, within twelve weeks, by following this protocol. ? ?Pain Scale  ? ? 0_____1_____2_____3_____4_____5_____6_____7_____8_____9_____10  ? No Pain                                   Moderate Pain  Maximal Pain ? ? ?

## 2021-10-15 NOTE — Progress Notes (Signed)
?  ?Cardiology Office Note ? ? ?Date:  10/17/2021  ? ?ID:  Melissa Elliott, DOB 1940-08-24, MRN [id number] ? ?PCP:  Wenda Low, MD  ?Cardiologist: Dr. Irish Lack ? ?Chief Complaint  ?Patient presents with  ? Follow-up  ? ? ?  ?History of Present Illness: ?Melissa Elliott is a 81 y.o. female who is here today for follow-up visit regarding peripheral arterial disease.   She has known history of coronary artery disease status post CABG in 1996 with subsequent stenting.  She has chronic medical conditions that include hyperlipidemia, stomach ulcers, prediabetes, essential hypertension and migraines. ?She is a very active person and exercises on a regular basis.  She was seen in 2021 for progressive left calf claudication which significantly affected her ability to exercise. She underwent vascular studies in March which showed an ABI of 0.94 on the right and 0.79 on the left.  Duplex showed severe distal left SFA stenosis with peak velocity of 461.  ?Symptoms did not improve with the walking program and thus I proceeded with angiography in July, 2021 which showed no significant aortoiliac disease, there was severe stenosis in the distal left SFA with two-vessel runoff below the knee.  I performed successful orbital atherectomy and drug-coated balloon angioplasty to the distal left SFA. ? ?During last visit, she complained of severe right-sided back pain radiating to the right leg that was felt to be neuropathic and not claudication. ? ?She underwent recent lower extremity arterial Doppler studies which showed a drop in ABI on the right side to 0.85 and on the left at 0.80.  Duplex on the right showed moderate SFA disease and on the left showed significant mid to distal SFA stenosis. ?She reports bilateral calf pain if she walks at a speed of 3 mph on the treadmill.  Symptoms are overall mild.  No rest pain or lower extremity ulceration. ? ? ? ?Past Medical History:  ?Diagnosis Date  ? Asthma   ? Complication of  anesthesia   ? "takes a long time to wakeup; sometimes days" (05/11/2018)  ? Coronary artery disease   ? H/O hiatal hernia   ? Heart murmur   ? High cholesterol   ? "on RX because of the bypass" (02/01/2013)  ? History of duodenal ulcer   ? Hot flashes   ? "on a regular basis" (05/11/2018)  ? Hypertension   ? Hypoglycemia   ? "that's something I have to watch constantly" (02/01/2013)  ? Migraine   ? "probably stopped w/menses" (05/11/2018)  ? Pneumonia ? 1970's  ? "once" (05/11/2018)  ? Sciatic nerve pain 12/2012  ? "RLE; just had it once" (05/11/2018)  ? ? ?Past Surgical History:  ?Procedure Laterality Date  ? ABDOMINAL AORTOGRAM W/LOWER EXTREMITY N/A 02/08/2020  ? Procedure: ABDOMINAL AORTOGRAM W/LOWER EXTREMITY;  Surgeon: Wellington Hampshire, MD;  Location: [place] [facility];  Service: Cardiovascular;  Laterality: N/A;  ? APPENDECTOMY  1970  ? BREAST BIOPSY Left   ? benign  ? COLONOSCOPY WITH PROPOFOL N/A 06/19/2015  ? Procedure: COLONOSCOPY WITH PROPOFOL;  Surgeon: Garlan Fair, MD;  Location: [facility];  Service: Endoscopy;  Laterality: N/A;  ? CORONARY ANGIOPLASTY WITH STENT PLACEMENT  05/11/2018  ? CORONARY ANGIOPLASTY WITH STENT PLACEMENT  1997  ? CORONARY ARTERY BYPASS GRAFT  ~ 1997  ? CABG X2  ? CORONARY STENT INTERVENTION N/A 05/11/2018  ? Procedure: CORONARY STENT INTERVENTION;  Surgeon: Jettie Booze, MD;  Location: [place] [facility];  Service: Cardiovascular;  Laterality: N/A;  ? ESOPHAGOGASTRODUODENOSCOPY N/A 02/02/2013  ? Procedure: ESOPHAGOGASTRODUODENOSCOPY (EGD);  Surgeon: Wonda Horner, MD;  [facility];  Service: Endoscopy;  Laterality: N/A;  ? LEFT HEART CATH AND CORS/GRAFTS ANGIOGRAPHY N/A 05/11/2018  ? Procedure: LEFT HEART CATH AND CORS/GRAFTS ANGIOGRAPHY;  Surgeon: Jettie Booze, MD;  Location: [place] [facility];  Service: Cardiovascular;  Laterality: N/A;  ? LEFT HEART CATH AND CORS/GRAFTS ANGIOGRAPHY N/A 05/14/2018  ? Procedure: LEFT HEART CATH AND CORS/GRAFTS  ANGIOGRAPHY;  Surgeon: Jettie Booze, MD;  Location: [place] [facility];  Service: Cardiovascular;  Laterality: N/A;  ? PERIPHERAL VASCULAR ATHERECTOMY Left 02/08/2020  ? Procedure: PERIPHERAL VASCULAR ATHERECTOMY;  Surgeon: Wellington Hampshire, MD;  Location: [place] [facility];  Service: Cardiovascular;  Laterality: Left;  sfa (distal)  ? PERIPHERAL VASCULAR BALLOON ANGIOPLASTY Left 02/08/2020  ? Procedure: PERIPHERAL VASCULAR BALLOON ANGIOPLASTY;  Surgeon: Wellington Hampshire, MD;  Location: [place] [facility];  Service: Cardiovascular;  Laterality: Left;  sfa (distal)  ? SKIN LESION EXCISION Left 1960's  ? "upper arm; sore that never healed" (02/01/2013)  ? TONSILLECTOMY  1940's  ? ? ? ?Current Outpatient Medications  ?Medication Sig Dispense Refill  ? ADVAIR DISKUS 250-50 MCG/DOSE AEPB Inhale 1 puff into the lungs 2 (two) times daily. Only uses in winter and spring months  11  ? albuterol (PROVENTIL HFA;VENTOLIN HFA) 108 (90 Base) MCG/ACT inhaler Inhale 2 puffs into the lungs every 6 (six) hours as needed for wheezing. 1 Inhaler 3  ? aspirin 81 MG EC tablet Take 81 mg by mouth daily. Swallow whole.    ? Calcium Carbonate-Vitamin D (CALCIUM-D) 600-400 MG-UNIT TABS Take 1 tablet by mouth daily in the afternoon.     ? cholecalciferol (VITAMIN D) 1000 units tablet Take 1,000 Units by mouth daily with lunch.    ? clopidogrel (PLAVIX) 75 MG tablet TAKE 1 TABLET BY MOUTH EVERY DAY WITH BREAKFAST 90 tablet 3  ? fexofenadine (ALLEGRA) 180 MG tablet Take 180 mg by mouth daily.    ? irbesartan (AVAPRO) 150 MG tablet TAKE 1 TABLET(150 MG) BY MOUTH DAILY 90 tablet 2  ? metFORMIN (GLUCOPHAGE) 500 MG tablet Take 500 mg by mouth daily. with food  1  ? metoprolol succinate (TOPROL-XL) 50 MG 24 hr tablet Take 50 mg by mouth daily.     ? montelukast (SINGULAIR) 10 MG tablet Take 10 mg by mouth at bedtime.    ? Multiple Vitamins-Minerals (PRESERVISION AREDS 2) CAPS Take 1 capsule by mouth 2 (two) times daily.    ? nitroGLYCERIN  (NITROSTAT) 0.4 MG SL tablet Place 0.4 mg under the tongue every 5 (five) minutes as needed for chest pain (UP TO 3 DOSES BEFORE CALLING EMS).    ? simvastatin (ZOCOR) 40 MG tablet Take 40 mg by mouth at bedtime.     ? ?No current facility-administered medications for this visit.  ? ? ?Allergies:   Ciprofloxacin, Sulfa antibiotics, Iodine, Eggs or egg-derived products, and Penicillins  ? ? ?Social History:  The patient  reports that she has never smoked. She has never used smokeless tobacco. She reports current alcohol use. She reports that she does not use drugs.  ? ?Family History:  The patient's family history includes Asthma in her mother; Breast cancer in her paternal aunt; CAD in her mother; Heart attack in her father; Hypertension in her father, maternal grandfather, maternal grandmother, and mother.  ? ? ?ROS:  Please see the history of present illness.  Otherwise, review of systems are positive for none.   All other systems are reviewed and negative.  ? ? ?PHYSICAL EXAM: ?VS:  BP (!) 142/70 (BP Location: Left Arm, Patient Position: Sitting, Cuff Size: Normal)   Pulse 100   Ht '5\' 4"'$  (1.626 m)   Wt 123 lb (55.8 kg)   BMI 21.11 kg/m?  , BMI Body mass index is 21.11 kg/m?. ?GEN: Well nourished, well developed, in no acute distress  ?HEENT: normal  ?Neck: no JVD, carotid bruits, or masses ?Cardiac: RRR; no murmurs, rubs, or gallops,no edema  ?Respiratory:  clear to auscultation bilaterally, normal work of breathing ?GI: soft, nontender, nondistended, + BS ?MS: no deformity or atrophy  ?Skin: warm and dry, no rash ?Neuro:  Strength and sensation are intact ?Psych: euthymic mood, full affect ? ?EKG:  EKG ordered today. ?EKG showed normal sinus rhythm with no significant ST or T wave changes. ? ? ?Recent Labs: ?No results found for requested labs within last 8760 hours.  ? ? ?Lipid Panel ?   ?Component Value Date/Time  ? CHOL 157 05/14/2018 0203  ? TRIG 120 05/14/2018 0203  ? HDL 52 05/14/2018 0203  ? CHOLHDL  3.0 05/14/2018 0203  ? VLDL 24 05/14/2018 0203  ? [place] 81 05/14/2018 0203  ? ?  ? ?Wt Readings from Last 3 Encounters:  ?10/15/21 123 lb (55.8 kg)  ?10/16/20 122 lb 3.2 oz (55.4 kg)  ?10/01/20 124 lb (56.2 k

## 2021-10-17 ENCOUNTER — Encounter (INDEPENDENT_AMBULATORY_CARE_PROVIDER_SITE_OTHER): Payer: Self-pay | Admitting: Ophthalmology

## 2021-10-17 ENCOUNTER — Ambulatory Visit (INDEPENDENT_AMBULATORY_CARE_PROVIDER_SITE_OTHER): Payer: Medicare HMO | Admitting: Ophthalmology

## 2021-10-17 DIAGNOSIS — H2513 Age-related nuclear cataract, bilateral: Secondary | ICD-10-CM | POA: Diagnosis not present

## 2021-10-17 DIAGNOSIS — H43822 Vitreomacular adhesion, left eye: Secondary | ICD-10-CM | POA: Diagnosis not present

## 2021-10-17 DIAGNOSIS — H353221 Exudative age-related macular degeneration, left eye, with active choroidal neovascularization: Secondary | ICD-10-CM

## 2021-10-17 MED ORDER — AFLIBERCEPT 2MG/0.05ML IZ SOLN FOR KALEIDOSCOPE
2.0000 mg | INTRAVITREAL | Status: AC | PRN
  Administered 2021-10-17: 2 mg via INTRAVITREAL

## 2021-10-17 NOTE — Assessment & Plan Note (Signed)
Scheduled for cataract surgery May 2023 ?

## 2021-10-17 NOTE — Progress Notes (Signed)
? ? ?10/17/2021 ? ?  ? ?CHIEF COMPLAINT ?Patient presents for  ?Chief Complaint  ?Patient presents with  ? Macular Degeneration  ? ? ? ? ?HISTORY OF PRESENT ILLNESS: ?Melissa Elliott is a 81 y.o. female who presents to the clinic today for:  ? ?HPI   ?5 weeks OS Eylea OCT. ?Patient states vision is stable and unchanged since last visit. Denies any new floaters or FOL. ?PT ALLERGY TO CIPROFLOXACIN. ?Pt states Dr. Midge Aver is going to be doing her Cataract surgery, left eye May 11th, 2023 and right eye May 25th, 2023. ?Last edited by Laurin Coder on 10/17/2021  1:48 PM.  ?  ? ? ?Referring physician: ?Warden Fillers, MD ?[address] ?[address] ?[place],  [place] [id number] ? ?HISTORICAL INFORMATION:  ? ?Selected notes from the [place] [facility] ?  ?   ? ?CURRENT MEDICATIONS: ?No current outpatient medications on file. (Ophthalmic Drugs)  ? ?No current facility-administered medications for this visit. (Ophthalmic Drugs)  ? ?Current Outpatient Medications (Other)  ?Medication Sig  ? ADVAIR DISKUS 250-50 MCG/DOSE AEPB Inhale 1 puff into the lungs 2 (two) times daily. Only uses in winter and spring months  ? albuterol (PROVENTIL HFA;VENTOLIN HFA) 108 (90 Base) MCG/ACT inhaler Inhale 2 puffs into the lungs every 6 (six) hours as needed for wheezing.  ? aspirin 81 MG EC tablet Take 81 mg by mouth daily. Swallow whole.  ? Calcium Carbonate-Vitamin D (CALCIUM-D) 600-400 MG-UNIT TABS Take 1 tablet by mouth daily in the afternoon.   ? cholecalciferol (VITAMIN D) 1000 units tablet Take 1,000 Units by mouth daily with lunch.  ? clopidogrel (PLAVIX) 75 MG tablet TAKE 1 TABLET BY MOUTH EVERY DAY WITH BREAKFAST  ? fexofenadine (ALLEGRA) 180 MG tablet Take 180 mg by mouth daily.  ? irbesartan (AVAPRO) 150 MG tablet TAKE 1 TABLET(150 MG) BY MOUTH DAILY  ? metFORMIN (GLUCOPHAGE) 500 MG tablet Take 500 mg by mouth daily. with food  ? metoprolol succinate (TOPROL-XL) 50 MG 24 hr tablet Take 50 mg by mouth daily.   ? montelukast  (SINGULAIR) 10 MG tablet Take 10 mg by mouth at bedtime.  ? Multiple Vitamins-Minerals (PRESERVISION AREDS 2) CAPS Take 1 capsule by mouth 2 (two) times daily.  ? nitroGLYCERIN (NITROSTAT) 0.4 MG SL tablet Place 0.4 mg under the tongue every 5 (five) minutes as needed for chest pain (UP TO 3 DOSES BEFORE CALLING EMS).  ? simvastatin (ZOCOR) 40 MG tablet Take 40 mg by mouth at bedtime.   ? ?No current facility-administered medications for this visit. (Other)  ? ? ? ? ?REVIEW OF SYSTEMS: ?ROS   ?Negative for: Constitutional, Gastrointestinal, Neurological, Skin, Genitourinary, Musculoskeletal, HENT, Endocrine, Cardiovascular, Eyes, Respiratory, Psychiatric, Allergic/Imm, Heme/Lymph ?Last edited by Hurman Horn, MD on 10/17/2021  1:57 PM.  ?  ? ? ? ?ALLERGIES ?Allergies  ?Allergen Reactions  ? Ciprofloxacin Nausea And Vomiting  ?  Severe "dry heaves"  ? Sulfa Antibiotics Swelling  ?  Swelling in face, neck, arms, hands.   ? Iodine Hives  ? Eggs Or Egg-Derived Products Nausea And Vomiting  ?  "sometimes I can eat eggs; sometimes I can't"  ? Penicillins   ?  As a child ?Has patient had a PCN reaction causing immediate rash, facial/tongue/throat swelling, SOB or lightheadedness with hypotension: Unknown ?Has patient had a PCN reaction causing severe rash involving mucus membranes or skin necrosis: Unknown ?Has patient had a PCN reaction that required hospitalization: Unknown ?Has patient had a PCN  reaction occurring within the last 10 years: Unknown ?If all of the above answers are "NO", then may proceed with Cephalosporin use. ?  ? ? ?PAST MEDICAL HISTORY ?Past Medical History:  ?Diagnosis Date  ? Asthma   ? Complication of anesthesia   ? "takes a long time to wakeup; sometimes days" (05/11/2018)  ? Coronary artery disease   ? H/O hiatal hernia   ? Heart murmur   ? High cholesterol   ? "on RX because of the bypass" (02/01/2013)  ? History of duodenal ulcer   ? Hot flashes   ? "on a regular basis" (05/11/2018)  ?  Hypertension   ? Hypoglycemia   ? "that's something I have to watch constantly" (02/01/2013)  ? Migraine   ? "probably stopped w/menses" (05/11/2018)  ? Pneumonia ? 1970's  ? "once" (05/11/2018)  ? Sciatic nerve pain 12/2012  ? "RLE; just had it once" (05/11/2018)  ? ?Past Surgical History:  ?Procedure Laterality Date  ? ABDOMINAL AORTOGRAM W/LOWER EXTREMITY N/A 02/08/2020  ? Procedure: ABDOMINAL AORTOGRAM W/LOWER EXTREMITY;  Surgeon: Wellington Hampshire, MD;  Location: [place] [facility];  Service: Cardiovascular;  Laterality: N/A;  ? APPENDECTOMY  1970  ? BREAST BIOPSY Left   ? benign  ? COLONOSCOPY WITH PROPOFOL N/A 06/19/2015  ? Procedure: COLONOSCOPY WITH PROPOFOL;  Surgeon: Garlan Fair, MD;  Location: [facility];  Service: Endoscopy;  Laterality: N/A;  ? CORONARY ANGIOPLASTY WITH STENT PLACEMENT  05/11/2018  ? CORONARY ANGIOPLASTY WITH STENT PLACEMENT  1997  ? CORONARY ARTERY BYPASS GRAFT  ~ 1997  ? CABG X2  ? CORONARY STENT INTERVENTION N/A 05/11/2018  ? Procedure: CORONARY STENT INTERVENTION;  Surgeon: Jettie Booze, MD;  Location: [place] [facility];  Service: Cardiovascular;  Laterality: N/A;  ? ESOPHAGOGASTRODUODENOSCOPY N/A 02/02/2013  ? Procedure: ESOPHAGOGASTRODUODENOSCOPY (EGD);  Surgeon: Wonda Horner, MD;  [facility];  Service: Endoscopy;  Laterality: N/A;  ? LEFT HEART CATH AND CORS/GRAFTS ANGIOGRAPHY N/A 05/11/2018  ? Procedure: LEFT HEART CATH AND CORS/GRAFTS ANGIOGRAPHY;  Surgeon: Jettie Booze, MD;  Location: [place] [facility];  Service: Cardiovascular;  Laterality: N/A;  ? LEFT HEART CATH AND CORS/GRAFTS ANGIOGRAPHY N/A 05/14/2018  ? Procedure: LEFT HEART CATH AND CORS/GRAFTS ANGIOGRAPHY;  Surgeon: Jettie Booze, MD;  Location: [place] [facility];  Service: Cardiovascular;  Laterality: N/A;  ? PERIPHERAL VASCULAR ATHERECTOMY Left 02/08/2020  ? Procedure: PERIPHERAL VASCULAR ATHERECTOMY;  Surgeon: Wellington Hampshire, MD;  Location: [place] [facility];  Service:  Cardiovascular;  Laterality: Left;  sfa (distal)  ? PERIPHERAL VASCULAR BALLOON ANGIOPLASTY Left 02/08/2020  ? Procedure: PERIPHERAL VASCULAR BALLOON ANGIOPLASTY;  Surgeon: Wellington Hampshire, MD;  Location: [facility];  Service: Cardiovascular;  Laterality: Left;  sfa (distal)  ? SKIN LESION EXCISION Left 1960's  ? "upper arm; sore that never healed" (02/01/2013)  ? TONSILLECTOMY  1940's  ? ? ?FAMILY HISTORY ?Family History  ?Problem Relation Age of Onset  ? CAD Mother   ? Hypertension Mother   ? Asthma Mother   ? Heart attack Father   ? Hypertension Father   ? Hypertension Maternal Grandmother   ? Hypertension Maternal Grandfather   ? Breast cancer Paternal Aunt   ? Stroke Neg Hx   ? ? ?SOCIAL HISTORY ?Social History  ? ?Tobacco Use  ? Smoking status: Never  ? Smokeless tobacco: Never  ? Tobacco comments:  ?  Exposure through parents.  ?Vaping Use  ? Vaping Use: Never  used  ?Substance Use Topics  ? Alcohol use: Yes  ?  Alcohol/week: 0.0 standard drinks  ?  Comment: 05/11/2018  "glass of wine maybe once/month"  ? Drug use: Never  ? ?  ? ?  ? ?OPHTHALMIC EXAM: ? ?Base Eye Exam   ? ? Visual Acuity (ETDRS)   ? ?   Right Left  ? Dist cc 20/40 -2 20/40 -1  ? Dist ph cc NI 20/30  ? ? Correction: Glasses  ? ?  ?  ? ? Tonometry (Tonopen, 1:45 PM)   ? ?   Right Left  ? Pressure 12 12  ? ?  ?  ? ? Pupils   ? ?   Pupils Dark Light APD  ? Right PERRL 4 3 None  ? Left PERRL 4 3 None  ? ?  ?  ? ? Extraocular Movement   ? ?   Right Left  ?  Full Full  ? ?  ?  ? ? Neuro/Psych   ? ? Oriented x3: Yes  ? Mood/Affect: Normal  ? ?  ?  ? ? Dilation   ? ? Left eye: 1.0% Mydriacyl, 2.5% Phenylephrine @ 1:45 PM  ? ?  ?  ? ?  ? ?Slit Lamp and Fundus Exam   ? ? External Exam   ? ?   Right Left  ? External Normal Normal  ? ?  ?  ? ? Slit Lamp Exam   ? ?   Right Left  ? Lids/Lashes Normal Normal  ? Conjunctiva/Sclera White and quiet White and quiet  ? Cornea Clear Clear  ? Anterior Chamber Deep and quiet Deep and quiet  ? Iris Round and  reactive Round and reactive  ? Lens 2.5+ Nuclear sclerosis 2.5+ Nuclear sclerosis  ? Anterior Vitreous Normal Normal  ? ?  ?  ? ? Fundus Exam   ? ?   Right Left  ? Posterior Vitreous  Normal  ? Disc  Normal  ? C/D

## 2021-10-17 NOTE — Assessment & Plan Note (Signed)
Much improved as compared to onset of disease with improved anatomy by clinical findings as well as OCT. ? ?Currently a 5-week interval follow-up.  We will maintain 5-week follow-up interval to retard recurrence likelihood. ?

## 2021-10-17 NOTE — Assessment & Plan Note (Signed)
Apparently physiologic OS ?

## 2021-10-21 DIAGNOSIS — J4531 Mild persistent asthma with (acute) exacerbation: Secondary | ICD-10-CM | POA: Diagnosis not present

## 2021-10-21 DIAGNOSIS — R052 Subacute cough: Secondary | ICD-10-CM | POA: Diagnosis not present

## 2021-10-24 ENCOUNTER — Other Ambulatory Visit: Payer: Self-pay | Admitting: Interventional Cardiology

## 2021-10-24 ENCOUNTER — Telehealth: Payer: Self-pay | Admitting: Interventional Cardiology

## 2021-10-24 DIAGNOSIS — R0602 Shortness of breath: Secondary | ICD-10-CM

## 2021-10-24 NOTE — Telephone Encounter (Signed)
Pt c/o Shortness Of Breath: STAT if SOB developed within the last 24 hours or pt is noticeably SOB on the phone ? ?1. Are you currently SOB (can you hear that pt is SOB on the phone)?  ?No  ? ?2. How long have you been experiencing SOB?  ?3-4 weeks ? ?3. Are you SOB when sitting or when up moving around?  ?When up and moving around ? ?4. Are you currently experiencing any other symptoms?  ?No  ? ?

## 2021-10-24 NOTE — Telephone Encounter (Signed)
Spoke with patient regarding SOB. ? ?Patient reports for the past month she has had an exacerbation of her asthma with a cough and SOB. She reports SOB is with exertion only. She states she is no longer able to go up and down her stairs at home or take care of her laundry without getting winded. ? ?Patient denies CP, edema, weight gain, or increased intake of salty foods. ? ?She states her PCP Dr. Lysle Rubens prescribed her Prednisone for her asthma symptoms, she is currently on her second round of Prednisone '40mg'$  QD x5 days (on day 3 of 5 today). ? ?Patient also reports she uses Advair 250-64mg 1 puff BID, Allegra '180mg'$  QD, and Singular '10mg'$  QD. ? ?Patient reports she has been trying to increase her physical activity to help with her PAD that is being treated by Dr. AFletcher Anon but she states she is not able to do much with the SOB. ? ?Symptoms sound more like asthma than cardiac cause, will forward to Dr. VIrish Lackto review and advise. ?

## 2021-10-27 NOTE — Telephone Encounter (Signed)
OK to check echo to eval for cardiac cause of shortness of breath. Did not see recent echo on file.  ?

## 2021-10-28 NOTE — Telephone Encounter (Signed)
Patient notified. She would like to have echo done.  Order placed.  ?

## 2021-10-31 ENCOUNTER — Telehealth: Payer: Self-pay | Admitting: Interventional Cardiology

## 2021-10-31 NOTE — Telephone Encounter (Signed)
Called pt back in regards to elevated BP.  Pt reports BP 4/19 158/87-87  4/20 188/93-90.  BP reading today was after walking for 30 minutes at the gym.  Pt reports has completed 2 rounds of prednisone 1st in March 2nd in April.   ? ?Pt denies HA and chest discomfort.  Denies increased sodium in diet.  Did report ate 1/2 [employer] hamburger and broccoli for dinner yesterday.  Has not missed any doses of irbesartan 150 mg PO QD or metoprolol succinate 50 mg PO QD.  Denies pain and extra stress.  Only has issues with asthma. ? ?Advised pt that prednisone can increase BP.  Also, advised pt to check BP daily for the next few days and call in results on Monday or Tuesday.  Advised to check BP 1.5-2 hours after taking medications and to sit for 5 minutes before checking BP. Pt verbalizes understanding.   ?

## 2021-10-31 NOTE — Telephone Encounter (Signed)
Pt c/o BP issue: STAT if pt c/o blurred vision, one-sided weakness or slurred speech ? ?1. What are your last 5 BP readings? 188/93; 90; 158/87; 87  ? ?2. Are you having any other symptoms (ex. Dizziness, headache, blurred vision, passed out)? No  ? ?3. What is your BP issue? Patient is experiencing High BP   ?

## 2021-10-31 NOTE — Telephone Encounter (Signed)
Addendum: Advised pt if becomes symptomatic of elevated BP to call our office prior to [facility] or Tues.   ?

## 2021-11-05 ENCOUNTER — Ambulatory Visit (HOSPITAL_COMMUNITY): Payer: Medicare HMO | Attending: Cardiology

## 2021-11-05 DIAGNOSIS — R0602 Shortness of breath: Secondary | ICD-10-CM | POA: Insufficient documentation

## 2021-11-05 LAB — ECHOCARDIOGRAM COMPLETE
Area-P 1/2: 3.99 cm2
S' Lateral: 2.4 cm

## 2021-11-05 NOTE — Progress Notes (Signed)
Normal LV function and valvular function

## 2021-11-05 NOTE — Telephone Encounter (Signed)
OK to take an extra irbesartan 150 mg once a day, for total of 300 mg in a day, if systolic is > 342 mm Hg.

## 2021-11-06 NOTE — Telephone Encounter (Signed)
Patient notified.  Most recent BP readings have been 135-150/80 ?

## 2021-11-07 ENCOUNTER — Other Ambulatory Visit: Payer: Self-pay | Admitting: Internal Medicine

## 2021-11-07 ENCOUNTER — Ambulatory Visit
Admission: RE | Admit: 2021-11-07 | Discharge: 2021-11-07 | Disposition: A | Discharge status: Home / Self Care | Payer: Medicare HMO | Source: Ambulatory Visit | Attending: Internal Medicine | Admitting: Internal Medicine

## 2021-11-07 DIAGNOSIS — J4540 Moderate persistent asthma, uncomplicated: Secondary | ICD-10-CM

## 2021-11-07 DIAGNOSIS — J45901 Unspecified asthma with (acute) exacerbation: Secondary | ICD-10-CM | POA: Diagnosis not present

## 2021-11-07 DIAGNOSIS — M954 Acquired deformity of chest and rib: Secondary | ICD-10-CM | POA: Diagnosis not present

## 2021-11-07 DIAGNOSIS — Z951 Presence of aortocoronary bypass graft: Secondary | ICD-10-CM | POA: Diagnosis not present

## 2021-11-07 DIAGNOSIS — R052 Subacute cough: Secondary | ICD-10-CM | POA: Diagnosis not present

## 2021-11-20 ENCOUNTER — Other Ambulatory Visit: Payer: Self-pay | Admitting: Interventional Cardiology

## 2021-11-21 DIAGNOSIS — H2512 Age-related nuclear cataract, left eye: Secondary | ICD-10-CM | POA: Diagnosis not present

## 2021-11-25 ENCOUNTER — Encounter (INDEPENDENT_AMBULATORY_CARE_PROVIDER_SITE_OTHER): Payer: Self-pay | Admitting: Ophthalmology

## 2021-11-25 ENCOUNTER — Ambulatory Visit (INDEPENDENT_AMBULATORY_CARE_PROVIDER_SITE_OTHER): Payer: Medicare HMO | Admitting: Ophthalmology

## 2021-11-25 DIAGNOSIS — H2511 Age-related nuclear cataract, right eye: Secondary | ICD-10-CM | POA: Diagnosis not present

## 2021-11-25 DIAGNOSIS — H43821 Vitreomacular adhesion, right eye: Secondary | ICD-10-CM

## 2021-11-25 DIAGNOSIS — H353221 Exudative age-related macular degeneration, left eye, with active choroidal neovascularization: Secondary | ICD-10-CM | POA: Diagnosis not present

## 2021-11-25 DIAGNOSIS — H43822 Vitreomacular adhesion, left eye: Secondary | ICD-10-CM

## 2021-11-25 DIAGNOSIS — H353132 Nonexudative age-related macular degeneration, bilateral, intermediate dry stage: Secondary | ICD-10-CM

## 2021-11-25 MED ORDER — AFLIBERCEPT 2MG/0.05ML IZ SOLN FOR KALEIDOSCOPE
2.0000 mg | INTRAVITREAL | Status: AC | PRN
  Administered 2021-11-25: 2 mg via INTRAVITREAL

## 2021-11-25 NOTE — Assessment & Plan Note (Signed)
As of today, PVD has developed OS ?

## 2021-11-25 NOTE — Assessment & Plan Note (Signed)
OD, physiologic no impact on acuity ?

## 2021-11-25 NOTE — Assessment & Plan Note (Signed)
No sign of CNVM OD 

## 2021-11-25 NOTE — Assessment & Plan Note (Signed)
OS chronic subfoveal vascularized PED with subretinal fluid.  Today at 5.5 weeks post most recent injection Eylea.  We will repeat injection today to maintain. ?

## 2021-11-25 NOTE — Assessment & Plan Note (Signed)
OD, upcoming surgery with Dr. Warden Fillers ?

## 2021-11-25 NOTE — Progress Notes (Signed)
? ? ?11/25/2021 ? ?  ? ?CHIEF COMPLAINT ?Patient presents for  ?Chief Complaint  ?Patient presents with  ? Macular Degeneration  ? ? ?History of wet AMD OS, with subfoveal vascularized PED.  Moved acuity and sensation of improvement with recent cataract surgery left eye under the direction of Dr. Katy Fitch ? ?HISTORY OF PRESENT ILLNESS: ?Melissa Elliott is a 81 y.o. female who presents to the clinic today for:  ? ?HPI   ?5 weeks dilate OS, Eylea OS, OCT. ?PATIENT ALLERGY TO CIPROFLOXACIN. ?Patient states Dr. Midge Aver did cataract surgery on her left eye last Thursday. Patient reports "everything is brighter. Things are clearer." The right eye is scheduled for 12/05/2021. ?Patient is using eye drops post cataract surgery, but is not sure the names. States "Prednisolone, Tobramycin, and Ketorolac I think." ?Last edited by Laurin Coder on 11/25/2021  2:34 PM.  ?  ? ? ?Referring physician: ?Warden Fillers, MD ?[address] ?[address] ?[place],  [place] [phone] ? ?HISTORICAL INFORMATION:  ? ?Selected notes from the [facility] ?  ?   ? ?CURRENT MEDICATIONS: ?No current outpatient medications on file. (Ophthalmic Drugs)  ? ?No current facility-administered medications for this visit. (Ophthalmic Drugs)  ? ?Current Outpatient Medications (Other)  ?Medication Sig  ? ADVAIR DISKUS 250-50 MCG/DOSE AEPB Inhale 1 puff into the lungs 2 (two) times daily. Only uses in winter and spring months  ? albuterol (PROVENTIL HFA;VENTOLIN HFA) 108 (90 Base) MCG/ACT inhaler Inhale 2 puffs into the lungs every 6 (six) hours as needed for wheezing.  ? aspirin 81 MG EC tablet Take 81 mg by mouth daily. Swallow whole.  ? Calcium Carbonate-Vitamin D (CALCIUM-D) 600-400 MG-UNIT TABS Take 1 tablet by mouth daily in the afternoon.   ? cholecalciferol (VITAMIN D) 1000 units tablet Take 1,000 Units by mouth daily with lunch.  ? clopidogrel (PLAVIX) 75 MG tablet Take 1 tablet (75 mg total) by mouth daily.  ? fexofenadine (ALLEGRA) 180 MG  tablet Take 180 mg by mouth daily.  ? irbesartan (AVAPRO) 150 MG tablet TAKE 1 TABLET(150 MG) BY MOUTH DAILY  ? metFORMIN (GLUCOPHAGE) 500 MG tablet Take 500 mg by mouth daily. with food  ? metoprolol succinate (TOPROL-XL) 50 MG 24 hr tablet Take 50 mg by mouth daily.   ? montelukast (SINGULAIR) 10 MG tablet Take 10 mg by mouth at bedtime.  ? Multiple Vitamins-Minerals (PRESERVISION AREDS 2) CAPS Take 1 capsule by mouth 2 (two) times daily.  ? nitroGLYCERIN (NITROSTAT) 0.4 MG SL tablet Place 0.4 mg under the tongue every 5 (five) minutes as needed for chest pain (UP TO 3 DOSES BEFORE CALLING EMS).  ? simvastatin (ZOCOR) 40 MG tablet Take 40 mg by mouth at bedtime.   ? ?No current facility-administered medications for this visit. (Other)  ? ? ? ? ?REVIEW OF SYSTEMS: ?ROS   ?Negative for: Constitutional, Gastrointestinal, Neurological, Skin, Genitourinary, Musculoskeletal, HENT, Endocrine, Cardiovascular, Eyes, Respiratory, Psychiatric, Allergic/Imm, Heme/Lymph ?Last edited by Hurman Horn, MD on 11/25/2021  3:07 PM.  ?  ? ? ? ?ALLERGIES ?Allergies  ?Allergen Reactions  ? Ciprofloxacin Nausea And Vomiting  ?  Severe "dry heaves"  ? Sulfa Antibiotics Swelling  ?  Swelling in face, neck, arms, hands.   ? Iodine Hives  ? Eggs Or Egg-Derived Products Nausea And Vomiting  ?  "sometimes I can eat eggs; sometimes I can't"  ? Penicillins   ?  As a child ?Has patient had a PCN reaction causing immediate rash,  facial/tongue/throat swelling, SOB or lightheadedness with hypotension: Unknown ?Has patient had a PCN reaction causing severe rash involving mucus membranes or skin necrosis: Unknown ?Has patient had a PCN reaction that required hospitalization: Unknown ?Has patient had a PCN reaction occurring within the last 10 years: Unknown ?If all of the above answers are "NO", then may proceed with Cephalosporin use. ?  ? ? ?PAST MEDICAL HISTORY ?Past Medical History:  ?Diagnosis Date  ? Asthma   ? Complication of anesthesia   ?  "takes a long time to wakeup; sometimes days" (05/11/2018)  ? Coronary artery disease   ? H/O hiatal hernia   ? Heart murmur   ? High cholesterol   ? "on RX because of the bypass" (02/01/2013)  ? History of duodenal ulcer   ? Hot flashes   ? "on a regular basis" (05/11/2018)  ? Hypertension   ? Hypoglycemia   ? "that's something I have to watch constantly" (02/01/2013)  ? Migraine   ? "probably stopped w/menses" (05/11/2018)  ? Pneumonia ? 1970's  ? "once" (05/11/2018)  ? Sciatic nerve pain 12/2012  ? "RLE; just had it once" (05/11/2018)  ? ?Past Surgical History:  ?Procedure Laterality Date  ? ABDOMINAL AORTOGRAM W/LOWER EXTREMITY N/A 02/08/2020  ? Procedure: ABDOMINAL AORTOGRAM W/LOWER EXTREMITY;  Surgeon: Wellington Hampshire, MD;  Location: [facility];  Service: Cardiovascular;  Laterality: N/A;  ? APPENDECTOMY  1970  ? BREAST BIOPSY Left   ? benign  ? COLONOSCOPY WITH PROPOFOL N/A 06/19/2015  ? Procedure: COLONOSCOPY WITH PROPOFOL;  Surgeon: Garlan Fair, MD;  Location: [facility];  Service: Endoscopy;  Laterality: N/A;  ? CORONARY ANGIOPLASTY WITH STENT PLACEMENT  05/11/2018  ? CORONARY ANGIOPLASTY WITH STENT PLACEMENT  1997  ? CORONARY ARTERY BYPASS GRAFT  ~ 1997  ? CABG X2  ? CORONARY STENT INTERVENTION N/A 05/11/2018  ? Procedure: CORONARY STENT INTERVENTION;  Surgeon: Jettie Booze, MD;  Location: [facility];  Service: Cardiovascular;  Laterality: N/A;  ? ESOPHAGOGASTRODUODENOSCOPY N/A 02/02/2013  ? Procedure: ESOPHAGOGASTRODUODENOSCOPY (EGD);  Surgeon: Wonda Horner, MD;  Location: [facility];  Service: Endoscopy;  Laterality: N/A;  ? LEFT HEART CATH AND CORS/GRAFTS ANGIOGRAPHY N/A 05/11/2018  ? Procedure: LEFT HEART CATH AND CORS/GRAFTS ANGIOGRAPHY;  Surgeon: Jettie Booze, MD;  Location: [facility];  Service: Cardiovascular;  Laterality: N/A;  ? LEFT HEART CATH AND CORS/GRAFTS ANGIOGRAPHY N/A 05/14/2018  ? Procedure: LEFT HEART CATH AND CORS/GRAFTS ANGIOGRAPHY;   Surgeon: Jettie Booze, MD;  Location: [facility];  Service: Cardiovascular;  Laterality: N/A;  ? PERIPHERAL VASCULAR ATHERECTOMY Left 02/08/2020  ? Procedure: PERIPHERAL VASCULAR ATHERECTOMY;  Surgeon: Wellington Hampshire, MD;  Location: [facility];  Service: Cardiovascular;  Laterality: Left;  sfa (distal)  ? PERIPHERAL VASCULAR BALLOON ANGIOPLASTY Left 02/08/2020  ? Procedure: PERIPHERAL VASCULAR BALLOON ANGIOPLASTY;  Surgeon: Wellington Hampshire, MD;  Location: [facility];  Service: Cardiovascular;  Laterality: Left;  sfa (distal)  ? SKIN LESION EXCISION Left 1960's  ? "upper arm; sore that never healed" (02/01/2013)  ? TONSILLECTOMY  1940's  ? ? ?FAMILY HISTORY ?Family History  ?Problem Relation Age of Onset  ? CAD Mother   ? Hypertension Mother   ? Asthma Mother   ? Heart attack Father   ? Hypertension Father   ? Hypertension Maternal Grandmother   ? Hypertension Maternal Grandfather   ? Breast cancer Paternal Aunt   ? Stroke Neg Hx   ? ? ?  SOCIAL HISTORY ?Social History  ? ?Tobacco Use  ? Smoking status: Never  ? Smokeless tobacco: Never  ? Tobacco comments:  ?  Exposure through parents.  ?Vaping Use  ? Vaping Use: Never used  ?Substance Use Topics  ? Alcohol use: Yes  ?  Alcohol/week: 0.0 standard drinks  ?  Comment: 05/11/2018  "glass of wine maybe once/month"  ? Drug use: Never  ? ?  ? ?  ? ?OPHTHALMIC EXAM: ? ?Base Eye Exam   ? ? Visual Acuity (ETDRS)   ? ?   Right Left  ? Dist [place]  20/20 -1  ? Dist cc 20/40   ? Dist ph cc 20/25 -1   ? ? Correction: Glasses  ? ?  ?  ? ? Tonometry (Tonopen, 2:32 PM)   ? ?   Right Left  ? Pressure 14 7  ? ?  ?  ? ? Pupils   ? ?   Pupils Dark Light React APD  ? Right PERRL 3 3 Minimal None  ? Left PERRL 3 3 Minimal None  ? ?  ?  ? ? Extraocular Movement   ? ?   Right Left  ?  Full Full  ? ?  ?  ? ? Neuro/Psych   ? ? Oriented x3: Yes  ? Mood/Affect: Normal  ? ?  ?  ? ? Dilation   ? ? Left eye: 1.0% Mydriacyl, 2.5% Phenylephrine @ 2:32 PM  ? ?  ?  ? ?   ? ?Slit Lamp and Fundus Exam   ? ? External Exam   ? ?   Right Left  ? External Normal Normal  ? ?  ?  ? ? Slit Lamp Exam   ? ?   Right Left  ? Lids/Lashes Normal Normal  ? Conjunctiva/Sclera White and quiet

## 2021-12-01 DIAGNOSIS — H2511 Age-related nuclear cataract, right eye: Secondary | ICD-10-CM | POA: Diagnosis not present

## 2021-12-05 DIAGNOSIS — H2511 Age-related nuclear cataract, right eye: Secondary | ICD-10-CM | POA: Diagnosis not present

## 2021-12-11 ENCOUNTER — Encounter: Payer: Self-pay | Admitting: Pulmonary Disease

## 2021-12-11 ENCOUNTER — Ambulatory Visit: Payer: Medicare HMO | Admitting: Pulmonary Disease

## 2021-12-11 VITALS — BP 130/68 | HR 90 | Temp 98.5°F | Ht 63.0 in | Wt 123.0 lb

## 2021-12-11 DIAGNOSIS — R7303 Prediabetes: Secondary | ICD-10-CM | POA: Diagnosis not present

## 2021-12-11 DIAGNOSIS — J4530 Mild persistent asthma, uncomplicated: Secondary | ICD-10-CM | POA: Diagnosis not present

## 2021-12-11 DIAGNOSIS — Z1331 Encounter for screening for depression: Secondary | ICD-10-CM | POA: Diagnosis not present

## 2021-12-11 DIAGNOSIS — J302 Other seasonal allergic rhinitis: Secondary | ICD-10-CM

## 2021-12-11 DIAGNOSIS — I25810 Atherosclerosis of coronary artery bypass graft(s) without angina pectoris: Secondary | ICD-10-CM | POA: Diagnosis not present

## 2021-12-11 DIAGNOSIS — I739 Peripheral vascular disease, unspecified: Secondary | ICD-10-CM | POA: Diagnosis not present

## 2021-12-11 DIAGNOSIS — I10 Essential (primary) hypertension: Secondary | ICD-10-CM | POA: Diagnosis not present

## 2021-12-11 DIAGNOSIS — Z0000 Encounter for general adult medical examination without abnormal findings: Secondary | ICD-10-CM | POA: Diagnosis not present

## 2021-12-11 DIAGNOSIS — E782 Mixed hyperlipidemia: Secondary | ICD-10-CM | POA: Diagnosis not present

## 2021-12-11 DIAGNOSIS — M8580 Other specified disorders of bone density and structure, unspecified site: Secondary | ICD-10-CM | POA: Diagnosis not present

## 2021-12-11 DIAGNOSIS — N182 Chronic kidney disease, stage 2 (mild): Secondary | ICD-10-CM | POA: Diagnosis not present

## 2021-12-11 DIAGNOSIS — I700 Atherosclerosis of aorta: Secondary | ICD-10-CM | POA: Diagnosis not present

## 2021-12-11 DIAGNOSIS — Z23 Encounter for immunization: Secondary | ICD-10-CM | POA: Diagnosis not present

## 2021-12-11 NOTE — Patient Instructions (Signed)
Nice to meet you  Continue Advair, montelukast, and allegra - let's start it a little earlier this season.  If things get out of hand earlier than anticipated, please contact us and we will see you sooner.  Return to clinic in 6 months or sooner as needed

## 2021-12-11 NOTE — Progress Notes (Signed)
$'@Patient'B$  ID: Melissa Elliott, female    DOB: [birth year], 81 y.o.   MRN: [id number]  Chief Complaint  Patient presents with   Consult    Pt is here for consult for her asthma. Dx in late 4s. Pt states she has chest tightness and an unproductive cough with her asthma. Pt only take her inhalers from feb-may. Pt takes Advair and Albuterol as needed.     Referring provider: Charlane Ferretti, MD  HPI:   81 y.o. whom are seen in consultation for evaluation of asthma.  Most recent pulmonary note 2017 reviewed.  Patient has longstanding history of asthma.  Had recurrent bronchitis for many years.  Took a long time to come to the diagnosis.  Eventually at [employer] [facility] [employer] was told this many years ago.  Placed on inhalers.  Asthma symptoms are started well controlled.  Only during the fall to winter months.  Last season was particularly bad.  Got 2 courses of prednisone.  Feel like she got behind on the breathing.  Had cough for months.  It been 2 or 3 years since her last course of prednisone.  She feels like Advair historically helps.  As does albuterol historically.  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  Review most recent chest x-ray 11/07/2021 and on my interpretation reveals mild hyperinflation on the lateral view otherwise clear.  Family here eosinophil count was 300 in 2019.  PMH: Asthma, hypertension Surgical history: CABG, tonsillectomy Family history: Mother with CAD, hypertension, asthma, father with CAD, hypertension Social history: Never smoker, lives in [place] / Pulmonary Flowsheets:   ACT:      View : No data to display.          MMRC:     View : No data to display.          [facility]:      View : No data to display.          Tests:   FENO:  No results found for: NITRICOXIDE  PFT:    Latest Ref Rng & Units 04/18/2016    2:56 PM 01/08/2016    1:00 PM  PFT Results   FVC-Pre L 2.53  P 2.49    FVC-Predicted Pre % 92  P 90    FVC-Post L 2.49  P 2.55    FVC-Predicted Post % 91  P 93    Pre FEV1/FVC % % 69  P 69    Post FEV1/FCV % % 70  P 68    FEV1-Pre L 1.73  P 1.72    FEV1-Predicted Pre % 84  P 83    FEV1-Post L 1.74  P 1.75    DLCO uncorrected ml/min/mmHg  15.56    DLCO UNC% %  67    DLCO corrected ml/min/mmHg  15.12    DLCO COR %Predicted %  65    DLVA Predicted %  70    TLC L  5.04    TLC % Predicted %  102    RV % Predicted %  102      P Preliminary result  Personally reviewed and interpreted as normal spirometry without significant bronchodilator response  WALK:      View : No data to display.          Imaging: Personally reviewed and as per EMR discussion this note Intravitreal Injection, Pharmacologic Agent - OS - Left Eye  Result Date: 11/25/2021 Time Out 11/25/2021. 3:10 PM. Confirmed correct patient, procedure, site, and patient consented. Anesthesia Subconjunctival anesthesia was used. Anesthetic medications included Lidocaine 4%. Procedure Preparation included 10% betadine to eyelids, 5% betadine to ocular surface, Tobramycin 0.3%. A 30 gauge needle was used. Injection: 2 mg aflibercept 2 MG/0.05ML   Route: Intravitreal, Site: Left Eye   NDC: [id number], Lot: [id number], Waste: 0 mL Post-op Post injection exam found visual acuity of at least counting fingers. The patient tolerated the procedure well. There were no complications. The patient received written and verbal post procedure care education. Post injection medications were not given.   OCT, Retina - OU - Both Eyes  Result Date: 11/25/2021 Right Eye Quality was good. Scan locations included subfoveal. Central Foveal Thickness: 245. Progression has been stable. Findings include normal foveal contour, retinal drusen . Left Eye Quality was good. Scan locations included subfoveal. Central Foveal Thickness: 396. Progression has improved. Findings include abnormal foveal  contour, subretinal fluid, pigment epithelial detachment, retinal drusen , vitreous traction. 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  We will need to repeat injection Eylea OS today and reevaluate in 5 weeks due to disease activity OD no sign of CNVM   Lab Results: Personally reviewed CBC    Component Value Date/Time   WBC 8.6 02/02/2020 1429   WBC 11.3 (H) 05/14/2018 0151   RBC 4.17 02/02/2020 1429   RBC 4.17 05/14/2018 0151   HGB 12.2 02/02/2020 1429   HCT 36.2 02/02/2020 1429   PLT 289 02/02/2020 1429   MCV 87 02/02/2020 1429   MCH 29.3 02/02/2020 1429   MCH 27.3 05/14/2018 0151   MCHC 33.7 02/02/2020 1429   MCHC 30.4 05/14/2018 0151   RDW 12.8 02/02/2020 1429   LYMPHSABS 2.0 10/09/2015 1444   MONOABS 0.8 10/09/2015 1444   EOSABS 0.3 10/09/2015 1444   BASOSABS 0.3 (H) 10/09/2015 1444    BMET    Component Value Date/Time   NA 142 02/02/2020 1429   K 5.1 02/02/2020 1429   CL 102 02/02/2020 1429   CO2 24 02/02/2020 1429   GLUCOSE 136 (H) 02/02/2020 1429   GLUCOSE 200 (H) 05/14/2018 0804   BUN 17 02/02/2020 1429   CREATININE 1.11 (H) 02/02/2020 1429   CALCIUM 9.1 02/02/2020 1429   GFRNONAA 48 (L) 02/02/2020 1429   GFRAA 55 (L) 02/02/2020 1429    BNP No results found for: BNP  ProBNP No results found for: PROBNP  Specialty Problems       Pulmonary Problems   Asthma   Allergic rhinitis   Chronic cough    PND vs GERD vs allergic        Allergies  Allergen Reactions   Ciprofloxacin Nausea And Vomiting    Severe "dry heaves"   Sulfa Antibiotics Swelling    Swelling in face, neck, arms, hands.    Iodine Hives   Eggs Or Egg-Derived Products Nausea And Vomiting    "sometimes I can eat eggs; sometimes I can't"   Penicillins     As a child Has patient had a PCN reaction causing immediate  rash, facial/tongue/throat swelling, SOB or lightheadedness with hypotension: Unknown Has patient had a PCN reaction causing severe rash involving mucus membranes or skin necrosis: Unknown Has patient had a PCN reaction that required hospitalization: Unknown Has patient had a PCN reaction occurring within the last 10 years: Unknown If all  of the above answers are "NO", then may proceed with Cephalosporin use.     Immunization History  Administered Date(s) Administered   Influenza Split 05/22/2019, 05/25/2020   Influenza,inj,quad, With Preservative 04/13/2020   PFIZER(Purple Top)SARS-COV-2 Vaccination 08/24/2019, 09/14/2019, 02/07/2020, 05/02/2020, 01/28/2021   Pneumococcal Conjugate-13 12/19/2013, 07/15/2015   Pneumococcal Polysaccharide-23 07/15/2003, 11/12/2011, 05/24/2018   Td 05/09/2001, 11/12/2011   Zoster, Live 11/08/2007, 12/07/2020, 06/03/2021    Past Medical History:  Diagnosis Date   Asthma    Complication of anesthesia    "takes a long time to wakeup; sometimes days" (05/11/2018)   Coronary artery disease    H/O hiatal hernia    Heart murmur    High cholesterol    "on RX because of the bypass" (02/01/2013)   History of duodenal ulcer    Hot flashes    "on a regular basis" (05/11/2018)   Hypertension    Hypoglycemia    "that's something I have to watch constantly" (02/01/2013)   Migraine    "probably stopped w/menses" (05/11/2018)   Pneumonia ? 1970's   "once" (05/11/2018)   Sciatic nerve pain 12/2012   "RLE; just had it once" (05/11/2018)    Tobacco History: Social History   Tobacco Use  Smoking Status Never  Smokeless Tobacco Never  Tobacco Comments   Exposure through parents.   Counseling given: Not Answered Tobacco comments: Exposure through parents.   Continue to not smoke  Outpatient Encounter Medications as of 12/11/2021  Medication Sig   ADVAIR DISKUS 250-50 MCG/DOSE AEPB Inhale 1 puff into the lungs 2 (two) times daily. Only uses in winter  and spring months   albuterol (PROVENTIL HFA;VENTOLIN HFA) 108 (90 Base) MCG/ACT inhaler Inhale 2 puffs into the lungs every 6 (six) hours as needed for wheezing.   aspirin 81 MG EC tablet Take 81 mg by mouth daily. Swallow whole.   Calcium Carbonate-Vitamin D (CALCIUM-D) 600-400 MG-UNIT TABS Take 1 tablet by mouth daily in the afternoon.    cholecalciferol (VITAMIN D) 1000 units tablet Take 1,000 Units by mouth daily with lunch.   clopidogrel (PLAVIX) 75 MG tablet Take 1 tablet (75 mg total) by mouth daily.   fexofenadine (ALLEGRA) 180 MG tablet Take 180 mg by mouth daily.   irbesartan (AVAPRO) 150 MG tablet TAKE 1 TABLET(150 MG) BY MOUTH DAILY   metFORMIN (GLUCOPHAGE) 500 MG tablet Take 500 mg by mouth daily. with food   metoprolol succinate (TOPROL-XL) 50 MG 24 hr tablet Take 50 mg by mouth daily.    montelukast (SINGULAIR) 10 MG tablet Take 10 mg by mouth at bedtime.   Multiple Vitamins-Minerals (PRESERVISION AREDS 2) CAPS Take 1 capsule by mouth 2 (two) times daily.   nitroGLYCERIN (NITROSTAT) 0.4 MG SL tablet Place 0.4 mg under the tongue every 5 (five) minutes as needed for chest pain (UP TO 3 DOSES BEFORE CALLING EMS).   simvastatin (ZOCOR) 40 MG tablet Take 40 mg by mouth at bedtime.    No facility-administered encounter medications on file as of 12/11/2021.     Review of Systems  Review of Systems  No chest pain with exertion.  No orthopnea or PND.  Comprehensive review of systems otherwise negative. Physical Exam  BP 130/68 (BP Location: Right Arm, Patient Position: Sitting, Cuff Size: Normal)   Pulse 90   Temp 98.5 F (36.9 C) (Oral)   Ht '5\' 3"'$  (1.6 m)   Wt 123 lb (55.8 kg)   SpO2 96%   BMI 21.79 kg/m   Wt Readings from  Last 5 Encounters:  12/11/21 123 lb (55.8 kg)  10/15/21 123 lb (55.8 kg)  10/16/20 122 lb 3.2 oz (55.4 kg)  10/01/20 124 lb (56.2 kg)  03/06/20 120 lb (54.4 kg)    BMI Readings from Last 5 Encounters:  12/11/21 21.79 kg/m  10/15/21 21.11 kg/m   10/16/20 20.98 kg/m  10/01/20 21.28 kg/m  03/06/20 20.60 kg/m     Physical Exam General: Well-appearing, no acute distress Eyes: EOMI, no icterus Neck: Supple, no JVP Pulmonary: Clear, no wheeze, normal work of breathing Cardiovascular: Regular rate and rhythm, no murmur Abdomen: Nondistended, bowel sounds present MSK: No synovitis, joint effusion Neuro: Normal gait, no weakness Psych: Normal mood, full affect  Assessment & Plan:   Asthma: Diagnosed many years ago.  Historically well controlled.  Usually symptomatic only in the fall or winter.  Prickly bad winter 2022 2023 with 2 prednisone courses.  Had been 2 or 3 years since last prednisone course.  To continue Advair discus mid dose, montelukast, Allegra with encouragement to start earlier in the year, late summer as opposed to early fall to help head things off in the past.  If continues to have uncontrolled symptoms despite resuming asthma and allergy medications earlier, would recommend escalated to high-dose Advair discus versus triple inhaled therapy given recurrent prednisone dosing.  Cough: Associated with asthma flares.  Likely due to uncontrolled asthma.  Not of concern right now.  See above.   Return in about 6 months (around 06/12/2022).   Lanier Clam, MD 12/11/2021

## 2021-12-24 ENCOUNTER — Ambulatory Visit: Payer: Medicare HMO | Admitting: Cardiovascular Disease

## 2021-12-24 ENCOUNTER — Encounter: Payer: Self-pay | Admitting: Cardiovascular Disease

## 2021-12-24 VITALS — BP 128/76 | HR 97 | Ht 63.5 in | Wt 121.6 lb

## 2021-12-24 DIAGNOSIS — I2510 Atherosclerotic heart disease of native coronary artery without angina pectoris: Secondary | ICD-10-CM

## 2021-12-24 DIAGNOSIS — I10 Essential (primary) hypertension: Secondary | ICD-10-CM | POA: Diagnosis not present

## 2021-12-24 DIAGNOSIS — E785 Hyperlipidemia, unspecified: Secondary | ICD-10-CM | POA: Diagnosis not present

## 2021-12-24 DIAGNOSIS — I739 Peripheral vascular disease, unspecified: Secondary | ICD-10-CM

## 2021-12-24 NOTE — Patient Instructions (Signed)
Medication Instructions:  No changes *If you need a refill on your cardiac medications before your next appointment, please call your pharmacy*   Lab Work: None ordered If you have labs (blood work) drawn today and your tests are completely normal, you will receive your results only by: MyChart Message (if you have MyChart) OR A paper copy in the mail If you have any lab test that is abnormal or we need to change your treatment, we will call you to review the results.   Testing/Procedures: None ordered   Follow-Up: At [facility], you and your health needs are our priority.  As part of our continuing mission to provide you with exceptional heart care, we have created designated Provider Care Teams.  These Care Teams include your primary Cardiologist (physician) and Advanced Practice Providers (APPs -  Physician Assistants and Nurse Practitioners) who all work together to provide you with the care you need, when you need it.  We recommend signing up for the patient portal called "MyChart".  Sign up information is provided on this After Visit Summary.  MyChart is used to connect with patients for Virtual Visits (Telemedicine).  Patients are able to view lab/test results, encounter notes, upcoming appointments, etc.  Non-urgent messages can be sent to your provider as well.   To learn more about what you can do with MyChart, go to [email or web address].    Your next appointment:   6 month(s)  The format for your next appointment:   In Person  Provider:   Dr. Arida  Important Information About Sugar       

## 2021-12-24 NOTE — Progress Notes (Signed)
Cardiology Office Note   Date:  12/24/2021   ID:  Melissa Elliott, DOB Sep 19, 1940, MRN [id number]  PCP:  Wenda Low, MD  Cardiologist: Dr. Irish Lack  No chief complaint on file.     History of Present Illness: Melissa Elliott is a 81 y.o. female who is here today for follow-up visit regarding peripheral arterial disease.   She has known history of coronary artery disease status post CABG in 1996 with subsequent stenting.  She has chronic medical conditions that include hyperlipidemia, stomach ulcers, prediabetes, essential hypertension and migraines. She is a very active person and exercises on a regular basis.  She was seen in 2021 for progressive left calf claudication which significantly affected her ability to exercise. She underwent vascular studies in March which showed an ABI of 0.94 on the right and 0.79 on the left.  Duplex showed severe distal left SFA stenosis with peak velocity of 461.  Symptoms did not improve with the walking program and thus I proceeded with angiography in July, 2021 which showed no significant aortoiliac disease, there was severe stenosis in the distal left SFA with two-vessel runoff below the knee.  I performed successful orbital atherectomy and drug-coated balloon angioplasty to the distal left SFA.  Most recent Doppler studies in March of this year showed a drop in ABI on the right side to 0.85 and on the left at 0.80.  Duplex on the right showed moderate SFA disease and on the left showed significant mid to distal SFA stenosis.  She reports stable bilateral calf claudication that is slightly worse on the left than the right.  This happens after walking about a quarter of a mile.  She stays active and exercises on a regular basis.  She has an upcoming trip to [place].    Past Medical History:  Diagnosis Date   Asthma    Complication of anesthesia    "takes a long time to wakeup; sometimes days" (05/11/2018)   Coronary artery disease    H/O  hiatal hernia    Heart murmur    High cholesterol    "on RX because of the bypass" (02/01/2013)   History of duodenal ulcer    Hot flashes    "on a regular basis" (05/11/2018)   Hypertension    Hypoglycemia    "that's something I have to watch constantly" (02/01/2013)   Migraine    "probably stopped w/menses" (05/11/2018)   Pneumonia ? 1970's   "once" (05/11/2018)   Sciatic nerve pain 12/2012   "RLE; just had it once" (05/11/2018)    Past Surgical History:  Procedure Laterality Date   ABDOMINAL AORTOGRAM W/LOWER EXTREMITY N/A 02/08/2020   Procedure: ABDOMINAL AORTOGRAM W/LOWER EXTREMITY;  Surgeon: Wellington Hampshire, MD;  Location: [facility];  Service: Cardiovascular;  Laterality: N/A;   APPENDECTOMY  1970   BREAST BIOPSY Left    benign   COLONOSCOPY WITH PROPOFOL N/A 06/19/2015   Procedure: COLONOSCOPY WITH PROPOFOL;  Surgeon: Garlan Fair, MD;  Location: [facility];  Service: Endoscopy;  Laterality: N/A;   CORONARY ANGIOPLASTY WITH STENT PLACEMENT  05/11/2018   CORONARY ANGIOPLASTY WITH STENT PLACEMENT  1997   CORONARY ARTERY BYPASS GRAFT  ~ 1997   CABG X2   CORONARY STENT INTERVENTION N/A 05/11/2018   Procedure: CORONARY STENT INTERVENTION;  Surgeon: Jettie Booze, MD;  Location: [facility];  Service: Cardiovascular;  Laterality: N/A;   ESOPHAGOGASTRODUODENOSCOPY N/A 02/02/2013   Procedure: ESOPHAGOGASTRODUODENOSCOPY (EGD);  Surgeon:  Wonda Horner, MD;  Location: [facility]OS[facility];  Service: Endoscopy;  Laterality: N/A;   LEFT HEART CATH AND CORS/GRAFTS ANGIOGRAPHY N/A 05/11/2018   Procedure: LEFT HEART CATH AND CORS/GRAFTS ANGIOGRAPHY;  Surgeon: Jettie Booze, MD;  Location: [facility];  Service: Cardiovascular;  Laterality: N/A;   LEFT HEART CATH AND CORS/GRAFTS ANGIOGRAPHY N/A 05/14/2018   Procedure: LEFT HEART CATH AND CORS/GRAFTS ANGIOGRAPHY;  Surgeon: Jettie Booze, MD;  Location: [place];  Service: Cardiovascular;   Laterality: N/A;   PERIPHERAL VASCULAR ATHERECTOMY Left 02/08/2020   Procedure: PERIPHERAL VASCULAR ATHERECTOMY;  Surgeon: Wellington Hampshire, MD;  Location: [facility];  Service: Cardiovascular;  Laterality: Left;  sfa (distal)   PERIPHERAL VASCULAR BALLOON ANGIOPLASTY Left 02/08/2020   Procedure: PERIPHERAL VASCULAR BALLOON ANGIOPLASTY;  Surgeon: Wellington Hampshire, MD;  Location: [facility];  Service: Cardiovascular;  Laterality: Left;  sfa (distal)   SKIN LESION EXCISION Left 1960's   "upper arm; sore that never healed" (02/01/2013)   TONSILLECTOMY  1940's     Current Outpatient Medications  Medication Sig Dispense Refill   ADVAIR DISKUS 250-50 MCG/DOSE AEPB Inhale 1 puff into the lungs 2 (two) times daily. Only uses in winter and spring months  11   albuterol (PROVENTIL HFA;VENTOLIN HFA) 108 (90 Base) MCG/ACT inhaler Inhale 2 puffs into the lungs every 6 (six) hours as needed for wheezing. 1 Inhaler 3   aspirin 81 MG EC tablet Take 81 mg by mouth daily. Swallow whole.     Calcium Carbonate-Vitamin D (CALCIUM-D) 600-400 MG-UNIT TABS Take 1 tablet by mouth daily in the afternoon.      cholecalciferol (VITAMIN D) 1000 units tablet Take 1,000 Units by mouth daily with lunch.     clopidogrel (PLAVIX) 75 MG tablet Take 1 tablet (75 mg total) by mouth daily. 30 tablet 2   fexofenadine (ALLEGRA) 180 MG tablet Take 180 mg by mouth daily.     irbesartan (AVAPRO) 150 MG tablet TAKE 1 TABLET(150 MG) BY MOUTH DAILY 90 tablet 2   metFORMIN (GLUCOPHAGE) 500 MG tablet Take 500 mg by mouth daily. with food  1   metoprolol succinate (TOPROL-XL) 50 MG 24 hr tablet Take 50 mg by mouth daily.      montelukast (SINGULAIR) 10 MG tablet Take 10 mg by mouth at bedtime.     Multiple Vitamins-Minerals (PRESERVISION AREDS 2) CAPS Take 1 capsule by mouth 2 (two) times daily.     nitroGLYCERIN (NITROSTAT) 0.4 MG SL tablet Place 0.4 mg under the tongue every 5 (five) minutes as needed for chest pain (UP TO  3 DOSES BEFORE CALLING EMS).     simvastatin (ZOCOR) 40 MG tablet Take 40 mg by mouth at bedtime.      No current facility-administered medications for this visit.    Allergies:   Ciprofloxacin, Sulfa antibiotics, Iodine, Eggs or egg-derived products, and Penicillins    Social History:  The patient  reports that she has never smoked. She has never used smokeless tobacco. She reports current alcohol use. She reports that she does not use drugs.   Family History:  The patient's family history includes Asthma in her mother; Breast cancer in her paternal aunt; CAD in her mother; Heart attack in her father; Hypertension in her father, maternal grandfather, maternal grandmother, and mother.    ROS:  Please see the history of present illness.   Otherwise, review of systems are positive for none.   All other systems are  reviewed and negative.    PHYSICAL EXAM: VS:  There were no vitals taken for this visit. , BMI There is no height or weight on file to calculate BMI. GEN: Well nourished, well developed, in no acute distress  HEENT: normal  Neck: no JVD, carotid bruits, or masses Cardiac: RRR; no murmurs, rubs, or gallops,no edema  Respiratory:  clear to auscultation bilaterally, normal work of breathing GI: soft, nontender, nondistended, + BS MS: no deformity or atrophy  Skin: warm and dry, no rash Neuro:  Strength and sensation are intact Psych: euthymic mood, full affect  EKG:  EKG ordered today. EKG showed normal sinus rhythm with no significant ST or T wave changes.   Recent Labs: No results found for requested labs within last 365 days.    Lipid Panel    Component Value Date/Time   CHOL 157 05/14/2018 0203   TRIG 120 05/14/2018 0203   HDL 52 05/14/2018 0203   CHOLHDL 3.0 05/14/2018 0203   VLDL 24 05/14/2018 0203   LDLCALC 81 05/14/2018 0203      Wt Readings from Last 3 Encounters:  12/11/21 123 lb (55.8 kg)  10/15/21 123 lb (55.8 kg)  10/16/20 122 lb 3.2 oz (55.4 kg)             No data to display            ASSESSMENT AND PLAN:  1.  Peripheral arterial disease: Status post endovascular intervention on left SFA for severe claudication.  She now has mild to moderate bilateral calf claudication with evidence of SFA disease.  She has moderate claudications overall slightly worse on the left side.  I do not feel that her symptoms are lifestyle limiting.  I favor that we continue medical therapy for now and continued walking exercise program.  Angiography and revascularization can be considered for worsening symptoms.  2.  Coronary artery disease involving native coronary arteries without angina: She is overall doing well.  She is on dual antiplatelet therapy.  3.  Essential hypertension: Blood pressure is mildly elevated but usually is well controlled.  4.  Hyperlipidemia: Currently on simvastatin 40 mg daily.  Recommend a target LDL of less than 70.   Disposition:   FU with me in 6 months  Signed,  Kathlyn Sacramento, MD  12/24/2021 8:34 AM    [place]

## 2021-12-30 ENCOUNTER — Encounter (INDEPENDENT_AMBULATORY_CARE_PROVIDER_SITE_OTHER): Payer: Self-pay | Admitting: Ophthalmology

## 2021-12-30 ENCOUNTER — Encounter (INDEPENDENT_AMBULATORY_CARE_PROVIDER_SITE_OTHER): Payer: Medicare HMO | Admitting: Ophthalmology

## 2021-12-30 ENCOUNTER — Ambulatory Visit (INDEPENDENT_AMBULATORY_CARE_PROVIDER_SITE_OTHER): Payer: Medicare HMO | Admitting: Ophthalmology

## 2021-12-30 DIAGNOSIS — H353132 Nonexudative age-related macular degeneration, bilateral, intermediate dry stage: Secondary | ICD-10-CM

## 2021-12-30 DIAGNOSIS — H43822 Vitreomacular adhesion, left eye: Secondary | ICD-10-CM | POA: Diagnosis not present

## 2021-12-30 DIAGNOSIS — H353221 Exudative age-related macular degeneration, left eye, with active choroidal neovascularization: Secondary | ICD-10-CM

## 2021-12-30 MED ORDER — AFLIBERCEPT 2MG/0.05ML IZ SOLN FOR KALEIDOSCOPE
2.0000 mg | INTRAVITREAL | Status: AC | PRN
  Administered 2021-12-30: 2 mg via INTRAVITREAL

## 2021-12-30 MED ORDER — FLUORESCEIN SODIUM 10 % IV SOLN
500.0000 mg | INTRAVENOUS | Status: AC | PRN
  Administered 2021-12-30: 500 mg via INTRAVENOUS

## 2021-12-30 NOTE — Assessment & Plan Note (Signed)
No sign of CNVM OD 

## 2021-12-30 NOTE — Assessment & Plan Note (Signed)
OS stabilized findings with macular subfoveal PED/CNVM.  Repeat injection Eylea today may consider extension of treatment interval next

## 2021-12-30 NOTE — Progress Notes (Signed)
12/30/2021     CHIEF COMPLAINT Patient presents for  Chief Complaint  Patient presents with   Macular Degeneration      HISTORY OF PRESENT ILLNESS: Melissa Elliott is a 81 y.o. female who presents to the clinic today for:   HPI   5 weeks dilate OU, Optos FFA L/R, FP, Eylea OCT OS. Patient reports "they did the second cataract surgery on the 25th of May, vision has improved." Last edited by Laurin Coder on 12/30/2021  3:34 PM.      Referring physician: Wenda Low, MD [address],  [place] [address]  HISTORICAL INFORMATION:   Selected notes from the [facility]: No current outpatient medications on file. (Ophthalmic Drugs)   No current facility-administered medications for this visit. (Ophthalmic Drugs)   Current Outpatient Medications (Other)  Medication Sig   ADVAIR DISKUS 250-50 MCG/DOSE AEPB Inhale 1 puff into the lungs 2 (two) times daily. Only uses in winter and spring months   albuterol (PROVENTIL HFA;VENTOLIN HFA) 108 (90 Base) MCG/ACT inhaler Inhale 2 puffs into the lungs every 6 (six) hours as needed for wheezing.   aspirin 81 MG EC tablet Take 81 mg by mouth daily. Swallow whole.   Calcium Carbonate-Vitamin D (CALCIUM-D) 600-400 MG-UNIT TABS Take 1 tablet by mouth daily in the afternoon.    cholecalciferol (VITAMIN D) 1000 units tablet Take 1,000 Units by mouth daily with lunch.   clopidogrel (PLAVIX) 75 MG tablet Take 1 tablet (75 mg total) by mouth daily.   fexofenadine (ALLEGRA) 180 MG tablet Take 180 mg by mouth daily.   irbesartan (AVAPRO) 150 MG tablet TAKE 1 TABLET(150 MG) BY MOUTH DAILY   metFORMIN (GLUCOPHAGE) 500 MG tablet Take 500 mg by mouth daily. with food   metoprolol succinate (TOPROL-XL) 50 MG 24 hr tablet Take 50 mg by mouth daily.    montelukast (SINGULAIR) 10 MG tablet Take 10 mg by mouth at bedtime.   Multiple Vitamins-Minerals (PRESERVISION AREDS 2) CAPS Take 1  capsule by mouth 2 (two) times daily.   nitroGLYCERIN (NITROSTAT) 0.4 MG SL tablet Place 0.4 mg under the tongue every 5 (five) minutes as needed for chest pain (UP TO 3 DOSES BEFORE CALLING EMS).   simvastatin (ZOCOR) 40 MG tablet Take 40 mg by mouth at bedtime.    No current facility-administered medications for this visit. (Other)      REVIEW OF SYSTEMS: ROS   Negative for: Constitutional, Gastrointestinal, Neurological, Skin, Genitourinary, Musculoskeletal, HENT, Endocrine, Cardiovascular, Eyes, Respiratory, Psychiatric, Allergic/Imm, Heme/Lymph Last edited by Hurman Horn, MD on 12/30/2021  4:20 PM.       ALLERGIES Allergies  Allergen Reactions   Ciprofloxacin Nausea And Vomiting    Severe "dry heaves"   Sulfa Antibiotics Swelling    Swelling in face, neck, arms, hands.    Iodine Hives   Eggs Or Egg-Derived Products Nausea And Vomiting    "sometimes I can eat eggs; sometimes I can't"   Penicillins     As a child Has patient had a PCN reaction causing immediate rash, facial/tongue/throat swelling, SOB or lightheadedness with hypotension: Unknown Has patient had a PCN reaction causing severe rash involving mucus membranes or skin necrosis: Unknown Has patient had a PCN reaction that required hospitalization: Unknown Has patient had a PCN reaction occurring within the last 10 years: Unknown If all of the above answers are "NO", then may proceed with Cephalosporin use.  PAST MEDICAL HISTORY Past Medical History:  Diagnosis Date   Asthma    Complication of anesthesia    "takes a long time to wakeup; sometimes days" (05/11/2018)   Coronary artery disease    H/O hiatal hernia    Heart murmur    High cholesterol    "on RX because of the bypass" (02/01/2013)   History of duodenal ulcer    Hot flashes    "on a regular basis" (05/11/2018)   Hypertension    Hypoglycemia    "that's something I have to watch constantly" (02/01/2013)   Migraine    "probably stopped  w/menses" (05/11/2018)   Pneumonia ? 1970's   "once" (05/11/2018)   Sciatic nerve pain 12/2012   "RLE; just had it once" (05/11/2018)   Past Surgical History:  Procedure Laterality Date   ABDOMINAL AORTOGRAM W/LOWER EXTREMITY N/A 02/08/2020   Procedure: ABDOMINAL AORTOGRAM W/LOWER EXTREMITY;  Surgeon: Wellington Hampshire, MD;  Location: [facility];  Service: Cardiovascular;  Laterality: N/A;   APPENDECTOMY  1970   BREAST BIOPSY Left    benign   COLONOSCOPY WITH PROPOFOL N/A 06/19/2015   Procedure: COLONOSCOPY WITH PROPOFOL;  Surgeon: Garlan Fair, MD;  Location: [facility];  Service: Endoscopy;  Laterality: N/A;   CORONARY ANGIOPLASTY WITH STENT PLACEMENT  05/11/2018   CORONARY ANGIOPLASTY WITH STENT PLACEMENT  1997   CORONARY ARTERY BYPASS GRAFT  ~ 1997   CABG X2   CORONARY STENT INTERVENTION N/A 05/11/2018   Procedure: CORONARY STENT INTERVENTION;  Surgeon: Jettie Booze, MD;  Location: [facility];  Service: Cardiovascular;  Laterality: N/A;   ESOPHAGOGASTRODUODENOSCOPY N/A 02/02/2013   Procedure: ESOPHAGOGASTRODUODENOSCOPY (EGD);  Surgeon: Wonda Horner, MD;  Location: [facility];  Service: Endoscopy;  Laterality: N/A;   LEFT HEART CATH AND CORS/GRAFTS ANGIOGRAPHY N/A 05/11/2018   Procedure: LEFT HEART CATH AND CORS/GRAFTS ANGIOGRAPHY;  Surgeon: Jettie Booze, MD;  Location: [facility];  Service: Cardiovascular;  Laterality: N/A;   LEFT HEART CATH AND CORS/GRAFTS ANGIOGRAPHY N/A 05/14/2018   Procedure: LEFT HEART CATH AND CORS/GRAFTS ANGIOGRAPHY;  Surgeon: Jettie Booze, MD;  Location: [facility];  Service: Cardiovascular;  Laterality: N/A;   PERIPHERAL VASCULAR ATHERECTOMY Left 02/08/2020   Procedure: PERIPHERAL VASCULAR ATHERECTOMY;  Surgeon: Wellington Hampshire, MD;  Location: [facility];  Service: Cardiovascular;  Laterality: Left;  sfa (distal)   PERIPHERAL VASCULAR BALLOON ANGIOPLASTY Left 02/08/2020   Procedure:  PERIPHERAL VASCULAR BALLOON ANGIOPLASTY;  Surgeon: Wellington Hampshire, MD;  Location: [facility];  Service: Cardiovascular;  Laterality: Left;  sfa (distal)   SKIN LESION EXCISION Left 1960's   "upper arm; sore that never healed" (02/01/2013)   TONSILLECTOMY  1940's    FAMILY HISTORY Family History  Problem Relation Age of Onset   CAD Mother    Hypertension Mother    Asthma Mother    Heart attack Father    Hypertension Father    Hypertension Maternal Grandmother    Hypertension Maternal Grandfather    Breast cancer Paternal Aunt    Stroke Neg Hx     SOCIAL HISTORY Social History   Tobacco Use   Smoking status: Never   Smokeless tobacco: Never   Tobacco comments:    Exposure through parents.  Vaping Use   Vaping Use: Never used  Substance Use Topics   Alcohol use: Yes    Alcohol/week: 0.0 standard drinks of alcohol    Comment: 05/11/2018  "glass  of wine maybe once/month"   Drug use: Never         OPHTHALMIC EXAM:  Base Eye Exam     Visual Acuity (ETDRS)       Right Left   Dist [place] 20/20 -2 20/20 +2         Tonometry (Tonopen, 3:37 PM)       Right Left   Pressure 5 8         Pupils       Pupils Dark Light APD   Right PERRL 4 3 None   Left PERRL 4 3 None         Extraocular Movement       Right Left    Full Full         Neuro/Psych     Oriented x3: Yes   Mood/Affect: Normal         Dilation     Both eyes: 1.0% Mydriacyl, 2.5% Phenylephrine @ 3:37 PM           Slit Lamp and Fundus Exam     External Exam       Right Left   External Normal Normal         Slit Lamp Exam       Right Left   Lids/Lashes Normal Normal   Conjunctiva/Sclera White and quiet White and quiet   Cornea Clear Clear   Anterior Chamber Deep and quiet Deep and quiet   Iris Round and reactive Round and reactive   Lens Posterior chamber intraocular lens Centered posterior chamber intraocular lens   Anterior Vitreous Normal Normal          Fundus Exam       Right Left   Posterior Vitreous Normal Normal   Disc Normal Normal   C/D Ratio 0.55 0.45   Macula Hard drusen, Early age related macular degeneration Macular thickening, no exudates, no hemorrhage, Choroidal neovascular membrane with PED, Retinal pigment epithelial detachment   Vessels Normal Normal   Periphery Normal Normal            IMAGING AND PROCEDURES  Imaging and Procedures for 12/30/21  OCT, Retina - OU - Both Eyes       Right Eye Quality was good. Scan locations included subfoveal. Central Foveal Thickness: 251. Progression has been stable. Findings include normal foveal contour, retinal drusen .   Left Eye Quality was good. Scan locations included subfoveal. Central Foveal Thickness: 360. Progression has improved. Findings include abnormal foveal contour, retinal drusen , pigment epithelial detachment, subretinal fluid, vitreous traction, vitreomacular adhesion .   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  We will need to repeat injection Eylea OS today and reevaluate in 5 weeks due to disease activity  OD no sign of CNVM      Color Fundus Photography Optos - OU - Both Eyes       Right Eye Progression has no prior data. Disc findings include normal observations. Macula : normal observations, drusen. Vessels : normal observations. Periphery : normal observations.   Left Eye Progression has no prior data. Macula : drusen, edema, retinal pigment epithelium abnormalities. Vessels : normal observations. Periphery : normal observations.   Notes Subfoveal pigment epithelial detachment macular thickening left eye, no obvious clinical hemorrhage OS      Intravitreal Injection, Pharmacologic Agent - OS - Left Eye  Time Out 12/30/2021. 4:22 PM. Confirmed correct patient,  procedure, site, and patient consented.   Anesthesia Subconjunctival anesthesia was used. Anesthetic medications included Lidocaine 4%.   Procedure Preparation included 5% betadine to ocular surface, 10% betadine to eyelids, Tobramycin 0.3%. A 30 gauge needle was used.   Injection: 2 mg aflibercept 2 MG/0.05ML   Route: Intravitreal, Site: Left Eye   NDC: A3590391, Lot: [id number], Waste: 0 mL   Post-op Post injection exam found visual acuity of at least counting fingers. The patient tolerated the procedure well. There were no complications. The patient received written and verbal post procedure care education. Post injection medications were not given.      Fluorescein Angiography Optos (Transit OS)       Injection: 500 mg Fluorescein Sodium 10 %   Route: Intravenous   NDC: [phone]   Right Eye   Progression has no prior data. Mid/Late phase findings include window defect. Choroidal neovascularization is not present.   Left Eye   Progression has no prior data. Early phase findings include window defect. Mid/Late phase findings include leakage, window defect, choroidal neovascularization. Choroidal neovascularization is occult.              ASSESSMENT/PLAN:  Vitreomacular adhesion of left eye Partial PVD OS, there is still strands over the apex of the PED.  Intermediate stage nonexudative age-related macular degeneration of both eyes No sign of CNVM OD  Exudative age-related macular degeneration of left eye with active choroidal neovascularization (HCC) OS stabilized findings with macular subfoveal PED/CNVM.  Repeat injection Eylea today may consider extension of treatment interval next     ICD-10-CM   1. Exudative age-related macular degeneration of left eye with active choroidal neovascularization (HCC)  H35.3221 OCT, Retina - OU - Both Eyes    Color Fundus Photography Optos - OU - Both Eyes    Intravitreal Injection, Pharmacologic Agent - OS - Left  Eye    Fluorescein Angiography Optos (Transit OS)    aflibercept (EYLEA) SOLN 2 mg    Fluorescein Sodium 10 % injection 500 mg    2. Vitreomacular adhesion of left eye  H43.822     3. Intermediate stage nonexudative age-related macular degeneration of both eyes  H35.3132       1.  OS, active subfoveal PED, with CNVM.  Repeat Eylea today and extend interval examination next 6 weeks  2.  Evaluate OS confirms CNVM also confirms inactive ARMD OD.  No sign of CNVM OD  3.  Ophthalmic Meds Ordered this visit:  Meds ordered this encounter  Medications   aflibercept (EYLEA) SOLN 2 mg   Fluorescein Sodium 10 % injection 500 mg       Return in about 6 weeks (around 02/10/2022) for dilate, OS, EYLEA OCT.  There are no Patient Instructions on file for this visit.   Explained the diagnoses, plan, and follow up with the patient and they expressed understanding.  Patient expressed understanding of the importance of proper follow up care.   Clent Demark Fredi Hurtado M.D. Diseases & Surgery of the Retina and Vitreous [facility] [place] 12/30/21     Abbreviations: M myopia (nearsighted); A astigmatism; H hyperopia (farsighted); P presbyopia; Mrx spectacle prescription;  CTL contact lenses; OD right eye; OS left eye; OU both eyes  XT exotropia; ET esotropia; PEK punctate epithelial keratitis; PEE punctate epithelial erosions; DES dry eye syndrome; MGD meibomian gland dysfunction; ATs artificial tears; PFAT's preservative free artificial tears; [place] nuclear sclerotic cataract; PSC posterior subcapsular  cataract; ERM epi-retinal membrane; PVD posterior vitreous detachment; RD retinal detachment; DM diabetes mellitus; DR diabetic retinopathy; NPDR non-proliferative diabetic retinopathy; PDR proliferative diabetic retinopathy; CSME clinically significant macular edema; DME diabetic macular edema; dbh dot blot hemorrhages; CWS cotton wool spot; POAG primary open angle glaucoma; C/D cup-to-disc ratio;  HVF humphrey visual field; GVF goldmann visual field; OCT optical coherence tomography; IOP intraocular pressure; BRVO Branch retinal vein occlusion; CRVO central retinal vein occlusion; CRAO central retinal artery occlusion; BRAO branch retinal artery occlusion; RT retinal tear; SB scleral buckle; PPV pars plana vitrectomy; VH Vitreous hemorrhage; PRP panretinal laser photocoagulation; IVK intravitreal kenalog; VMT vitreomacular traction; MH Macular hole;  NVD neovascularization of the disc; NVE neovascularization elsewhere; AREDS age related eye disease study; ARMD age related macular degeneration; POAG primary open angle glaucoma; EBMD epithelial/anterior basement membrane dystrophy; ACIOL anterior chamber intraocular lens; IOL intraocular lens; PCIOL posterior chamber intraocular lens; Phaco/IOL phacoemulsification with intraocular lens placement; [place] photorefractive keratectomy; LASIK laser assisted in situ keratomileusis; HTN hypertension; DM diabetes mellitus; COPD chronic obstructive pulmonary disease

## 2021-12-30 NOTE — Assessment & Plan Note (Addendum)
Partial PVD OS, there is still strands over the apex of the PED.

## 2022-01-09 DIAGNOSIS — Z0100 Encounter for examination of eyes and vision without abnormal findings: Secondary | ICD-10-CM | POA: Diagnosis not present

## 2022-02-07 NOTE — Progress Notes (Unsigned)
Cardiology Office Note   Date:  02/10/2022   ID:  Melissa Elliott, DOB 1940-07-18, MRN [id number]  PCP:  Wenda Low, MD    No chief complaint on file.  CAD  Wt Readings from Last 3 Encounters:  02/10/22 124 lb (56.2 kg)  12/24/21 121 lb 9.6 oz (55.2 kg)  12/11/21 123 lb (55.8 kg)       History of Present Illness: Melissa Elliott is a 81 y.o. female   with CAD. CABG x 2 in 1996 @ [facility] with Dr. Roderick Pee.    She had several stomach ulcers several years ago; likely secondary to meloxicam.  Not taking NSAIDs now.   She had outpatient cardiac catheterization which was done on 05/11/2018 which showed a patent LIMA to LAD, occluded SVG to OM.  CTO of mid RCA with distal collaterals.  Patient had a significant bifurcation lesion at obtuse marginal branch treated with drug-eluting stent x2.  Postprocedure, patient was placed on aspirin and Plavix.  RCA was managed medically.  She was discharged on 05/12/2018.  She initially felt well, however by the following day, she started having significant fatigue and intermittent left-sided chest pain up to 1 to 2 minutes each.  She did not have any chest pain prior to the recent cardiac catheterization and this was something new.  She presented to [facility] ED 05/13/18.  EKG did not show significant ST changes, did show some nonspecific T wave changes.  Initial point-of-care troponin is 2.66 and trended down.   Repeat cath showed widely patent culotte stents in the OM territory.  It was felt that the elevated troponin was likely from the complex bifurcation intervention done several days prior.   She was seen back for post hospital f/u on 05/20/18 and noted symptoms of dizziness and hear syncope. Worse with positional changes. Her BP was low, in the low 094B systolic. Orthostatic VS were +. Her irbesartan was reduced down from 300 mg daily to 150 mg daily. She was instructed to monitor BP at home and to  return in 2 weeks to reassess symptoms and BP. Sx improved.   She had a precancerous lesion removed from the left leg.   In 2020, she noted a burning sensation in her left calf with walking. Initially it started after 0.25 mile, now it is at 0.5 miles where it starts.  No nonhealing sores.  Not related to the skin procedure.   Records from Dr. Fletcher Anon show: "orbital atherectomy and drug-coated balloon angioplasty to the distal left SFA. She had some discomfort in the right groin access site.  Ultrasound showed no evidence of pseudoaneurysm but there was small hematoma.  Postprocedure vascular studies showed improvement in ABI to normal on the left.  Duplex showed patent distal left SFA.  There was moderately increased velocity in the mid SFA."   She had has mild claudication, stops occasionally. Rare shoulder aching- not related to activity.  Denies : Chest pain. Dizziness. Leg edema. Nitroglycerin use. Orthopnea. Palpitations. Paroxysmal nocturnal dyspnea. Shortness of breath. Syncope.      Past Medical History:  Diagnosis Date   Asthma    Complication of anesthesia    "takes a long time to wakeup; sometimes days" (05/11/2018)   Coronary artery disease    H/O hiatal hernia    Heart murmur    High cholesterol    "on RX because of the bypass" (02/01/2013)   History of duodenal ulcer  Hot flashes    "on a regular basis" (05/11/2018)   Hypertension    Hypoglycemia    "that's something I have to watch constantly" (02/01/2013)   Migraine    "probably stopped w/menses" (05/11/2018)   Pneumonia ? 1970's   "once" (05/11/2018)   Sciatic nerve pain 12/2012   "RLE; just had it once" (05/11/2018)    Past Surgical History:  Procedure Laterality Date   ABDOMINAL AORTOGRAM W/LOWER EXTREMITY N/A 02/08/2020   Procedure: ABDOMINAL AORTOGRAM W/LOWER EXTREMITY;  Surgeon: Wellington Hampshire, MD;  Location: [place] [facility];  Service: Cardiovascular;  Laterality: N/A;   APPENDECTOMY  1970    BREAST BIOPSY Left    benign   COLONOSCOPY WITH PROPOFOL N/A 06/19/2015   Procedure: COLONOSCOPY WITH PROPOFOL;  Surgeon: Garlan Fair, MD;  Location: [facility];  Service: Endoscopy;  Laterality: N/A;   CORONARY ANGIOPLASTY WITH STENT PLACEMENT  05/11/2018   CORONARY ANGIOPLASTY WITH STENT PLACEMENT  1997   CORONARY ARTERY BYPASS GRAFT  ~ 1997   CABG X2   CORONARY STENT INTERVENTION N/A 05/11/2018   Procedure: CORONARY STENT INTERVENTION;  Surgeon: Jettie Booze, MD;  Location: [place] [facility];  Service: Cardiovascular;  Laterality: N/A;   ESOPHAGOGASTRODUODENOSCOPY N/A 02/02/2013   Procedure: ESOPHAGOGASTRODUODENOSCOPY (EGD);  Surgeon: Wonda Horner, MD;  Location: [facility];  Service: Endoscopy;  Laterality: N/A;   LEFT HEART CATH AND CORS/GRAFTS ANGIOGRAPHY N/A 05/11/2018   Procedure: LEFT HEART CATH AND CORS/GRAFTS ANGIOGRAPHY;  Surgeon: Jettie Booze, MD;  Location: [place] [facility];  Service: Cardiovascular;  Laterality: N/A;   LEFT HEART CATH AND CORS/GRAFTS ANGIOGRAPHY N/A 05/14/2018   Procedure: LEFT HEART CATH AND CORS/GRAFTS ANGIOGRAPHY;  Surgeon: Jettie Booze, MD;  Location: [place] [facility];  Service: Cardiovascular;  Laterality: N/A;   PERIPHERAL VASCULAR ATHERECTOMY Left 02/08/2020   Procedure: PERIPHERAL VASCULAR ATHERECTOMY;  Surgeon: Wellington Hampshire, MD;  Location: [place] [facility];  Service: Cardiovascular;  Laterality: Left;  sfa (distal)   PERIPHERAL VASCULAR BALLOON ANGIOPLASTY Left 02/08/2020   Procedure: PERIPHERAL VASCULAR BALLOON ANGIOPLASTY;  Surgeon: Wellington Hampshire, MD;  Location: [place] [facility];  Service: Cardiovascular;  Laterality: Left;  sfa (distal)   SKIN LESION EXCISION Left 1960's   "upper arm; sore that never healed" (02/01/2013)   TONSILLECTOMY  1940's     Current Outpatient Medications  Medication Sig Dispense Refill   ADVAIR DISKUS 250-50 MCG/DOSE AEPB Inhale 1 puff into the lungs 2 (two) times daily. Only  uses in winter and spring months  11   albuterol (PROVENTIL HFA;VENTOLIN HFA) 108 (90 Base) MCG/ACT inhaler Inhale 2 puffs into the lungs every 6 (six) hours as needed for wheezing. 1 Inhaler 3   aspirin 81 MG EC tablet Take 81 mg by mouth daily. Swallow whole.     Calcium Carbonate-Vitamin D (CALCIUM-D) 600-400 MG-UNIT TABS Take 1 tablet by mouth daily in the afternoon.      cholecalciferol (VITAMIN D) 1000 units tablet Take 1,000 Units by mouth daily with lunch.     clopidogrel (PLAVIX) 75 MG tablet Take 1 tablet (75 mg total) by mouth daily. 30 tablet 2   fexofenadine (ALLEGRA) 180 MG tablet Take 180 mg by mouth daily.     irbesartan (AVAPRO) 150 MG tablet TAKE 1 TABLET(150 MG) BY MOUTH DAILY 90 tablet 2   metFORMIN (GLUCOPHAGE) 500 MG tablet Take 500 mg by mouth daily. with food  1   metoprolol succinate (TOPROL-XL) 50 MG 24  hr tablet Take 50 mg by mouth daily.      montelukast (SINGULAIR) 10 MG tablet Take 10 mg by mouth at bedtime.     Multiple Vitamins-Minerals (PRESERVISION AREDS 2) CAPS Take 1 capsule by mouth 2 (two) times daily.     nitroGLYCERIN (NITROSTAT) 0.4 MG SL tablet Place 0.4 mg under the tongue every 5 (five) minutes as needed for chest pain (UP TO 3 DOSES BEFORE CALLING EMS).     simvastatin (ZOCOR) 40 MG tablet Take 40 mg by mouth at bedtime.      No current facility-administered medications for this visit.    Allergies:   Ciprofloxacin, Sulfa antibiotics, Iodine, Eggs or egg-derived products, and Penicillins    Social History:  The patient  reports that she has never smoked. She has never used smokeless tobacco. She reports current alcohol use. She reports that she does not use drugs.   Family History:  The patient's family history includes Asthma in her mother; Breast cancer in her paternal aunt; CAD in her mother; Heart attack in her father; Hypertension in her father, maternal grandfather, maternal grandmother, and mother.    ROS:  Please see the history of  present illness.   Otherwise, review of systems are positive for mild claudication.   All other systems are reviewed and negative.    PHYSICAL EXAM: VS:  BP 140/64   Pulse 85   Ht 5' 3.5" (1.613 m)   Wt 124 lb (56.2 kg)   SpO2 98%   BMI 21.62 kg/m  , BMI Body mass index is 21.62 kg/m. GEN: Well nourished, well developed, in no acute distress HEENT: normal Neck: no JVD, carotid bruits, or masses Cardiac: RRR; no murmurs, rubs, or gallops,no edema  Respiratory:  clear to auscultation bilaterally, normal work of breathing GI: soft, nontender, nondistended, + BS MS: no deformity or atrophy; 1+ DP pulses bilaterally Skin: warm and dry, no rash Neuro:  Strength and sensation are intact Psych: euthymic mood, full affect   Recent Labs: No results found for requested labs within last 365 days.   Lipid Panel    Component Value Date/Time   CHOL 157 05/14/2018 0203   TRIG 120 05/14/2018 0203   HDL 52 05/14/2018 0203   CHOLHDL 3.0 05/14/2018 0203   VLDL 24 05/14/2018 0203   LDLCALC 81 05/14/2018 0203     Other studies Reviewed: Additional studies/ records that were reviewed today with results demonstrating: LDL 71, HDL 50 TC 152.   ASSESSMENT AND PLAN:  CAD: Continue aggressive secondary prevention.  No bleeding on aspirin and Plavix.  No clear angina.  She continues to be active.  Continue healthy diet. PAD: prior left SFA intervention.  Follows with Dr. Fletcher Anon. No Cuts or sores on the feet that do not heal.  No critical limb HTN: Low-salt diet. Home readings are variable.  Repeat blood pressure today 140/64.  Continue to monitor at home. Hyperlipidemia: Whole food, plant-based diet recommended.  Avoid processed foods.  Stop simvastatin.  Start rosuvastatin 20 mg daily.  High potency statin indicated given her PAD and coronary artery disease.  We will check liver and lipid tests in about 3 months.   Current medicines are reviewed at length with the patient today.  The patient  concerns regarding her medicines were addressed.  The following changes have been made:  No change  Labs/ tests ordered today include:  No orders of the defined types were placed in this encounter.   Recommend 150 minutes/week of aerobic  exercise Low fat, low carb, high fiber diet recommended  Disposition:   FU in 1 year, also follows with Dr. Fletcher Anon.   Signed, Larae Grooms, MD  02/10/2022 10:33 AM    [facility] [address], [place], [place]  [address] Phone: [phone]; Fax: [phone]

## 2022-02-10 ENCOUNTER — Ambulatory Visit: Payer: Medicare HMO | Admitting: Interventional Cardiology

## 2022-02-10 ENCOUNTER — Encounter: Payer: Self-pay | Admitting: Interventional Cardiology

## 2022-02-10 VITALS — BP 140/64 | HR 85 | Ht 63.5 in | Wt 124.0 lb

## 2022-02-10 DIAGNOSIS — I10 Essential (primary) hypertension: Secondary | ICD-10-CM

## 2022-02-10 DIAGNOSIS — R7303 Prediabetes: Secondary | ICD-10-CM

## 2022-02-10 DIAGNOSIS — E782 Mixed hyperlipidemia: Secondary | ICD-10-CM

## 2022-02-10 DIAGNOSIS — I252 Old myocardial infarction: Secondary | ICD-10-CM

## 2022-02-10 DIAGNOSIS — I739 Peripheral vascular disease, unspecified: Secondary | ICD-10-CM | POA: Diagnosis not present

## 2022-02-10 DIAGNOSIS — I2510 Atherosclerotic heart disease of native coronary artery without angina pectoris: Secondary | ICD-10-CM | POA: Diagnosis not present

## 2022-02-10 MED ORDER — ROSUVASTATIN CALCIUM 20 MG PO TABS: 20.0000 mg | ORAL_TABLET | Freq: Every day | ORAL | 3 refills | Status: DC

## 2022-02-10 NOTE — Patient Instructions (Signed)
Medication Instructions:  Your physician has recommended you make the following change in your medication: Stop Simvastatin. Start Rosuvastatin 20 mg by mouth daily  *If you need a refill on your cardiac medications before your next appointment, please call your pharmacy*   Lab Work: Your physician recommends that you return for lab work on May 07, 2023.  Lipid and liver profiles.  This will be fasting.  The lab opens at 7:15 AM  If you have labs (blood work) drawn today and your tests are completely normal, you will receive your results only by: Will (if you have MyChart) OR A paper copy in the mail If you have any lab test that is abnormal or we need to change your treatment, we will call you to review the results.   Testing/Procedures: none   Follow-Up: At [facility], you and your health needs are our priority.  As part of our continuing mission to provide you with exceptional heart care, we have created designated Provider Care Teams.  These Care Teams include your primary Cardiologist (physician) and Advanced Practice Providers (APPs -  Physician Assistants and Nurse Practitioners) who all work together to provide you with the care you need, when you need it.  We recommend signing up for the patient portal called "MyChart".  Sign up information is provided on this After Visit Summary.  MyChart is used to connect with patients for Virtual Visits (Telemedicine).  Patients are able to view lab/test results, encounter notes, upcoming appointments, etc.  Non-urgent messages can be sent to your provider as well.   To learn more about what you can do with MyChart, go to [email or web address].    Your next appointment:   12 month(s)  The format for your next appointment:   In Person  Provider:   Larae Grooms, MD     Other Instructions   Important Information About Sugar

## 2022-02-11 ENCOUNTER — Ambulatory Visit (INDEPENDENT_AMBULATORY_CARE_PROVIDER_SITE_OTHER): Payer: Medicare HMO | Admitting: Ophthalmology

## 2022-02-11 ENCOUNTER — Other Ambulatory Visit: Payer: Self-pay | Admitting: Interventional Cardiology

## 2022-02-11 ENCOUNTER — Encounter (INDEPENDENT_AMBULATORY_CARE_PROVIDER_SITE_OTHER): Payer: Self-pay | Admitting: Ophthalmology

## 2022-02-11 DIAGNOSIS — H353221 Exudative age-related macular degeneration, left eye, with active choroidal neovascularization: Secondary | ICD-10-CM | POA: Diagnosis not present

## 2022-02-11 MED ORDER — AFLIBERCEPT 2MG/0.05ML IZ SOLN FOR KALEIDOSCOPE
2.0000 mg | INTRAVITREAL | Status: AC | PRN
  Administered 2022-02-11: 2 mg via INTRAVITREAL

## 2022-02-11 NOTE — Progress Notes (Signed)
02/11/2022     CHIEF COMPLAINT Patient presents for  Chief Complaint  Patient presents with   Macular Degeneration      HISTORY OF PRESENT ILLNESS: Melissa Elliott is a 81 y.o. female who presents to the clinic today for:   HPI   6 weeks for DILATE OS, EYLEA, OCT. Pt stated no changes in vision since last visit. Pt has allergy to CIPRO.  Last edited by Silvestre Moment on 02/11/2022  2:08 PM.      Referring physician: Wenda Low, MD [address] [place],  [place] [address]  HISTORICAL INFORMATION:   Selected notes from the [facility]: No current outpatient medications on file. (Ophthalmic Drugs)   No current facility-administered medications for this visit. (Ophthalmic Drugs)   Current Outpatient Medications (Other)  Medication Sig   ADVAIR DISKUS 250-50 MCG/DOSE AEPB Inhale 1 puff into the lungs 2 (two) times daily. Only uses in winter and spring months   albuterol (PROVENTIL HFA;VENTOLIN HFA) 108 (90 Base) MCG/ACT inhaler Inhale 2 puffs into the lungs every 6 (six) hours as needed for wheezing.   aspirin 81 MG EC tablet Take 81 mg by mouth daily. Swallow whole.   Calcium Carbonate-Vitamin D (CALCIUM-D) 600-400 MG-UNIT TABS Take 1 tablet by mouth daily in the afternoon.    cholecalciferol (VITAMIN D) 1000 units tablet Take 1,000 Units by mouth daily with lunch.   clopidogrel (PLAVIX) 75 MG tablet TAKE 1 TABLET BY MOUTH EVERY DAY   fexofenadine (ALLEGRA) 180 MG tablet Take 180 mg by mouth daily.   irbesartan (AVAPRO) 150 MG tablet TAKE 1 TABLET(150 MG) BY MOUTH DAILY   metFORMIN (GLUCOPHAGE) 500 MG tablet Take 500 mg by mouth daily. with food   metoprolol succinate (TOPROL-XL) 50 MG 24 hr tablet Take 50 mg by mouth daily.    montelukast (SINGULAIR) 10 MG tablet Take 10 mg by mouth at bedtime.   Multiple Vitamins-Minerals (PRESERVISION AREDS 2) CAPS Take 1 capsule by mouth 2 (two) times daily.   nitroGLYCERIN  (NITROSTAT) 0.4 MG SL tablet Place 0.4 mg under the tongue every 5 (five) minutes as needed for chest pain (UP TO 3 DOSES BEFORE CALLING EMS).   rosuvastatin (CRESTOR) 20 MG tablet Take 1 tablet (20 mg total) by mouth daily.   No current facility-administered medications for this visit. (Other)      REVIEW OF SYSTEMS: ROS   Negative for: Constitutional, Gastrointestinal, Neurological, Skin, Genitourinary, Musculoskeletal, HENT, Endocrine, Cardiovascular, Eyes, Respiratory, Psychiatric, Allergic/Imm, Heme/Lymph Last edited by Silvestre Moment on 02/11/2022  2:07 PM.       ALLERGIES Allergies  Allergen Reactions   Ciprofloxacin Nausea And Vomiting    Severe "dry heaves"   Sulfa Antibiotics Swelling    Swelling in face, neck, arms, hands.    Iodine Hives   Eggs Or Egg-Derived Products Nausea And Vomiting    "sometimes I can eat eggs; sometimes I can't"   Penicillins     As a child Has patient had a PCN reaction causing immediate rash, facial/tongue/throat swelling, SOB or lightheadedness with hypotension: Unknown Has patient had a PCN reaction causing severe rash involving mucus membranes or skin necrosis: Unknown Has patient had a PCN reaction that required hospitalization: Unknown Has patient had a PCN reaction occurring within the last 10 years: Unknown If all of the above answers are "NO", then may proceed with Cephalosporin use.     PAST MEDICAL HISTORY Past Medical  History:  Diagnosis Date   Asthma    Complication of anesthesia    "takes a long time to wakeup; sometimes days" (05/11/2018)   Coronary artery disease    H/O hiatal hernia    Heart murmur    High cholesterol    "on RX because of the bypass" (02/01/2013)   History of duodenal ulcer    Hot flashes    "on a regular basis" (05/11/2018)   Hypertension    Hypoglycemia    "that's something I have to watch constantly" (02/01/2013)   Migraine    "probably stopped w/menses" (05/11/2018)   Pneumonia ? 1970's   "once"  (05/11/2018)   Sciatic nerve pain 12/2012   "RLE; just had it once" (05/11/2018)   Past Surgical History:  Procedure Laterality Date   ABDOMINAL AORTOGRAM W/LOWER EXTREMITY N/A 02/08/2020   Procedure: ABDOMINAL AORTOGRAM W/LOWER EXTREMITY;  Surgeon: Wellington Hampshire, MD;  Location: [facility];  Service: Cardiovascular;  Laterality: N/A;   APPENDECTOMY  1970   BREAST BIOPSY Left    benign   COLONOSCOPY WITH PROPOFOL N/A 06/19/2015   Procedure: COLONOSCOPY WITH PROPOFOL;  Surgeon: Garlan Fair, MD;  Location: [facility];  Service: Endoscopy;  Laterality: N/A;   CORONARY ANGIOPLASTY WITH STENT PLACEMENT  05/11/2018   CORONARY ANGIOPLASTY WITH STENT PLACEMENT  1997   CORONARY ARTERY BYPASS GRAFT  ~ 1997   CABG X2   CORONARY STENT INTERVENTION N/A 05/11/2018   Procedure: CORONARY STENT INTERVENTION;  Surgeon: Jettie Booze, MD;  Location: [facility];  Service: Cardiovascular;  Laterality: N/A;   ESOPHAGOGASTRODUODENOSCOPY N/A 02/02/2013   Procedure: ESOPHAGOGASTRODUODENOSCOPY (EGD);  Surgeon: Wonda Horner, MD;  Location: [facility];  Service: Endoscopy;  Laterality: N/A;   LEFT HEART CATH AND CORS/GRAFTS ANGIOGRAPHY N/A 05/11/2018   Procedure: LEFT HEART CATH AND CORS/GRAFTS ANGIOGRAPHY;  Surgeon: Jettie Booze, MD;  Location: [facility];  Service: Cardiovascular;  Laterality: N/A;   LEFT HEART CATH AND CORS/GRAFTS ANGIOGRAPHY N/A 05/14/2018   Procedure: LEFT HEART CATH AND CORS/GRAFTS ANGIOGRAPHY;  Surgeon: Jettie Booze, MD;  Location: [facility];  Service: Cardiovascular;  Laterality: N/A;   PERIPHERAL VASCULAR ATHERECTOMY Left 02/08/2020   Procedure: PERIPHERAL VASCULAR ATHERECTOMY;  Surgeon: Wellington Hampshire, MD;  Location: [facility];  Service: Cardiovascular;  Laterality: Left;  sfa (distal)   PERIPHERAL VASCULAR BALLOON ANGIOPLASTY Left 02/08/2020   Procedure: PERIPHERAL VASCULAR BALLOON ANGIOPLASTY;  Surgeon: Wellington Hampshire, MD;  Location: [facility];  Service: Cardiovascular;  Laterality: Left;  sfa (distal)   SKIN LESION EXCISION Left 1960's   "upper arm; sore that never healed" (02/01/2013)   TONSILLECTOMY  1940's    FAMILY HISTORY Family History  Problem Relation Age of Onset   CAD Mother    Hypertension Mother    Asthma Mother    Heart attack Father    Hypertension Father    Hypertension Maternal Grandmother    Hypertension Maternal Grandfather    Breast cancer Paternal Aunt    Stroke Neg Hx     SOCIAL HISTORY Social History   Tobacco Use   Smoking status: Never   Smokeless tobacco: Never   Tobacco comments:    Exposure through parents.  Vaping Use   Vaping Use: Never used  Substance Use Topics   Alcohol use: Yes    Alcohol/week: 0.0 standard drinks of alcohol    Comment: 05/11/2018  "glass of wine maybe once/month"  Drug use: Never         OPHTHALMIC EXAM:  Base Eye Exam     Visual Acuity (ETDRS)       Right Left   Dist [place] 20/25 -2 +2 20/20 -1         Tonometry (Tonopen, 2:12 PM)       Right Left   Pressure 13 13         Pupils       Pupils APD   Right PERRL None   Left PERRL          Visual Fields       Left Right    Full Full         Extraocular Movement       Right Left    Full Full         Neuro/Psych     Oriented x3: Yes   Mood/Affect: Normal         Dilation     Left eye: 2.5% Phenylephrine, 1.0% Mydriacyl @ 2:11 PM           Slit Lamp and Fundus Exam     External Exam       Right Left   External Normal Normal         Slit Lamp Exam       Right Left   Lids/Lashes Normal Normal   Conjunctiva/Sclera White and quiet White and quiet   Cornea Clear Clear   Anterior Chamber Deep and quiet Deep and quiet   Iris Round and reactive Round and reactive   Lens Posterior chamber intraocular lens Centered posterior chamber intraocular lens   Anterior Vitreous Normal Normal         Fundus Exam        Right Left   Posterior Vitreous  Normal   Disc  Normal   C/D Ratio  0.45   Macula  Macular thickening, no exudates, no hemorrhage, Choroidal neovascular membrane with PED, Retinal pigment epithelial detachment   Vessels  Normal   Periphery  Normal            IMAGING AND PROCEDURES  Imaging and Procedures for 02/11/22  OCT, Retina - OU - Both Eyes       Right Eye Quality was good. Scan locations included subfoveal. Central Foveal Thickness: 257. Progression has been stable. Findings include normal foveal contour, retinal drusen .   Left Eye Quality was good. Scan locations included subfoveal. Central Foveal Thickness: 364. Progression has improved. Findings include abnormal foveal contour, retinal drusen , pigment epithelial detachment, subretinal fluid, vitreous traction, vitreomacular adhesion .   Notes Recently discovered vascularized pigment epithelial detachment, wet AMD, with subretinal fluid and significant foveal elevation, OS, slightly worse thickening, persistence of subretinal fluid  We will need to repeat injection Eylea OS today and reevaluate in 5 weeks due to disease activity  OD no sign of CNVM     Intravitreal Injection, Pharmacologic Agent - OS - Left Eye       Time Out 02/11/2022. 2:44 PM. Confirmed correct patient, procedure, site, and patient consented.   Anesthesia Subconjunctival anesthesia was used. Anesthetic medications included Lidocaine 4%.   Procedure Preparation included 5% betadine to ocular surface, 10% betadine to eyelids, Tobramycin 0.3%. A 30 gauge needle was used.   Injection: 2 mg aflibercept 2 MG/0.05ML   Route: Intravitreal, Site: Left Eye   NDC: [id number], Lot: [id number], Expiration date: 07/14/2022, Waste: 0 mL   Post-op Post injection exam  found visual acuity of at least counting fingers. The patient tolerated the procedure well. There were no complications. The patient received written and verbal post procedure care  education. Post injection medications were not given.              ASSESSMENT/PLAN:  Exudative age-related macular degeneration of left eye with active choroidal neovascularization (HCC) OS still active with subretinal fluid overlying pigment epithelial detachment yet with preserved acuity.  We will repeat injection today at 6-week and we will try to maintain 5 to 6-week interval evaluation to prevent vision loss      ICD-10-CM   1. Exudative age-related macular degeneration of left eye with active choroidal neovascularization (HCC)  H35.3221 OCT, Retina - OU - Both Eyes    Intravitreal Injection, Pharmacologic Agent - OS - Left Eye    aflibercept (EYLEA) SOLN 2 mg      1.  2.  3.  Ophthalmic Meds Ordered this visit:  Meds ordered this encounter  Medications   aflibercept (EYLEA) SOLN 2 mg       No follow-ups on file.  There are no Patient Instructions on file for this visit.   Explained the diagnoses, plan, and follow up with the patient and they expressed understanding.  Patient expressed understanding of the importance of proper follow up care.   Clent Demark Miaya Lafontant M.D. Diseases & Surgery of the Retina and Vitreous [facility] 02/11/22     Abbreviations: M myopia (nearsighted); A astigmatism; H hyperopia (farsighted); P presbyopia; Mrx spectacle prescription;  CTL contact lenses; OD right eye; OS left eye; OU both eyes  XT exotropia; ET esotropia; PEK punctate epithelial keratitis; PEE punctate epithelial erosions; DES dry eye syndrome; MGD meibomian gland dysfunction; ATs artificial tears; PFAT's preservative free artificial tears; [place] nuclear sclerotic cataract; PSC posterior subcapsular cataract; ERM epi-retinal membrane; PVD posterior vitreous detachment; RD retinal detachment; DM diabetes mellitus; DR diabetic retinopathy; NPDR non-proliferative diabetic retinopathy; PDR proliferative diabetic retinopathy; CSME clinically significant macular  edema; DME diabetic macular edema; dbh dot blot hemorrhages; CWS cotton wool spot; POAG primary open angle glaucoma; C/D cup-to-disc ratio; HVF humphrey visual field; GVF goldmann visual field; OCT optical coherence tomography; IOP intraocular pressure; BRVO Branch retinal vein occlusion; CRVO central retinal vein occlusion; CRAO central retinal artery occlusion; BRAO branch retinal artery occlusion; RT retinal tear; SB scleral buckle; PPV pars plana vitrectomy; VH Vitreous hemorrhage; PRP panretinal laser photocoagulation; IVK intravitreal kenalog; VMT vitreomacular traction; MH Macular hole;  NVD neovascularization of the disc; NVE neovascularization elsewhere; AREDS age related eye disease study; ARMD age related macular degeneration; POAG primary open angle glaucoma; EBMD epithelial/anterior basement membrane dystrophy; ACIOL anterior chamber intraocular lens; IOL intraocular lens; PCIOL posterior chamber intraocular lens; Phaco/IOL phacoemulsification with intraocular lens placement; [place] photorefractive keratectomy; LASIK laser assisted in situ keratomileusis; HTN hypertension; DM diabetes mellitus; COPD chronic obstructive pulmonary disease

## 2022-02-11 NOTE — Assessment & Plan Note (Signed)
OS still active with subretinal fluid overlying pigment epithelial detachment yet with preserved acuity.  We will repeat injection today at 6-week and we will try to maintain 5 to 6-week interval evaluation to prevent vision loss

## 2022-02-25 DIAGNOSIS — L578 Other skin changes due to chronic exposure to nonionizing radiation: Secondary | ICD-10-CM | POA: Diagnosis not present

## 2022-02-25 DIAGNOSIS — Z86018 Personal history of other benign neoplasm: Secondary | ICD-10-CM | POA: Diagnosis not present

## 2022-02-25 DIAGNOSIS — L570 Actinic keratosis: Secondary | ICD-10-CM | POA: Diagnosis not present

## 2022-03-18 ENCOUNTER — Ambulatory Visit (INDEPENDENT_AMBULATORY_CARE_PROVIDER_SITE_OTHER): Payer: Medicare HMO | Admitting: Ophthalmology

## 2022-03-18 ENCOUNTER — Encounter (INDEPENDENT_AMBULATORY_CARE_PROVIDER_SITE_OTHER): Payer: Self-pay | Admitting: Ophthalmology

## 2022-03-18 DIAGNOSIS — H353132 Nonexudative age-related macular degeneration, bilateral, intermediate dry stage: Secondary | ICD-10-CM

## 2022-03-18 DIAGNOSIS — H353221 Exudative age-related macular degeneration, left eye, with active choroidal neovascularization: Secondary | ICD-10-CM | POA: Diagnosis not present

## 2022-03-18 MED ORDER — AFLIBERCEPT 2MG/0.05ML IZ SOLN FOR KALEIDOSCOPE
2.0000 mg | INTRAVITREAL | Status: AC | PRN
  Administered 2022-03-18: 2 mg via INTRAVITREAL

## 2022-03-18 NOTE — Assessment & Plan Note (Signed)
No sign of CNVM OD 

## 2022-03-18 NOTE — Progress Notes (Signed)
03/18/2022     CHIEF COMPLAINT Patient presents for  Chief Complaint  Patient presents with   Macular Degeneration      HISTORY OF PRESENT ILLNESS: ALAZAE CRYMES is a 81 y.o. female who presents to the clinic today for:   HPI     5 weeks fu os oct eylea Pt states her vision has been stable Pt denies any new floaters or FOL  Last edited by Hurman Horn, MD on 03/18/2022  4:29 PM.      Referring physician: Wenda Low, MD [address],  [place] [address]  HISTORICAL INFORMATION:   Selected notes from the [facility]: No current outpatient medications on file. (Ophthalmic Drugs)   No current facility-administered medications for this visit. (Ophthalmic Drugs)   Current Outpatient Medications (Other)  Medication Sig   ADVAIR DISKUS 250-50 MCG/DOSE AEPB Inhale 1 puff into the lungs 2 (two) times daily. Only uses in winter and spring months   albuterol (PROVENTIL HFA;VENTOLIN HFA) 108 (90 Base) MCG/ACT inhaler Inhale 2 puffs into the lungs every 6 (six) hours as needed for wheezing.   aspirin 81 MG EC tablet Take 81 mg by mouth daily. Swallow whole.   Calcium Carbonate-Vitamin D (CALCIUM-D) 600-400 MG-UNIT TABS Take 1 tablet by mouth daily in the afternoon.    cholecalciferol (VITAMIN D) 1000 units tablet Take 1,000 Units by mouth daily with lunch.   clopidogrel (PLAVIX) 75 MG tablet TAKE 1 TABLET BY MOUTH EVERY DAY   fexofenadine (ALLEGRA) 180 MG tablet Take 180 mg by mouth daily.   irbesartan (AVAPRO) 150 MG tablet TAKE 1 TABLET(150 MG) BY MOUTH DAILY   metFORMIN (GLUCOPHAGE) 500 MG tablet Take 500 mg by mouth daily. with food   metoprolol succinate (TOPROL-XL) 50 MG 24 hr tablet Take 50 mg by mouth daily.    montelukast (SINGULAIR) 10 MG tablet Take 10 mg by mouth at bedtime.   Multiple Vitamins-Minerals (PRESERVISION AREDS 2) CAPS Take 1 capsule by mouth 2 (two) times daily.   nitroGLYCERIN  (NITROSTAT) 0.4 MG SL tablet Place 0.4 mg under the tongue every 5 (five) minutes as needed for chest pain (UP TO 3 DOSES BEFORE CALLING EMS).   rosuvastatin (CRESTOR) 20 MG tablet Take 1 tablet (20 mg total) by mouth daily.   No current facility-administered medications for this visit. (Other)      REVIEW OF SYSTEMS: ROS   Negative for: Constitutional, Gastrointestinal, Neurological, Skin, Genitourinary, Musculoskeletal, HENT, Endocrine, Cardiovascular, Eyes, Respiratory, Psychiatric, Allergic/Imm, Heme/Lymph Last edited by Morene Rankins, CMA on 03/18/2022  3:26 PM.       ALLERGIES Allergies  Allergen Reactions   Ciprofloxacin Nausea And Vomiting    Severe "dry heaves"   Sulfa Antibiotics Swelling    Swelling in face, neck, arms, hands.    Iodine Hives   Eggs Or Egg-Derived Products Nausea And Vomiting    "sometimes I can eat eggs; sometimes I can't"   Penicillins     As a child Has patient had a PCN reaction causing immediate rash, facial/tongue/throat swelling, SOB or lightheadedness with hypotension: Unknown Has patient had a PCN reaction causing severe rash involving mucus membranes or skin necrosis: Unknown Has patient had a PCN reaction that required hospitalization: Unknown Has patient had a PCN reaction occurring within the last 10 years: Unknown If all of the above answers are "NO", then may proceed with Cephalosporin use.  PAST MEDICAL HISTORY Past Medical History:  Diagnosis Date   Asthma    Complication of anesthesia    "takes a long time to wakeup; sometimes days" (05/11/2018)   Coronary artery disease    H/O hiatal hernia    Heart murmur    High cholesterol    "on RX because of the bypass" (02/01/2013)   History of duodenal ulcer    Hot flashes    "on a regular basis" (05/11/2018)   Hypertension    Hypoglycemia    "that's something I have to watch constantly" (02/01/2013)   Migraine    "probably stopped w/menses" (05/11/2018)   Pneumonia ?  1970's   "once" (05/11/2018)   Sciatic nerve pain 12/2012   "RLE; just had it once" (05/11/2018)   Past Surgical History:  Procedure Laterality Date   ABDOMINAL AORTOGRAM W/LOWER EXTREMITY N/A 02/08/2020   Procedure: ABDOMINAL AORTOGRAM W/LOWER EXTREMITY;  Surgeon: Wellington Hampshire, MD;  Location: [facility];  Service: Cardiovascular;  Laterality: N/A;   APPENDECTOMY  1970   BREAST BIOPSY Left    benign   COLONOSCOPY WITH PROPOFOL N/A 06/19/2015   Procedure: COLONOSCOPY WITH PROPOFOL;  Surgeon: Garlan Fair, MD;  Location: [facility];  Service: Endoscopy;  Laterality: N/A;   CORONARY ANGIOPLASTY WITH STENT PLACEMENT  05/11/2018   CORONARY ANGIOPLASTY WITH STENT PLACEMENT  1997   CORONARY ARTERY BYPASS GRAFT  ~ 1997   CABG X2   CORONARY STENT INTERVENTION N/A 05/11/2018   Procedure: CORONARY STENT INTERVENTION;  Surgeon: Jettie Booze, MD;  Location: [facility];  Service: Cardiovascular;  Laterality: N/A;   ESOPHAGOGASTRODUODENOSCOPY N/A 02/02/2013   Procedure: ESOPHAGOGASTRODUODENOSCOPY (EGD);  Surgeon: Wonda Horner, MD;  Location: [facility];  Service: Endoscopy;  Laterality: N/A;   LEFT HEART CATH AND CORS/GRAFTS ANGIOGRAPHY N/A 05/11/2018   Procedure: LEFT HEART CATH AND CORS/GRAFTS ANGIOGRAPHY;  Surgeon: Jettie Booze, MD;  Location: [facility];  Service: Cardiovascular;  Laterality: N/A;   LEFT HEART CATH AND CORS/GRAFTS ANGIOGRAPHY N/A 05/14/2018   Procedure: LEFT HEART CATH AND CORS/GRAFTS ANGIOGRAPHY;  Surgeon: Jettie Booze, MD;  Location: [facility];  Service: Cardiovascular;  Laterality: N/A;   PERIPHERAL VASCULAR ATHERECTOMY Left 02/08/2020   Procedure: PERIPHERAL VASCULAR ATHERECTOMY;  Surgeon: Wellington Hampshire, MD;  Location: [facility];  Service: Cardiovascular;  Laterality: Left;  sfa (distal)   PERIPHERAL VASCULAR BALLOON ANGIOPLASTY Left 02/08/2020   Procedure: PERIPHERAL VASCULAR BALLOON ANGIOPLASTY;   Surgeon: Wellington Hampshire, MD;  Location: [facility];  Service: Cardiovascular;  Laterality: Left;  sfa (distal)   SKIN LESION EXCISION Left 1960's   "upper arm; sore that never healed" (02/01/2013)   TONSILLECTOMY  1940's    FAMILY HISTORY Family History  Problem Relation Age of Onset   CAD Mother    Hypertension Mother    Asthma Mother    Heart attack Father    Hypertension Father    Hypertension Maternal Grandmother    Hypertension Maternal Grandfather    Breast cancer Paternal Aunt    Stroke Neg Hx     SOCIAL HISTORY Social History   Tobacco Use   Smoking status: Never   Smokeless tobacco: Never   Tobacco comments:    Exposure through parents.  Vaping Use   Vaping Use: Never used  Substance Use Topics   Alcohol use: Yes    Alcohol/week: 0.0 standard drinks of alcohol    Comment: 05/11/2018  "glass  of wine maybe once/month"   Drug use: Never         OPHTHALMIC EXAM:  Base Eye Exam     Visual Acuity (ETDRS)       Right Left   Dist [place] 20/25 +2 20/20 -1         Tonometry (Tonopen, 3:30 PM)       Right Left   Pressure 10 13         Pupils       Pupils   Right PERRL   Left PERRL         Visual Fields       Left Right    Full Full         Extraocular Movement       Right Left    Ortho Ortho    -- -- --  --  --  -- -- --   -- -- --  --  --  -- -- --           Neuro/Psych     Oriented x3: Yes   Mood/Affect: Normal         Dilation     Left eye: 2.5% Phenylephrine, 1.0% Mydriacyl @ 3:28 PM           Slit Lamp and Fundus Exam     External Exam       Right Left   External Normal Normal         Slit Lamp Exam       Right Left   Lids/Lashes Normal Normal   Conjunctiva/Sclera White and quiet White and quiet   Cornea Clear Clear   Anterior Chamber Deep and quiet Deep and quiet   Iris Round and reactive Round and reactive   Lens Posterior chamber intraocular lens Centered posterior chamber  intraocular lens   Anterior Vitreous Normal Normal         Fundus Exam       Right Left   Posterior Vitreous  Normal   Disc  Normal   C/D Ratio  0.45   Macula  Macular thickening, no exudates, no hemorrhage, Choroidal neovascular membrane with PED, Retinal pigment epithelial detachment   Vessels  Normal   Periphery  Normal            IMAGING AND PROCEDURES  Imaging and Procedures for 03/18/22  OCT, Retina - OU - Both Eyes       Right Eye Quality was good. Scan locations included subfoveal. Central Foveal Thickness: 256. Progression has been stable. Findings include normal foveal contour, retinal drusen .   Left Eye Quality was good. Scan locations included subfoveal. Central Foveal Thickness: 332. Progression has improved. Findings include abnormal foveal contour, retinal drusen , pigment epithelial detachment, subretinal fluid, vitreous traction, vitreomacular adhesion .   Notes Recently discovered vascularized pigment epithelial detachment, wet AMD, with subretinal fluid and significant foveal elevation, OS, slightly worse thickening, persistence of subretinal fluid  We will need to repeat injection Eylea OS today and reevaluate in 5 weeks due to disease activity  OD no sign of CNVM     Intravitreal Injection, Pharmacologic Agent - OS - Left Eye       Time Out 03/18/2022. 4:26 PM. Confirmed correct patient, procedure, site, and patient consented.   Anesthesia Subconjunctival anesthesia was used. Anesthetic medications included Lidocaine 4%.   Procedure Preparation included 5% betadine to ocular surface, 10% betadine to eyelids, Tobramycin 0.3%. A 30 gauge needle was used.  Injection: 2 mg aflibercept 2 MG/0.05ML   Route: Intravitreal, Site: Left Eye   NDC: [id number], Lot: [id number], Expiration date: 03/16/2023, Waste: 0 mL   Post-op Post injection exam found visual acuity of at least counting fingers. The patient tolerated the procedure well. There were  no complications. The patient received written and verbal post procedure care education. Post injection medications were not given.              ASSESSMENT/PLAN:  Exudative age-related macular degeneration of left eye with active choroidal neovascularization (HCC) The nature of wet macular degeneration was discussed with the patient.  Forms of therapy reviewed include the use of Anti-VEGF medications injected painlessly into the eye, as well as other possible treatment modalities, including thermal laser therapy. Fellow eye involvement and risks were discussed with the patient. Upon the finding of wet age related macular degeneration, treatment will be offered. The treatment regimen is on a treat as needed basis with the intent to treat if necessary and extend interval of exams when possible. On average 1 out of 6 patients do not need lifetime therapy. However, the risk of recurrent disease is high for a lifetime.  Initially monthly, then periodic, examinations and evaluations will determine whether the next treatment is required on the day of the examination.  OS today at 5 weeks post Eylea doing very well.  Repeat Eylea injection OS today and reevaluate next in 7 to 8 weeks  Intermediate stage nonexudative age-related macular degeneration of both eyes No sign of CNVM OD     ICD-10-CM   1. Exudative age-related macular degeneration of left eye with active choroidal neovascularization (HCC)  H35.3221 OCT, Retina - OU - Both Eyes    Intravitreal Injection, Pharmacologic Agent - OS - Left Eye    aflibercept (EYLEA) SOLN 2 mg    2. Intermediate stage nonexudative age-related macular degeneration of both eyes  H35.3132       1.  OS no interval change in acuity.  Examination today with OCT confirms much less subretinal fluid post Eylea.  Now if there is number of injections okay to extend interval to the full extent of the duration of Eylea which is 8 weeks.  Follow-up next in 8 weeks and  likely examination and injection again  2.  3.  Ophthalmic Meds Ordered this visit:  Meds ordered this encounter  Medications   aflibercept (EYLEA) SOLN 2 mg       Return in about 8 weeks (around 05/13/2022) for dilate, OS, EYLEA OCT.  There are no Patient Instructions on file for this visit.   Explained the diagnoses, plan, and follow up with the patient and they expressed understanding.  Patient expressed understanding of the importance of proper follow up care.   Clent Demark Micah Galeno M.D. Diseases & Surgery of the Retina and Vitreous [facility]ina [facility] Diabetic [place] 03/18/22     Abbreviations: M myopia (nearsighted); A astigmatism; H hyperopia (farsighted); P presbyopia; Mrx spectacle prescription;  CTL contact lenses; OD right eye; OS left eye; OU both eyes  XT exotropia; ET esotropia; PEK punctate epithelial keratitis; PEE punctate epithelial erosions; DES dry eye syndrome; MGD meibomian gland dysfunction; ATs artificial tears; PFAT's preservative free artificial tears; Power nuclear sclerotic cataract; PSC posterior subcapsular cataract; ERM epi-retinal membrane; PVD posterior vitreous detachment; RD retinal detachment; DM diabetes mellitus; DR diabetic retinopathy; NPDR non-proliferative diabetic retinopathy; PDR proliferative diabetic retinopathy; CSME clinically significant macular edema; DME diabetic macular edema; dbh dot blot hemorrhages; CWS cotton wool  spot; POAG primary open angle glaucoma; C/D cup-to-disc ratio; HVF humphrey visual field; GVF goldmann visual field; OCT optical coherence tomography; IOP intraocular pressure; BRVO Branch retinal vein occlusion; CRVO central retinal vein occlusion; CRAO central retinal artery occlusion; BRAO branch retinal artery occlusion; RT retinal tear; SB scleral buckle; PPV pars plana vitrectomy; VH Vitreous hemorrhage; PRP panretinal laser photocoagulation; IVK intravitreal kenalog; VMT vitreomacular traction; MH Macular hole;  NVD  neovascularization of the disc; NVE neovascularization elsewhere; AREDS age related eye disease study; ARMD age related macular degeneration; POAG primary open angle glaucoma; EBMD epithelial/anterior basement membrane dystrophy; ACIOL anterior chamber intraocular lens; IOL intraocular lens; PCIOL posterior chamber intraocular lens; Phaco/IOL phacoemulsification with intraocular lens placement; [place] photorefractive keratectomy; LASIK laser assisted in situ keratomileusis; HTN hypertension; DM diabetes mellitus; COPD chronic obstructive pulmonary disease

## 2022-03-18 NOTE — Assessment & Plan Note (Signed)
The nature of wet macular degeneration was discussed with the patient.  Forms of therapy reviewed include the use of Anti-VEGF medications injected painlessly into the eye, as well as other possible treatment modalities, including thermal laser therapy. Fellow eye involvement and risks were discussed with the patient. Upon the finding of wet age related macular degeneration, treatment will be offered. The treatment regimen is on a treat as needed basis with the intent to treat if necessary and extend interval of exams when possible. On average 1 out of 6 patients do not need lifetime therapy. However, the risk of recurrent disease is high for a lifetime.  Initially monthly, then periodic, examinations and evaluations will determine whether the next treatment is required on the day of the examination.  OS today at 5 weeks post Eylea doing very well.  Repeat Eylea injection OS today and reevaluate next in 7 to 8 weeks

## 2022-03-24 DIAGNOSIS — Z23 Encounter for immunization: Secondary | ICD-10-CM | POA: Diagnosis not present

## 2022-03-24 DIAGNOSIS — I700 Atherosclerosis of aorta: Secondary | ICD-10-CM | POA: Diagnosis not present

## 2022-03-24 DIAGNOSIS — I25810 Atherosclerosis of coronary artery bypass graft(s) without angina pectoris: Secondary | ICD-10-CM | POA: Diagnosis not present

## 2022-03-24 DIAGNOSIS — E782 Mixed hyperlipidemia: Secondary | ICD-10-CM | POA: Diagnosis not present

## 2022-03-24 DIAGNOSIS — I10 Essential (primary) hypertension: Secondary | ICD-10-CM | POA: Diagnosis not present

## 2022-03-24 DIAGNOSIS — K219 Gastro-esophageal reflux disease without esophagitis: Secondary | ICD-10-CM | POA: Diagnosis not present

## 2022-03-24 DIAGNOSIS — J4530 Mild persistent asthma, uncomplicated: Secondary | ICD-10-CM | POA: Diagnosis not present

## 2022-04-18 ENCOUNTER — Other Ambulatory Visit: Payer: Self-pay | Admitting: Interventional Cardiology

## 2022-04-23 DIAGNOSIS — I10 Essential (primary) hypertension: Secondary | ICD-10-CM | POA: Diagnosis not present

## 2022-04-23 DIAGNOSIS — E782 Mixed hyperlipidemia: Secondary | ICD-10-CM | POA: Diagnosis not present

## 2022-05-06 ENCOUNTER — Other Ambulatory Visit: Payer: Medicare HMO

## 2022-05-13 ENCOUNTER — Encounter (INDEPENDENT_AMBULATORY_CARE_PROVIDER_SITE_OTHER): Payer: Medicare HMO | Admitting: Ophthalmology

## 2022-05-15 DIAGNOSIS — H43823 Vitreomacular adhesion, bilateral: Secondary | ICD-10-CM | POA: Diagnosis not present

## 2022-05-15 DIAGNOSIS — H353192 Nonexudative age-related macular degeneration, unspecified eye, intermediate dry stage: Secondary | ICD-10-CM | POA: Diagnosis not present

## 2022-05-15 DIAGNOSIS — H35372 Puckering of macula, left eye: Secondary | ICD-10-CM | POA: Diagnosis not present

## 2022-05-15 DIAGNOSIS — H353221 Exudative age-related macular degeneration, left eye, with active choroidal neovascularization: Secondary | ICD-10-CM | POA: Diagnosis not present

## 2022-06-19 DIAGNOSIS — E119 Type 2 diabetes mellitus without complications: Secondary | ICD-10-CM | POA: Diagnosis not present

## 2022-06-19 DIAGNOSIS — H353112 Nonexudative age-related macular degeneration, right eye, intermediate dry stage: Secondary | ICD-10-CM | POA: Diagnosis not present

## 2022-06-19 DIAGNOSIS — H353221 Exudative age-related macular degeneration, left eye, with active choroidal neovascularization: Secondary | ICD-10-CM | POA: Diagnosis not present

## 2022-06-19 DIAGNOSIS — H43823 Vitreomacular adhesion, bilateral: Secondary | ICD-10-CM | POA: Diagnosis not present

## 2022-06-30 ENCOUNTER — Ambulatory Visit
Admission: RE | Admit: 2022-06-30 | Discharge: 2022-06-30 | Disposition: A | Discharge status: Home / Self Care | Payer: Medicare HMO | Source: Ambulatory Visit | Attending: Internal Medicine | Admitting: Internal Medicine

## 2022-06-30 ENCOUNTER — Other Ambulatory Visit: Payer: Self-pay | Admitting: Internal Medicine

## 2022-06-30 DIAGNOSIS — R059 Cough, unspecified: Secondary | ICD-10-CM

## 2022-06-30 DIAGNOSIS — R5383 Other fatigue: Secondary | ICD-10-CM | POA: Diagnosis not present

## 2022-06-30 DIAGNOSIS — U071 COVID-19: Secondary | ICD-10-CM | POA: Diagnosis not present

## 2022-07-01 DIAGNOSIS — I25810 Atherosclerosis of coronary artery bypass graft(s) without angina pectoris: Secondary | ICD-10-CM | POA: Diagnosis not present

## 2022-07-01 DIAGNOSIS — K219 Gastro-esophageal reflux disease without esophagitis: Secondary | ICD-10-CM | POA: Diagnosis not present

## 2022-07-01 DIAGNOSIS — E782 Mixed hyperlipidemia: Secondary | ICD-10-CM | POA: Diagnosis not present

## 2022-07-01 DIAGNOSIS — N183 Chronic kidney disease, stage 3 unspecified: Secondary | ICD-10-CM | POA: Diagnosis not present

## 2022-07-01 DIAGNOSIS — I10 Essential (primary) hypertension: Secondary | ICD-10-CM | POA: Diagnosis not present

## 2022-07-01 DIAGNOSIS — J4530 Mild persistent asthma, uncomplicated: Secondary | ICD-10-CM | POA: Diagnosis not present

## 2022-07-01 DIAGNOSIS — M8580 Other specified disorders of bone density and structure, unspecified site: Secondary | ICD-10-CM | POA: Diagnosis not present

## 2022-07-30 DIAGNOSIS — Z961 Presence of intraocular lens: Secondary | ICD-10-CM | POA: Diagnosis not present

## 2022-07-30 DIAGNOSIS — H353111 Nonexudative age-related macular degeneration, right eye, early dry stage: Secondary | ICD-10-CM | POA: Diagnosis not present

## 2022-07-30 DIAGNOSIS — H353221 Exudative age-related macular degeneration, left eye, with active choroidal neovascularization: Secondary | ICD-10-CM | POA: Diagnosis not present

## 2022-08-04 DIAGNOSIS — H353132 Nonexudative age-related macular degeneration, bilateral, intermediate dry stage: Secondary | ICD-10-CM | POA: Diagnosis not present

## 2022-08-04 DIAGNOSIS — H353221 Exudative age-related macular degeneration, left eye, with active choroidal neovascularization: Secondary | ICD-10-CM | POA: Diagnosis not present

## 2022-08-12 ENCOUNTER — Ambulatory Visit: Payer: Medicare HMO | Attending: Cardiovascular Disease | Admitting: Cardiovascular Disease

## 2022-08-12 ENCOUNTER — Encounter: Payer: Self-pay | Admitting: Cardiovascular Disease

## 2022-08-12 VITALS — BP 154/76 | HR 88 | Ht 64.0 in | Wt 125.4 lb

## 2022-08-12 DIAGNOSIS — I739 Peripheral vascular disease, unspecified: Secondary | ICD-10-CM

## 2022-08-12 DIAGNOSIS — E785 Hyperlipidemia, unspecified: Secondary | ICD-10-CM

## 2022-08-12 DIAGNOSIS — I10 Essential (primary) hypertension: Secondary | ICD-10-CM | POA: Diagnosis not present

## 2022-08-12 DIAGNOSIS — I2510 Atherosclerotic heart disease of native coronary artery without angina pectoris: Secondary | ICD-10-CM

## 2022-08-12 NOTE — Progress Notes (Unsigned)
Cardiology Office Note   Date:  08/13/2022   ID:  Melissa Elliott, DOB [birth year], MRN [id number]  PCP:  Wenda Low, MD  Cardiologist: Dr. Irish Lack  No chief complaint on file.     History of Present Illness: Melissa Elliott is a 82 y.o. female who is here today for follow-up visit regarding peripheral arterial disease.   She has known history of coronary artery disease status post CABG in 1996 with subsequent stenting.  She has chronic medical conditions that include hyperlipidemia, stomach ulcers, prediabetes, essential hypertension and migraines. She is a very active person and exercises on a regular basis.  She was seen in 2021 for progressive left calf claudication which significantly affected her ability to exercise. She underwent vascular studies in March which showed an ABI of 0.94 on the right and 0.79 on the left.  Duplex showed severe distal left SFA stenosis with peak velocity of 461.  Symptoms did not improve with the walking program and thus I proceeded with angiography in July, 2021 which showed no significant aortoiliac disease, there was severe stenosis in the distal left SFA with two-vessel runoff below the knee.  I performed successful orbital atherectomy and drug-coated balloon angioplasty to the distal left SFA.  Most recent Doppler studies in March of 2023 showed a drop in ABI on the right side to 0.85 and on the left at 0.80.  Duplex on the right showed moderate SFA disease and on the left showed significant mid to distal SFA stenosis.  She went on a trip to [place] last summer and did well overall from a claudication standpoint with minimal symptoms.  Her activities decreased over the last 4 months due to COVID illness and other social issues.  She has not been walking on a regular basis but planning to resume exercising.  She has mild bilateral calf claudication.    Past Medical History:  Diagnosis Date   Asthma    Complication of anesthesia    "takes  a long time to wakeup; sometimes days" (05/11/2018)   Coronary artery disease    H/O hiatal hernia    Heart murmur    High cholesterol    "on RX because of the bypass" (02/01/2013)   History of duodenal ulcer    Hot flashes    "on a regular basis" (05/11/2018)   Hypertension    Hypoglycemia    "that's something I have to watch constantly" (02/01/2013)   Migraine    "probably stopped w/menses" (05/11/2018)   Pneumonia ? 1970's   "once" (05/11/2018)   Sciatic nerve pain 12/2012   "RLE; just had it once" (05/11/2018)    Past Surgical History:  Procedure Laterality Date   ABDOMINAL AORTOGRAM W/LOWER EXTREMITY N/A 02/08/2020   Procedure: ABDOMINAL AORTOGRAM W/LOWER EXTREMITY;  Surgeon: Wellington Hampshire, MD;  Location: [facility];  Service: Cardiovascular;  Laterality: N/A;   APPENDECTOMY  1970   BREAST BIOPSY Left    benign   COLONOSCOPY WITH PROPOFOL N/A 06/19/2015   Procedure: COLONOSCOPY WITH PROPOFOL;  Surgeon: Garlan Fair, MD;  Location: [facility];  Service: Endoscopy;  Laterality: N/A;   CORONARY ANGIOPLASTY WITH STENT PLACEMENT  05/11/2018   CORONARY ANGIOPLASTY WITH STENT PLACEMENT  1997   CORONARY ARTERY BYPASS GRAFT  ~ 1997   CABG X2   CORONARY STENT INTERVENTION N/A 05/11/2018   Procedure: CORONARY STENT INTERVENTION;  Surgeon: Jettie Booze, MD;  Location: [facility];  Service: Cardiovascular;  Laterality:  N/A;   ESOPHAGOGASTRODUODENOSCOPY N/A 02/02/2013   Procedure: ESOPHAGOGASTRODUODENOSCOPY (EGD);  Surgeon: Wonda Horner, MD;  Location: [facility];  Service: Endoscopy;  Laterality: N/A;   LEFT HEART CATH AND CORS/GRAFTS ANGIOGRAPHY N/A 05/11/2018   Procedure: LEFT HEART CATH AND CORS/GRAFTS ANGIOGRAPHY;  Surgeon: Jettie Booze, MD;  Location: [facility];  Service: Cardiovascular;  Laterality: N/A;   LEFT HEART CATH AND CORS/GRAFTS ANGIOGRAPHY N/A 05/14/2018   Procedure: LEFT HEART CATH AND CORS/GRAFTS ANGIOGRAPHY;  Surgeon:  Jettie Booze, MD;  Location: [facility];  Service: Cardiovascular;  Laterality: N/A;   PERIPHERAL VASCULAR ATHERECTOMY Left 02/08/2020   Procedure: PERIPHERAL VASCULAR ATHERECTOMY;  Surgeon: Wellington Hampshire, MD;  Location: [facility];  Service: Cardiovascular;  Laterality: Left;  sfa (distal)   PERIPHERAL VASCULAR BALLOON ANGIOPLASTY Left 02/08/2020   Procedure: PERIPHERAL VASCULAR BALLOON ANGIOPLASTY;  Surgeon: Wellington Hampshire, MD;  Location: [facility];  Service: Cardiovascular;  Laterality: Left;  sfa (distal)   SKIN LESION EXCISION Left 1960's   "upper arm; sore that never healed" (02/01/2013)   TONSILLECTOMY  1940's     Current Outpatient Medications  Medication Sig Dispense Refill   ADVAIR DISKUS 250-50 MCG/DOSE AEPB Inhale 1 puff into the lungs 2 (two) times daily. Only uses in winter and spring months  11   albuterol (PROVENTIL HFA;VENTOLIN HFA) 108 (90 Base) MCG/ACT inhaler Inhale 2 puffs into the lungs every 6 (six) hours as needed for wheezing. 1 Inhaler 3   aspirin 81 MG EC tablet Take 81 mg by mouth daily. Swallow whole.     Calcium Carbonate-Vitamin D (CALCIUM-D) 600-400 MG-UNIT TABS Take 1 tablet by mouth daily in the afternoon.      cholecalciferol (VITAMIN D) 1000 units tablet Take 1,000 Units by mouth daily with lunch.     clopidogrel (PLAVIX) 75 MG tablet TAKE 1 TABLET BY MOUTH EVERY DAY 90 tablet 3   fexofenadine (ALLEGRA) 180 MG tablet Take 180 mg by mouth daily.     irbesartan (AVAPRO) 150 MG tablet TAKE 1 TABLET(150 MG) BY MOUTH DAILY 90 tablet 2   metFORMIN (GLUCOPHAGE) 500 MG tablet Take 500 mg by mouth daily. with food  1   metoprolol succinate (TOPROL-XL) 50 MG 24 hr tablet Take 50 mg by mouth daily.      montelukast (SINGULAIR) 10 MG tablet Take 10 mg by mouth at bedtime.     Multiple Vitamins-Minerals (PRESERVISION AREDS 2) CAPS Take 1 capsule by mouth 2 (two) times daily.     nitroGLYCERIN (NITROSTAT) 0.4 MG SL tablet Place 0.4  mg under the tongue every 5 (five) minutes as needed for chest pain (UP TO 3 DOSES BEFORE CALLING EMS).     rosuvastatin (CRESTOR) 20 MG tablet Take 1 tablet (20 mg total) by mouth daily. 90 tablet 3   No current facility-administered medications for this visit.    Allergies:   Ciprofloxacin, Sulfa antibiotics, Iodine, Eggs or egg-derived products, and Penicillins    Social History:  The patient  reports that she has never smoked. She has never used smokeless tobacco. She reports current alcohol use. She reports that she does not use drugs.   Family History:  The patient's family history includes Asthma in her mother; Breast cancer in her paternal aunt; CAD in her mother; Heart attack in her father; Hypertension in her father, maternal grandfather, maternal grandmother, and mother.    ROS:  Please see the history of present illness.   Otherwise,  review of systems are positive for none.   All other systems are reviewed and negative.    PHYSICAL EXAM: VS:  BP (!) 154/76   Pulse 88   Ht '5\' 4"'$  (1.626 m)   Wt 125 lb 6.4 oz (56.9 kg)   SpO2 95%   BMI 21.52 kg/m  , BMI Body mass index is 21.52 kg/m. GEN: Well nourished, well developed, in no acute distress  HEENT: normal  Neck: no JVD, carotid bruits, or masses Cardiac: RRR; no murmurs, rubs, or gallops,no edema  Respiratory:  clear to auscultation bilaterally, normal work of breathing GI: soft, nontender, nondistended, + BS MS: no deformity or atrophy  Skin: warm and dry, no rash Neuro:  Strength and sensation are intact Psych: euthymic mood, full affect  EKG:  EKG ordered today. EKG showed normal sinus rhythm with no significant ST or T wave changes.   Recent Labs: No results found for requested labs within last 365 days.    Lipid Panel    Component Value Date/Time   CHOL 157 05/14/2018 0203   TRIG 120 05/14/2018 0203   HDL 52 05/14/2018 0203   CHOLHDL 3.0 05/14/2018 0203   VLDL 24 05/14/2018 0203   LDLCALC 81  05/14/2018 0203      Wt Readings from Last 3 Encounters:  08/12/22 125 lb 6.4 oz (56.9 kg)  02/10/22 124 lb (56.2 kg)  12/24/21 121 lb 9.6 oz (55.2 kg)            No data to display            ASSESSMENT AND PLAN:  1.  Peripheral arterial disease: Status post endovascular intervention on left SFA for severe claudication.  She has evidence of restenosis on the left side but her claudication continues to be mild and not lifestyle limiting.  I asked her to resume her walking exercise program.  Repeat Doppler studies in March and follow-up with me on a yearly basis.   2.  Coronary artery disease involving native coronary arteries without angina: She is overall doing well.  She is on dual antiplatelet therapy.  3.  Essential hypertension: Blood pressure is mildly elevated but usually is well controlled.  4.  Hyperlipidemia: She was switched from simvastatin to rosuvastatin.  Recommended target LDL of less than 70.   Disposition:   FU with me in 12 months  Signed,  Kathlyn [place], MD  08/13/2022 4:25 PM    [facility]

## 2022-08-12 NOTE — Patient Instructions (Signed)
Medication Instructions:  No changes *If you need a refill on your cardiac medications before your next appointment, please call your pharmacy*   Lab Work: None ordered If you have labs (blood work) drawn today and your tests are completely normal, you will receive your results only by: [place] (if you have MyChart) OR A paper copy in the mail If you have any lab test that is abnormal or we need to change your treatment, we will call you to review the results.   Testing/Procedures: Your physician has requested that you have a lower extremity arterial duplex in March. During this test, ultrasound is used to evaluate arterial blood flow in the legs. Allow one hour for this exam. There are no restrictions or special instructions. This will take place at [place], [address].  Your physician has requested that you have an ankle brachial index (ABI) in March. During this test an ultrasound and blood pressure cuff are used to evaluate the arteries that supply the arms and legs with blood. Allow thirty minutes for this exam. There are no restrictions or special instructions. This will take place at [place], [address].      Follow-Up: At [facility], you and your health needs are our priority.  As part of our continuing mission to provide you with exceptional heart care, we have created designated Provider Care Teams.  These Care Teams include your primary Cardiologist (physician) and Advanced Practice Providers (APPs -  Physician Assistants and Nurse Practitioners) who all work together to provide you with the care you need, when you need it.  We recommend signing up for the patient portal called "MyChart".  Sign up information is provided on this After Visit Summary.  MyChart is used to connect with patients for Virtual Visits (Telemedicine).  Patients are able to view lab/test results, encounter notes, upcoming appointments, etc.  Non-urgent messages can be  sent to your provider as well.   To learn more about what you can do with MyChart, go to [email or web address].    Your next appointment:   12 month(s)  Provider:   Dr. Fletcher Anon

## 2022-08-15 ENCOUNTER — Other Ambulatory Visit: Payer: Self-pay | Admitting: Internal Medicine

## 2022-08-15 ENCOUNTER — Ambulatory Visit
Admission: RE | Admit: 2022-08-15 | Discharge: 2022-08-15 | Disposition: A | Discharge status: Home / Self Care | Payer: Medicare HMO | Source: Ambulatory Visit | Attending: Internal Medicine | Admitting: Internal Medicine

## 2022-08-15 DIAGNOSIS — R051 Acute cough: Secondary | ICD-10-CM | POA: Diagnosis not present

## 2022-08-15 DIAGNOSIS — R0602 Shortness of breath: Secondary | ICD-10-CM | POA: Diagnosis not present

## 2022-08-15 DIAGNOSIS — R059 Cough, unspecified: Secondary | ICD-10-CM | POA: Diagnosis not present

## 2022-08-15 DIAGNOSIS — Z03818 Encounter for observation for suspected exposure to other biological agents ruled out: Secondary | ICD-10-CM | POA: Diagnosis not present

## 2022-08-15 DIAGNOSIS — R5383 Other fatigue: Secondary | ICD-10-CM | POA: Diagnosis not present

## 2022-08-15 DIAGNOSIS — J101 Influenza due to other identified influenza virus with other respiratory manifestations: Secondary | ICD-10-CM | POA: Diagnosis not present

## 2022-08-15 DIAGNOSIS — J069 Acute upper respiratory infection, unspecified: Secondary | ICD-10-CM | POA: Diagnosis not present

## 2022-09-03 DIAGNOSIS — H353221 Exudative age-related macular degeneration, left eye, with active choroidal neovascularization: Secondary | ICD-10-CM | POA: Diagnosis not present

## 2022-09-03 DIAGNOSIS — H353112 Nonexudative age-related macular degeneration, right eye, intermediate dry stage: Secondary | ICD-10-CM | POA: Diagnosis not present

## 2022-09-22 ENCOUNTER — Ambulatory Visit (HOSPITAL_COMMUNITY)
Admission: RE | Admit: 2022-09-22 | Discharge: 2022-09-22 | Disposition: A | Discharge status: Home / Self Care | Payer: Medicare HMO | Source: Ambulatory Visit | Attending: Cardiovascular Disease | Admitting: Cardiovascular Disease

## 2022-09-22 DIAGNOSIS — I739 Peripheral vascular disease, unspecified: Secondary | ICD-10-CM | POA: Insufficient documentation

## 2022-09-22 LAB — VAS US ABI WITH/WO TBI
Left ABI: 0.69
Right ABI: 0.9

## 2022-09-25 ENCOUNTER — Other Ambulatory Visit: Payer: Self-pay | Admitting: *Deleted

## 2022-09-25 DIAGNOSIS — I739 Peripheral vascular disease, unspecified: Secondary | ICD-10-CM

## 2022-10-08 DIAGNOSIS — H353132 Nonexudative age-related macular degeneration, bilateral, intermediate dry stage: Secondary | ICD-10-CM | POA: Diagnosis not present

## 2022-10-08 DIAGNOSIS — H353221 Exudative age-related macular degeneration, left eye, with active choroidal neovascularization: Secondary | ICD-10-CM | POA: Diagnosis not present

## 2022-10-10 ENCOUNTER — Other Ambulatory Visit: Payer: Self-pay | Admitting: Internal Medicine

## 2022-10-10 DIAGNOSIS — Z1231 Encounter for screening mammogram for malignant neoplasm of breast: Secondary | ICD-10-CM

## 2022-10-14 DIAGNOSIS — H5712 Ocular pain, left eye: Secondary | ICD-10-CM | POA: Diagnosis not present

## 2022-10-14 DIAGNOSIS — H35363 Drusen (degenerative) of macula, bilateral: Secondary | ICD-10-CM | POA: Diagnosis not present

## 2022-10-14 DIAGNOSIS — H353112 Nonexudative age-related macular degeneration, right eye, intermediate dry stage: Secondary | ICD-10-CM | POA: Diagnosis not present

## 2022-10-14 DIAGNOSIS — H11432 Conjunctival hyperemia, left eye: Secondary | ICD-10-CM | POA: Diagnosis not present

## 2022-10-14 DIAGNOSIS — H35453 Secondary pigmentary degeneration, bilateral: Secondary | ICD-10-CM | POA: Diagnosis not present

## 2022-10-14 DIAGNOSIS — H353221 Exudative age-related macular degeneration, left eye, with active choroidal neovascularization: Secondary | ICD-10-CM | POA: Diagnosis not present

## 2022-10-14 DIAGNOSIS — Z961 Presence of intraocular lens: Secondary | ICD-10-CM | POA: Diagnosis not present

## 2022-10-23 ENCOUNTER — Ambulatory Visit
Admission: RE | Admit: 2022-10-23 | Discharge: 2022-10-23 | Disposition: A | Discharge status: Home / Self Care | Payer: Medicare HMO | Source: Ambulatory Visit | Attending: Internal Medicine | Admitting: Internal Medicine

## 2022-10-23 DIAGNOSIS — Z1231 Encounter for screening mammogram for malignant neoplasm of breast: Secondary | ICD-10-CM

## 2022-11-10 DIAGNOSIS — H353221 Exudative age-related macular degeneration, left eye, with active choroidal neovascularization: Secondary | ICD-10-CM | POA: Diagnosis not present

## 2022-11-10 DIAGNOSIS — H353132 Nonexudative age-related macular degeneration, bilateral, intermediate dry stage: Secondary | ICD-10-CM | POA: Diagnosis not present

## 2022-12-29 DIAGNOSIS — H353132 Nonexudative age-related macular degeneration, bilateral, intermediate dry stage: Secondary | ICD-10-CM | POA: Diagnosis not present

## 2022-12-29 DIAGNOSIS — H353221 Exudative age-related macular degeneration, left eye, with active choroidal neovascularization: Secondary | ICD-10-CM | POA: Diagnosis not present

## 2023-01-01 DIAGNOSIS — R7303 Prediabetes: Secondary | ICD-10-CM | POA: Diagnosis not present

## 2023-01-01 DIAGNOSIS — E559 Vitamin D deficiency, unspecified: Secondary | ICD-10-CM | POA: Diagnosis not present

## 2023-01-01 DIAGNOSIS — E782 Mixed hyperlipidemia: Secondary | ICD-10-CM | POA: Diagnosis not present

## 2023-01-01 DIAGNOSIS — I10 Essential (primary) hypertension: Secondary | ICD-10-CM | POA: Diagnosis not present

## 2023-01-01 DIAGNOSIS — I25810 Atherosclerosis of coronary artery bypass graft(s) without angina pectoris: Secondary | ICD-10-CM | POA: Diagnosis not present

## 2023-01-01 DIAGNOSIS — I700 Atherosclerosis of aorta: Secondary | ICD-10-CM | POA: Diagnosis not present

## 2023-01-01 DIAGNOSIS — M8580 Other specified disorders of bone density and structure, unspecified site: Secondary | ICD-10-CM | POA: Diagnosis not present

## 2023-01-01 DIAGNOSIS — I739 Peripheral vascular disease, unspecified: Secondary | ICD-10-CM | POA: Diagnosis not present

## 2023-01-01 DIAGNOSIS — Z0000 Encounter for general adult medical examination without abnormal findings: Secondary | ICD-10-CM | POA: Diagnosis not present

## 2023-01-01 DIAGNOSIS — J4530 Mild persistent asthma, uncomplicated: Secondary | ICD-10-CM | POA: Diagnosis not present

## 2023-01-28 ENCOUNTER — Other Ambulatory Visit: Payer: Self-pay | Admitting: Interventional Cardiology

## 2023-02-16 ENCOUNTER — Other Ambulatory Visit: Payer: Self-pay | Admitting: Interventional Cardiology

## 2023-02-23 DIAGNOSIS — H35721 Serous detachment of retinal pigment epithelium, right eye: Secondary | ICD-10-CM | POA: Diagnosis not present

## 2023-02-23 DIAGNOSIS — H353132 Nonexudative age-related macular degeneration, bilateral, intermediate dry stage: Secondary | ICD-10-CM | POA: Diagnosis not present

## 2023-02-23 DIAGNOSIS — H353221 Exudative age-related macular degeneration, left eye, with active choroidal neovascularization: Secondary | ICD-10-CM | POA: Diagnosis not present

## 2023-03-12 DIAGNOSIS — Z86018 Personal history of other benign neoplasm: Secondary | ICD-10-CM | POA: Diagnosis not present

## 2023-03-12 DIAGNOSIS — L578 Other skin changes due to chronic exposure to nonionizing radiation: Secondary | ICD-10-CM | POA: Diagnosis not present

## 2023-03-12 DIAGNOSIS — Z872 Personal history of diseases of the skin and subcutaneous tissue: Secondary | ICD-10-CM | POA: Diagnosis not present

## 2023-03-27 DIAGNOSIS — E1122 Type 2 diabetes mellitus with diabetic chronic kidney disease: Secondary | ICD-10-CM | POA: Diagnosis not present

## 2023-04-13 DIAGNOSIS — H353221 Exudative age-related macular degeneration, left eye, with active choroidal neovascularization: Secondary | ICD-10-CM | POA: Diagnosis not present

## 2023-04-13 DIAGNOSIS — H353132 Nonexudative age-related macular degeneration, bilateral, intermediate dry stage: Secondary | ICD-10-CM | POA: Diagnosis not present

## 2023-04-13 DIAGNOSIS — H35721 Serous detachment of retinal pigment epithelium, right eye: Secondary | ICD-10-CM | POA: Diagnosis not present

## 2023-05-25 DIAGNOSIS — H35721 Serous detachment of retinal pigment epithelium, right eye: Secondary | ICD-10-CM | POA: Diagnosis not present

## 2023-05-25 DIAGNOSIS — H353221 Exudative age-related macular degeneration, left eye, with active choroidal neovascularization: Secondary | ICD-10-CM | POA: Diagnosis not present

## 2023-05-25 DIAGNOSIS — H353132 Nonexudative age-related macular degeneration, bilateral, intermediate dry stage: Secondary | ICD-10-CM | POA: Diagnosis not present

## 2023-06-29 DIAGNOSIS — H353132 Nonexudative age-related macular degeneration, bilateral, intermediate dry stage: Secondary | ICD-10-CM | POA: Diagnosis not present

## 2023-06-29 DIAGNOSIS — H35721 Serous detachment of retinal pigment epithelium, right eye: Secondary | ICD-10-CM | POA: Diagnosis not present

## 2023-06-29 DIAGNOSIS — H353221 Exudative age-related macular degeneration, left eye, with active choroidal neovascularization: Secondary | ICD-10-CM | POA: Diagnosis not present

## 2023-08-04 DIAGNOSIS — I700 Atherosclerosis of aorta: Secondary | ICD-10-CM | POA: Diagnosis not present

## 2023-08-04 DIAGNOSIS — E1122 Type 2 diabetes mellitus with diabetic chronic kidney disease: Secondary | ICD-10-CM | POA: Diagnosis not present

## 2023-08-04 DIAGNOSIS — I25810 Atherosclerosis of coronary artery bypass graft(s) without angina pectoris: Secondary | ICD-10-CM | POA: Diagnosis not present

## 2023-08-04 DIAGNOSIS — E782 Mixed hyperlipidemia: Secondary | ICD-10-CM | POA: Diagnosis not present

## 2023-08-04 DIAGNOSIS — H353220 Exudative age-related macular degeneration, left eye, stage unspecified: Secondary | ICD-10-CM | POA: Diagnosis not present

## 2023-08-04 DIAGNOSIS — I10 Essential (primary) hypertension: Secondary | ICD-10-CM | POA: Diagnosis not present

## 2023-08-04 DIAGNOSIS — N182 Chronic kidney disease, stage 2 (mild): Secondary | ICD-10-CM | POA: Diagnosis not present

## 2023-08-04 DIAGNOSIS — I739 Peripheral vascular disease, unspecified: Secondary | ICD-10-CM | POA: Diagnosis not present

## 2023-08-04 DIAGNOSIS — J4530 Mild persistent asthma, uncomplicated: Secondary | ICD-10-CM | POA: Diagnosis not present

## 2023-08-11 DIAGNOSIS — H353221 Exudative age-related macular degeneration, left eye, with active choroidal neovascularization: Secondary | ICD-10-CM | POA: Diagnosis not present

## 2023-08-11 DIAGNOSIS — H35721 Serous detachment of retinal pigment epithelium, right eye: Secondary | ICD-10-CM | POA: Diagnosis not present

## 2023-08-11 DIAGNOSIS — H353132 Nonexudative age-related macular degeneration, bilateral, intermediate dry stage: Secondary | ICD-10-CM | POA: Diagnosis not present

## 2023-08-18 ENCOUNTER — Ambulatory Visit: Payer: Medicare HMO | Attending: Cardiovascular Disease | Admitting: Cardiovascular Disease

## 2023-08-18 ENCOUNTER — Encounter: Payer: Self-pay | Admitting: Cardiovascular Disease

## 2023-08-18 VITALS — BP 132/88 | HR 81 | Ht 64.0 in | Wt 114.6 lb

## 2023-08-18 DIAGNOSIS — E785 Hyperlipidemia, unspecified: Secondary | ICD-10-CM | POA: Diagnosis not present

## 2023-08-18 DIAGNOSIS — I2510 Atherosclerotic heart disease of native coronary artery without angina pectoris: Secondary | ICD-10-CM | POA: Diagnosis not present

## 2023-08-18 DIAGNOSIS — I739 Peripheral vascular disease, unspecified: Secondary | ICD-10-CM | POA: Diagnosis not present

## 2023-08-18 DIAGNOSIS — I10 Essential (primary) hypertension: Secondary | ICD-10-CM | POA: Diagnosis not present

## 2023-08-18 NOTE — Progress Notes (Signed)
 Cardiology Office Note   Date:  08/18/2023   ID:  VIOLIA Elliott, DOB 1940/07/31, MRN [id number]  PCP:  Ransom Other, MD  Cardiologist: Dr. Darron  No chief complaint on file.     History of Present Illness: Melissa Elliott is a 83 y.o. female who is here today for follow-up visit regarding peripheral arterial disease.   She has known history of coronary artery disease status post CABG in 1996 with subsequent stenting.  She has chronic medical conditions that include hyperlipidemia, stomach ulcers, prediabetes, essential hypertension and migraines. She is a very active person and exercises on a regular basis.  She was seen in 2021 for progressive left calf claudication which significantly affected her ability to exercise. She underwent vascular studies in March which showed an ABI of 0.94 on the right and 0.79 on the left.  Duplex showed severe distal left SFA stenosis with peak velocity of 461.  Symptoms did not improve with the walking program and thus I proceeded with angiography in July, 2021 which showed no significant aortoiliac disease, there was severe stenosis in the distal left SFA with two-vessel runoff below the knee.  I performed successful orbital atherectomy and drug-coated balloon angioplasty to the distal left SFA.  Most recent Doppler studies in March 2024 showed an ABI of 0.90 on the right and 0.69 on the left.  Duplex showed significant stenosis in the distal left SFA.  She has been doing well with no chest pain or shortness of breath.  She reports mild left calf claudication but she is able to work through it.  She has no rest pain or lower extremity ulceration.  She takes her medications on a regular basis.    Past Medical History:  Diagnosis Date   Asthma    Complication of anesthesia    takes a long time to wakeup; sometimes days (05/11/2018)   Coronary artery disease    H/O hiatal hernia    Heart murmur    High cholesterol    on RX because of  the bypass (02/01/2013)   History of duodenal ulcer    Hot flashes    on a regular basis (05/11/2018)   Hypertension    Hypoglycemia    that's something I have to watch constantly (02/01/2013)   Migraine    probably stopped w/menses (05/11/2018)   Pneumonia ? 1970's   once (05/11/2018)   Sciatic nerve pain 12/2012   RLE; just had it once (05/11/2018)    Past Surgical History:  Procedure Laterality Date   ABDOMINAL AORTOGRAM W/LOWER EXTREMITY N/A 02/08/2020   Procedure: ABDOMINAL AORTOGRAM W/LOWER EXTREMITY;  Surgeon: Darron Deatrice LABOR, MD;  Location: [facility] INVASIVE CV [facility];  Service: Cardiovascular;  Laterality: N/A;   APPENDECTOMY  1970   BREAST BIOPSY Left    benign   COLONOSCOPY WITH PROPOFOL  N/A 06/19/2015   Procedure: COLONOSCOPY WITH PROPOFOL ;  Surgeon: Gladis MARLA Louder, MD;  Location: [facility] ENDOSCOPY;  Service: Endoscopy;  Laterality: N/A;   CORONARY ANGIOPLASTY WITH STENT PLACEMENT  05/11/2018   CORONARY ANGIOPLASTY WITH STENT PLACEMENT  1997   CORONARY ARTERY BYPASS GRAFT  ~ 1997   CABG X2   CORONARY STENT INTERVENTION N/A 05/11/2018   Procedure: CORONARY STENT INTERVENTION;  Surgeon: Dann Candyce RAMAN, MD;  Location: [facility] INVASIVE CV [facility];  Service: Cardiovascular;  Laterality: N/A;   ESOPHAGOGASTRODUODENOSCOPY N/A 02/02/2013   Procedure: ESOPHAGOGASTRODUODENOSCOPY (EGD);  Surgeon: Lesta JULIANNA Fitz, MD;  Location: [facility] ENDOSCOPY;  Service: Endoscopy;  Laterality: N/A;  LEFT HEART CATH AND CORS/GRAFTS ANGIOGRAPHY N/A 05/11/2018   Procedure: LEFT HEART CATH AND CORS/GRAFTS ANGIOGRAPHY;  Surgeon: Dann Candyce RAMAN, MD;  Location: [facility] INVASIVE CV LAB;  Service: Cardiovascular;  Laterality: N/A;   LEFT HEART CATH AND CORS/GRAFTS ANGIOGRAPHY N/A 05/14/2018   Procedure: LEFT HEART CATH AND CORS/GRAFTS ANGIOGRAPHY;  Surgeon: Dann Candyce RAMAN, MD;  Location[facility] INVASIVE CV LAB;  Service: Cardiovascular;  Laterality: N/A;   PERIPHERAL VASCULAR ATHERECTOMY Left 02/08/2020    Procedure: PERIPHERAL VASCULAR ATHERECTOMY;  Surgeon: Darron Deatrice LABOR, MD;  Location: [facility] INV[facility];  Service: Cardiovascular;  Laterality: Left;  sfa (distal)   PERIPHERAL VASCULAR BALLOON ANGIOPLASTY Left 02/08/2020   Procedure: PERIPHERAL VASCULAR BALLOON ANGIOPLASTY;  Surgeon: Darron Deatrice LABOR, MD;  Location: [facility] INVASIVE CV LAB;  Service: Cardiovascular;  Laterality: Left;  sfa (distal)   SKIN LESION EXCISION Left 1960's   upper arm; sore that never healed (02/01/2013)   TONSILLECTOMY  1940's     Current Outpatient Medications  Medication Sig Dispense Refill   ADVAIR DISKUS 250-50 MCG/DOSE AEPB Inhale 1 puff into the lungs 2 (two) times daily. Only uses in winter and spring months  11   albuterol  (PROVENTIL  HFA;VENTOLIN  HFA) 108 (90 Base) MCG/ACT inhaler Inhale 2 puffs into the lungs every 6 (six) hours as needed for wheezing. 1 Inhaler 3   aspirin  81 MG EC tablet Take 81 mg by mouth daily. Swallow whole.     Calcium  Carbonate-Vitamin D  (CALCIUM -D) 600-400 MG-UNIT TABS Take 1 tablet by mouth daily in the afternoon.      cholecalciferol (VITAMIN D ) 1000 units tablet Take 1,000 Units by mouth daily with lunch.     clopidogrel  (PLAVIX ) 75 MG tablet TAKE 1 TABLET BY MOUTH EVERY DAY 90 tablet 1   fexofenadine (ALLEGRA) 180 MG tablet Take 180 mg by mouth daily.     irbesartan  (AVAPRO ) 150 MG tablet TAKE 1 TABLET(150 MG) BY MOUTH DAILY 90 tablet 2   metFORMIN (GLUCOPHAGE) 500 MG tablet Take 500 mg by mouth daily. with food  1   metoprolol  succinate (TOPROL -XL) 50 MG 24 hr tablet Take 50 mg by mouth daily.      montelukast  (SINGULAIR ) 10 MG tablet Take 10 mg by mouth at bedtime.     Multiple Vitamins-Minerals (PRESERVISION AREDS 2) CAPS Take 1 capsule by mouth 2 (two) times daily.     nitroGLYCERIN  (NITROSTAT ) 0.4 MG SL tablet Place 0.4 mg under the tongue every 5 (five) minutes as needed for chest pain (UP TO 3 DOSES BEFORE CALLING EMS).     rosuvastatin  (CRESTOR ) 20 MG tablet TAKE 1  TABLET BY MOUTH EVERY DAY 90 tablet 3   No current facility-administered medications for this visit.    Allergies:   Ciprofloxacin, Sulfa antibiotics, Iodine , Egg-derived products, and Penicillins    Social History:  The patient  reports that she has never smoked. She has never used smokeless tobacco. She reports current alcohol use. She reports that she does not use drugs.   Family History:  The patient's family history includes Asthma in her mother; Breast cancer in her paternal aunt; CAD in her mother; Heart attack in her father; Hypertension in her father, maternal grandfather, maternal grandmother, and mother.    ROS:  Please see the history of present illness.   Otherwise, review of systems are positive for none.   All other systems are reviewed and negative.    PHYSICAL EXAM: VS:  BP 132/88   Pulse 81   Ht 5'  4 (1.626 m)   Wt 114 lb 9.6 oz (52 kg)   SpO2 96%   BMI 19.67 kg/m  , BMI Body mass index is 19.67 kg/m. GEN: Well nourished, well developed, in no acute distress  HEENT: normal  Neck: no JVD, carotid bruits, or masses Cardiac: RRR; no  rubs, or gallops,no edema .  1 out of 6 systolic murmur in the aortic area. Respiratory:  clear to auscultation bilaterally, normal work of breathing GI: soft, nontender, nondistended, + BS MS: no deformity or atrophy  Skin: warm and dry, no rash Neuro:  Strength and sensation are intact Psych: euthymic mood, full affect  EKG:  EKG ordered today. EKG showed: Normal sinus rhythm Incomplete right bundle branch block    Recent Labs: No results found for requested labs within last 365 days.    Lipid Panel    Component Value Date/Time   CHOL 157 05/14/2018 0203   TRIG 120 05/14/2018 0203   HDL 52 05/14/2018 0203   CHOLHDL 3.0 05/14/2018 0203   VLDL 24 05/14/2018 0203   LDLCALC 81 05/14/2018 0203      Wt Readings from Last 3 Encounters:  08/18/23 114 lb 9.6 oz (52 kg)  08/12/22 125 lb 6.4 oz (56.9 kg)  02/10/22 124  lb (56.2 kg)            No data to display            ASSESSMENT AND PLAN:  1.  Peripheral arterial disease: Status post endovascular intervention on left SFA for severe claudication.  She does have evidence of restenosis in the left SFA and I suspect that the SFA is going to occlude at some point.  Fortunately, her claudication is mild and not lifestyle limiting.  Thus, there is currently no indication for revascularization.  Continue dual antiplatelet therapy as tolerated given peripheral arterial disease and coronary artery disease. I discussed with her the importance of a daily walking program and provided her with instructions to manage her claudication. The patient was educated on risk factors surrounding PAD. I discussed the importance of controlling risk factors and importance of regular exercise to help reduce pain. I discussed the effects of exercise on reducing claudications.   2.  Coronary artery disease involving native coronary arteries without angina: She is overall doing well.  She is on dual antiplatelet therapy.  3.  Essential hypertension: Blood pressure is mildly elevated but usually is well controlled.  4.  Hyperlipidemia: Continue treatment with rosuvastatin  with a target LDL of less than 70.  This is managed by her primary care physician.   Disposition:   FU with me in 12 months  Signed,  Deatrice Cage, MD  08/18/2023 9:49 AM    [facility]

## 2023-08-18 NOTE — Patient Instructions (Addendum)
Medication Instructions:  No changes *If you need a refill on your cardiac medications before your next appointment, please call your pharmacy*   Lab Work: None ordered If you have labs (blood work) drawn today and your tests are completely normal, you will receive your results only by: MyChart Message (if you have MyChart) OR A paper copy in the mail If you have any lab test that is abnormal or we need to change your treatment, we will call you to review the results.   Testing/Procedures: None ordered   Follow-Up: At [facility], you and your health needs are our priority.  As part of our continuing mission to provide you with exceptional heart care, we have created designated Provider Care Teams.  These Care Teams include your primary Cardiologist (physician) and Advanced Practice Providers (APPs -  Physician Assistants and Nurse Practitioners) who all work together to provide you with the care you need, when you need it.  We recommend signing up for the patient portal called "MyChart".  Sign up information is provided on this After Visit Summary.  MyChart is used to connect with patients for Virtual Visits (Telemedicine).  Patients are able to view lab/test results, encounter notes, upcoming appointments, etc.  Non-urgent messages can be sent to your provider as well.   To learn more about what you can do with MyChart, go to [email or web address].    Your next appointment:   12 month(s)  Provider:   Lorine Bears, MD        EXERCISE PROGRAM FOR INDIVIDUALS WITH  PERIPHERAL ARTERIAL DISEASE (PAD)   General Information:   Research in vascular exercise has demonstrated remarkable improvement in symptoms of leg pain (claudication) without expensive or invasive interventions. Regular walking programs are extremely helpful for patients with PAD and intermittent claudication.  These steps are designed to help you get started with a safe and effective program to help you  walk farther with less pain:   Walk at least three times a week (preferably every day).  Your goal is to build up to 30-45 minutes of total walking time (not counting rest breaks). It may take you several weeks to build up your exercise time starting at 5-10 minutes or whatever you can tolerate.  Walk as far as possible using moderate to maximal pain (7-8 on the scale below) as a signal to stop, and resume walking when the pain goes away.  On a treadmill, set the speed and grade at a level that brings on the claudication pain within 3 to 5 minutes. Walk at this rate until you experience claudication of moderate severity, rest until the pain improves, and then resume walking.  Over time, you will be able to walk longer at the designated speed and grade; workload should then be increased until you develop the pain within 3 to 5 minutes once again.  This regimen will induce a significant benefit. Studies have demonstrated that participants may be able to walk up to three or four times farther and have less leg pain, within twelve weeks, by following this protocol.  Pain Scale    0_____1_____2_____3_____4_____5_____6_____7_____8_____9_____10   No Pain                                   Moderate Pain  Maximal Pain

## 2023-08-26 ENCOUNTER — Other Ambulatory Visit: Payer: Self-pay | Admitting: Interventional Cardiology

## 2023-09-18 ENCOUNTER — Encounter: Payer: Self-pay | Admitting: Pulmonary Disease

## 2023-09-22 DIAGNOSIS — H35721 Serous detachment of retinal pigment epithelium, right eye: Secondary | ICD-10-CM | POA: Diagnosis not present

## 2023-09-22 DIAGNOSIS — H353132 Nonexudative age-related macular degeneration, bilateral, intermediate dry stage: Secondary | ICD-10-CM | POA: Diagnosis not present

## 2023-09-22 DIAGNOSIS — H353221 Exudative age-related macular degeneration, left eye, with active choroidal neovascularization: Secondary | ICD-10-CM | POA: Diagnosis not present

## 2023-10-05 DIAGNOSIS — M5416 Radiculopathy, lumbar region: Secondary | ICD-10-CM | POA: Diagnosis not present

## 2023-10-05 DIAGNOSIS — M48061 Spinal stenosis, lumbar region without neurogenic claudication: Secondary | ICD-10-CM | POA: Diagnosis not present

## 2023-10-05 DIAGNOSIS — I700 Atherosclerosis of aorta: Secondary | ICD-10-CM | POA: Diagnosis not present

## 2023-10-13 ENCOUNTER — Other Ambulatory Visit (HOSPITAL_COMMUNITY): Payer: Self-pay | Admitting: Cardiovascular Disease

## 2023-10-13 DIAGNOSIS — I739 Peripheral vascular disease, unspecified: Secondary | ICD-10-CM

## 2023-10-16 ENCOUNTER — Ambulatory Visit (HOSPITAL_BASED_OUTPATIENT_CLINIC_OR_DEPARTMENT_OTHER)
Admission: RE | Admit: 2023-10-16 | Discharge: 2023-10-16 | Disposition: A | Discharge status: Home / Self Care | Source: Ambulatory Visit | Attending: Internal Medicine | Admitting: Internal Medicine

## 2023-10-16 ENCOUNTER — Ambulatory Visit (HOSPITAL_COMMUNITY)
Admission: RE | Admit: 2023-10-16 | Discharge: 2023-10-16 | Disposition: A | Discharge status: Home / Self Care | Source: Ambulatory Visit | Attending: Internal Medicine | Admitting: Internal Medicine

## 2023-10-16 DIAGNOSIS — I739 Peripheral vascular disease, unspecified: Secondary | ICD-10-CM | POA: Insufficient documentation

## 2023-10-16 LAB — VAS US ABI WITH/WO TBI
Left ABI: 0.7
Right ABI: 0.96

## 2023-10-19 ENCOUNTER — Other Ambulatory Visit (HOSPITAL_COMMUNITY): Payer: Self-pay | Admitting: *Deleted

## 2023-10-19 DIAGNOSIS — I739 Peripheral vascular disease, unspecified: Secondary | ICD-10-CM

## 2023-11-17 DIAGNOSIS — H35721 Serous detachment of retinal pigment epithelium, right eye: Secondary | ICD-10-CM | POA: Diagnosis not present

## 2023-11-17 DIAGNOSIS — H353221 Exudative age-related macular degeneration, left eye, with active choroidal neovascularization: Secondary | ICD-10-CM | POA: Diagnosis not present

## 2023-11-17 DIAGNOSIS — H353132 Nonexudative age-related macular degeneration, bilateral, intermediate dry stage: Secondary | ICD-10-CM | POA: Diagnosis not present

## 2023-12-29 DIAGNOSIS — H35721 Serous detachment of retinal pigment epithelium, right eye: Secondary | ICD-10-CM | POA: Diagnosis not present

## 2023-12-29 DIAGNOSIS — H353221 Exudative age-related macular degeneration, left eye, with active choroidal neovascularization: Secondary | ICD-10-CM | POA: Diagnosis not present

## 2023-12-29 DIAGNOSIS — H353132 Nonexudative age-related macular degeneration, bilateral, intermediate dry stage: Secondary | ICD-10-CM | POA: Diagnosis not present

## 2024-01-04 ENCOUNTER — Other Ambulatory Visit: Payer: Self-pay | Admitting: Internal Medicine

## 2024-01-04 ENCOUNTER — Ambulatory Visit
Admission: RE | Admit: 2024-01-04 | Discharge: 2024-01-04 | Disposition: A | Discharge status: Home / Self Care | Source: Ambulatory Visit | Attending: Internal Medicine | Admitting: Internal Medicine

## 2024-01-04 DIAGNOSIS — Z1231 Encounter for screening mammogram for malignant neoplasm of breast: Secondary | ICD-10-CM | POA: Diagnosis not present

## 2024-01-04 DIAGNOSIS — E1122 Type 2 diabetes mellitus with diabetic chronic kidney disease: Secondary | ICD-10-CM | POA: Diagnosis not present

## 2024-01-04 DIAGNOSIS — I739 Peripheral vascular disease, unspecified: Secondary | ICD-10-CM | POA: Diagnosis not present

## 2024-01-04 DIAGNOSIS — I700 Atherosclerosis of aorta: Secondary | ICD-10-CM | POA: Diagnosis not present

## 2024-01-04 DIAGNOSIS — H353220 Exudative age-related macular degeneration, left eye, stage unspecified: Secondary | ICD-10-CM | POA: Diagnosis not present

## 2024-01-04 DIAGNOSIS — I25810 Atherosclerosis of coronary artery bypass graft(s) without angina pectoris: Secondary | ICD-10-CM | POA: Diagnosis not present

## 2024-01-04 DIAGNOSIS — N182 Chronic kidney disease, stage 2 (mild): Secondary | ICD-10-CM | POA: Diagnosis not present

## 2024-01-04 DIAGNOSIS — I10 Essential (primary) hypertension: Secondary | ICD-10-CM | POA: Diagnosis not present

## 2024-01-04 DIAGNOSIS — Z0000 Encounter for general adult medical examination without abnormal findings: Secondary | ICD-10-CM | POA: Diagnosis not present

## 2024-01-04 DIAGNOSIS — H3530 Unspecified macular degeneration: Secondary | ICD-10-CM | POA: Diagnosis not present

## 2024-01-11 DIAGNOSIS — Z961 Presence of intraocular lens: Secondary | ICD-10-CM | POA: Diagnosis not present

## 2024-01-11 DIAGNOSIS — H353221 Exudative age-related macular degeneration, left eye, with active choroidal neovascularization: Secondary | ICD-10-CM | POA: Diagnosis not present

## 2024-01-11 DIAGNOSIS — H353111 Nonexudative age-related macular degeneration, right eye, early dry stage: Secondary | ICD-10-CM | POA: Diagnosis not present

## 2024-01-27 DIAGNOSIS — H02833 Dermatochalasis of right eye, unspecified eyelid: Secondary | ICD-10-CM | POA: Diagnosis not present

## 2024-01-27 DIAGNOSIS — H02836 Dermatochalasis of left eye, unspecified eyelid: Secondary | ICD-10-CM | POA: Diagnosis not present

## 2024-01-27 DIAGNOSIS — H353132 Nonexudative age-related macular degeneration, bilateral, intermediate dry stage: Secondary | ICD-10-CM | POA: Diagnosis not present

## 2024-01-27 DIAGNOSIS — H35721 Serous detachment of retinal pigment epithelium, right eye: Secondary | ICD-10-CM | POA: Diagnosis not present

## 2024-01-27 DIAGNOSIS — H353221 Exudative age-related macular degeneration, left eye, with active choroidal neovascularization: Secondary | ICD-10-CM | POA: Diagnosis not present

## 2024-01-29 ENCOUNTER — Other Ambulatory Visit: Payer: Self-pay

## 2024-01-29 MED ORDER — ROSUVASTATIN CALCIUM 20 MG PO TABS: 20.0000 mg | ORAL_TABLET | Freq: Every day | ORAL | 2 refills | Status: AC

## 2024-03-08 DIAGNOSIS — H35721 Serous detachment of retinal pigment epithelium, right eye: Secondary | ICD-10-CM | POA: Diagnosis not present

## 2024-03-08 DIAGNOSIS — H353221 Exudative age-related macular degeneration, left eye, with active choroidal neovascularization: Secondary | ICD-10-CM | POA: Diagnosis not present

## 2024-03-08 DIAGNOSIS — H353132 Nonexudative age-related macular degeneration, bilateral, intermediate dry stage: Secondary | ICD-10-CM | POA: Diagnosis not present

## 2024-03-10 DIAGNOSIS — L821 Other seborrheic keratosis: Secondary | ICD-10-CM | POA: Diagnosis not present

## 2024-03-10 DIAGNOSIS — Z872 Personal history of diseases of the skin and subcutaneous tissue: Secondary | ICD-10-CM | POA: Diagnosis not present

## 2024-03-10 DIAGNOSIS — L578 Other skin changes due to chronic exposure to nonionizing radiation: Secondary | ICD-10-CM | POA: Diagnosis not present

## 2024-03-10 DIAGNOSIS — Z86018 Personal history of other benign neoplasm: Secondary | ICD-10-CM | POA: Diagnosis not present

## 2024-03-22 DIAGNOSIS — H35721 Serous detachment of retinal pigment epithelium, right eye: Secondary | ICD-10-CM | POA: Diagnosis not present

## 2024-03-22 DIAGNOSIS — H353132 Nonexudative age-related macular degeneration, bilateral, intermediate dry stage: Secondary | ICD-10-CM | POA: Diagnosis not present

## 2024-03-22 DIAGNOSIS — H353221 Exudative age-related macular degeneration, left eye, with active choroidal neovascularization: Secondary | ICD-10-CM | POA: Diagnosis not present

## 2024-04-18 DIAGNOSIS — H353132 Nonexudative age-related macular degeneration, bilateral, intermediate dry stage: Secondary | ICD-10-CM | POA: Diagnosis not present

## 2024-05-03 DIAGNOSIS — H353132 Nonexudative age-related macular degeneration, bilateral, intermediate dry stage: Secondary | ICD-10-CM | POA: Diagnosis not present

## 2024-05-03 DIAGNOSIS — H353221 Exudative age-related macular degeneration, left eye, with active choroidal neovascularization: Secondary | ICD-10-CM | POA: Diagnosis not present

## 2024-05-03 DIAGNOSIS — H35721 Serous detachment of retinal pigment epithelium, right eye: Secondary | ICD-10-CM | POA: Diagnosis not present

## 2024-05-03 DIAGNOSIS — H02833 Dermatochalasis of right eye, unspecified eyelid: Secondary | ICD-10-CM | POA: Diagnosis not present

## 2024-05-03 DIAGNOSIS — H02836 Dermatochalasis of left eye, unspecified eyelid: Secondary | ICD-10-CM | POA: Diagnosis not present

## 2024-09-20 ENCOUNTER — Ambulatory Visit: Admitting: Nurse Practitioner
